# Patient Record
Sex: Female | Born: 1955
Health system: Southern US, Community
[De-identification: ages and names within clinical notes are randomized; demographics above are authoritative.]

## PROBLEM LIST (undated history)

## (undated) DIAGNOSIS — G510 Bell's palsy: Secondary | ICD-10-CM

## (undated) DIAGNOSIS — E785 Hyperlipidemia, unspecified: Secondary | ICD-10-CM

## (undated) DIAGNOSIS — I639 Cerebral infarction, unspecified: Secondary | ICD-10-CM

## (undated) DIAGNOSIS — K8020 Calculus of gallbladder without cholecystitis without obstruction: Secondary | ICD-10-CM

## (undated) DIAGNOSIS — F32A Depression, unspecified: Secondary | ICD-10-CM

## (undated) DIAGNOSIS — M1990 Unspecified osteoarthritis, unspecified site: Secondary | ICD-10-CM

## (undated) DIAGNOSIS — I519 Heart disease, unspecified: Secondary | ICD-10-CM

## (undated) DIAGNOSIS — I10 Essential (primary) hypertension: Secondary | ICD-10-CM

## (undated) DIAGNOSIS — I2510 Atherosclerotic heart disease of native coronary artery without angina pectoris: Secondary | ICD-10-CM

## (undated) DIAGNOSIS — E119 Type 2 diabetes mellitus without complications: Secondary | ICD-10-CM

## (undated) DIAGNOSIS — G459 Transient cerebral ischemic attack, unspecified: Secondary | ICD-10-CM

## (undated) DIAGNOSIS — T7840XA Allergy, unspecified, initial encounter: Secondary | ICD-10-CM

## (undated) HISTORY — PX: CORONARY ANGIOPLASTY WITH STENT PLACEMENT: SHX49

## (undated) HISTORY — DX: Depression, unspecified: F32.A

## (undated) HISTORY — DX: Atherosclerotic heart disease of native coronary artery without angina pectoris: I25.10

## (undated) HISTORY — DX: Hyperlipidemia, unspecified: E78.5

## (undated) HISTORY — DX: Transient cerebral ischemic attack, unspecified: G45.9

## (undated) HISTORY — PX: CHOLECYSTECTOMY: SHX55

## (undated) HISTORY — DX: Heart disease, unspecified: I51.9

## (undated) HISTORY — PX: TONSILLECTOMY: SUR1361

## (undated) HISTORY — DX: Allergy, unspecified, initial encounter: T78.40XA

## (undated) HISTORY — DX: Bell's palsy: G51.0

## (undated) HISTORY — DX: Unspecified osteoarthritis, unspecified site: M19.90

## (undated) HISTORY — DX: Cerebral infarction, unspecified: I63.9

## (undated) HISTORY — DX: Calculus of gallbladder without cholecystitis without obstruction: K80.20

---

## 2016-06-30 DIAGNOSIS — G459 Transient cerebral ischemic attack, unspecified: Secondary | ICD-10-CM

## 2016-06-30 HISTORY — DX: Transient cerebral ischemic attack, unspecified: G45.9

## 2016-10-05 ENCOUNTER — Emergency Department (HOSPITAL_COMMUNITY): Payer: Self-pay

## 2016-10-05 ENCOUNTER — Encounter (HOSPITAL_COMMUNITY): Payer: Self-pay

## 2016-10-05 ENCOUNTER — Inpatient Hospital Stay (HOSPITAL_COMMUNITY): Payer: Self-pay

## 2016-10-05 ENCOUNTER — Inpatient Hospital Stay (HOSPITAL_COMMUNITY)
Admission: EM | Admit: 2016-10-05 | Discharge: 2016-10-07 | DRG: 066 | Disposition: A | Discharge status: Home / Self Care | Payer: Self-pay | Attending: Internal Medicine | Admitting: Internal Medicine

## 2016-10-05 DIAGNOSIS — E118 Type 2 diabetes mellitus with unspecified complications: Secondary | ICD-10-CM | POA: Diagnosis present

## 2016-10-05 DIAGNOSIS — R29701 NIHSS score 1: Secondary | ICD-10-CM | POA: Diagnosis present

## 2016-10-05 DIAGNOSIS — R531 Weakness: Secondary | ICD-10-CM | POA: Diagnosis present

## 2016-10-05 DIAGNOSIS — E669 Obesity, unspecified: Secondary | ICD-10-CM | POA: Diagnosis present

## 2016-10-05 DIAGNOSIS — I10 Essential (primary) hypertension: Secondary | ICD-10-CM | POA: Diagnosis present

## 2016-10-05 DIAGNOSIS — I6523 Occlusion and stenosis of bilateral carotid arteries: Secondary | ICD-10-CM

## 2016-10-05 DIAGNOSIS — I639 Cerebral infarction, unspecified: Secondary | ICD-10-CM | POA: Diagnosis present

## 2016-10-05 DIAGNOSIS — E1165 Type 2 diabetes mellitus with hyperglycemia: Secondary | ICD-10-CM | POA: Diagnosis present

## 2016-10-05 DIAGNOSIS — Z7984 Long term (current) use of oral hypoglycemic drugs: Secondary | ICD-10-CM

## 2016-10-05 DIAGNOSIS — H02402 Unspecified ptosis of left eyelid: Secondary | ICD-10-CM | POA: Diagnosis present

## 2016-10-05 DIAGNOSIS — E1151 Type 2 diabetes mellitus with diabetic peripheral angiopathy without gangrene: Secondary | ICD-10-CM | POA: Diagnosis present

## 2016-10-05 DIAGNOSIS — R270 Ataxia, unspecified: Secondary | ICD-10-CM | POA: Diagnosis present

## 2016-10-05 DIAGNOSIS — E785 Hyperlipidemia, unspecified: Secondary | ICD-10-CM | POA: Diagnosis present

## 2016-10-05 DIAGNOSIS — Z6832 Body mass index (BMI) 32.0-32.9, adult: Secondary | ICD-10-CM

## 2016-10-05 DIAGNOSIS — Z79899 Other long term (current) drug therapy: Secondary | ICD-10-CM

## 2016-10-05 DIAGNOSIS — R200 Anesthesia of skin: Secondary | ICD-10-CM

## 2016-10-05 DIAGNOSIS — Z955 Presence of coronary angioplasty implant and graft: Secondary | ICD-10-CM

## 2016-10-05 DIAGNOSIS — I6329 Cerebral infarction due to unspecified occlusion or stenosis of other precerebral arteries: Principal | ICD-10-CM | POA: Diagnosis present

## 2016-10-05 HISTORY — DX: Essential (primary) hypertension: I10

## 2016-10-05 HISTORY — DX: Type 2 diabetes mellitus without complications: E11.9

## 2016-10-05 LAB — I-STAT CHEM 8, ED
BUN: 14 mg/dL (ref 6–20)
CREATININE: 1.1 mg/dL — AB (ref 0.44–1.00)
Calcium, Ion: 1.1 mmol/L — ABNORMAL LOW (ref 1.15–1.40)
Chloride: 97 mmol/L — ABNORMAL LOW (ref 101–111)
GLUCOSE: 367 mg/dL — AB (ref 65–99)
HCT: 41 % (ref 36.0–46.0)
HEMOGLOBIN: 13.9 g/dL (ref 12.0–15.0)
Potassium: 3.9 mmol/L (ref 3.5–5.1)
Sodium: 136 mmol/L (ref 135–145)
TCO2: 29 mmol/L (ref 0–100)

## 2016-10-05 LAB — COMPREHENSIVE METABOLIC PANEL
ALT: 69 U/L — ABNORMAL HIGH (ref 14–54)
AST: 50 U/L — AB (ref 15–41)
Albumin: 4.2 g/dL (ref 3.5–5.0)
Alkaline Phosphatase: 94 U/L (ref 38–126)
Anion gap: 13 (ref 5–15)
BILIRUBIN TOTAL: 0.5 mg/dL (ref 0.3–1.2)
BUN: 12 mg/dL (ref 6–20)
CO2: 26 mmol/L (ref 22–32)
CREATININE: 1.1 mg/dL — AB (ref 0.44–1.00)
Calcium: 9.7 mg/dL (ref 8.9–10.3)
Chloride: 96 mmol/L — ABNORMAL LOW (ref 101–111)
GFR, EST NON AFRICAN AMERICAN: 53 mL/min — AB (ref >60)
Glucose, Bld: 357 mg/dL — ABNORMAL HIGH (ref 65–99)
POTASSIUM: 3.8 mmol/L (ref 3.5–5.1)
Sodium: 135 mmol/L (ref 135–145)
TOTAL PROTEIN: 7.8 g/dL (ref 6.5–8.1)

## 2016-10-05 LAB — CBC
HEMATOCRIT: 41 % (ref 36.0–46.0)
Hemoglobin: 13.9 g/dL (ref 12.0–15.0)
MCH: 31.7 pg (ref 26.0–34.0)
MCHC: 33.9 g/dL (ref 30.0–36.0)
MCV: 93.4 fL (ref 78.0–100.0)
Platelets: 261 10*3/uL (ref 150–400)
RBC: 4.39 MIL/uL (ref 3.87–5.11)
RDW: 13 % (ref 11.5–15.5)
WBC: 5.8 10*3/uL (ref 4.0–10.5)

## 2016-10-05 LAB — APTT: aPTT: 36 seconds (ref 24–36)

## 2016-10-05 LAB — DIFFERENTIAL
BASOS ABS: 0 10*3/uL (ref 0.0–0.1)
BASOS PCT: 0 %
EOS ABS: 0.2 10*3/uL (ref 0.0–0.7)
Eosinophils Relative: 3 %
LYMPHS ABS: 1.2 10*3/uL (ref 0.7–4.0)
Lymphocytes Relative: 20 %
MONO ABS: 0.3 10*3/uL (ref 0.1–1.0)
MONOS PCT: 6 %
Neutro Abs: 4.1 10*3/uL (ref 1.7–7.7)
Neutrophils Relative %: 71 %

## 2016-10-05 LAB — I-STAT TROPONIN, ED: TROPONIN I, POC: 0 ng/mL (ref 0.00–0.08)

## 2016-10-05 LAB — PROTIME-INR
INR: 1.03
Prothrombin Time: 13.6 seconds (ref 11.4–15.2)

## 2016-10-05 MED ORDER — SENNOSIDES-DOCUSATE SODIUM 8.6-50 MG PO TABS: 1.0000 | ORAL_TABLET | Freq: Every evening | ORAL | Status: DC | PRN

## 2016-10-05 MED ORDER — ACETAMINOPHEN 325 MG PO TABS: 650.0000 mg | ORAL_TABLET | ORAL | Status: DC | PRN

## 2016-10-05 MED ORDER — AMLODIPINE BESYLATE 5 MG PO TABS: 10.0000 mg | ORAL_TABLET | Freq: Every day | ORAL | Status: DC

## 2016-10-05 MED ORDER — SODIUM CHLORIDE 0.9 % IV SOLN
INTRAVENOUS | Status: DC
  Administered 2016-10-06: 02:00:00 via INTRAVENOUS

## 2016-10-05 MED ORDER — IOPAMIDOL (ISOVUE-370) INJECTION 76%
INTRAVENOUS | Status: AC
  Administered 2016-10-05: 50 mL
  Filled 2016-10-05: qty 50

## 2016-10-05 MED ORDER — LABETALOL HCL 5 MG/ML IV SOLN: 20.0000 mg | Freq: Once | INTRAVENOUS | Status: DC

## 2016-10-05 MED ORDER — ENOXAPARIN SODIUM 40 MG/0.4ML ~~LOC~~ SOLN
40.0000 mg | SUBCUTANEOUS | Status: DC
  Administered 2016-10-06 – 2016-10-07 (×2): 40 mg via SUBCUTANEOUS
  Filled 2016-10-05 (×2): qty 0.4

## 2016-10-05 MED ORDER — ASPIRIN 325 MG PO TABS
325.0000 mg | ORAL_TABLET | Freq: Every day | ORAL | Status: DC
  Administered 2016-10-06 – 2016-10-07 (×3): 325 mg via ORAL
  Filled 2016-10-05 (×3): qty 1

## 2016-10-05 MED ORDER — ATENOLOL 50 MG PO TABS
100.0000 mg | ORAL_TABLET | Freq: Every evening | ORAL | Status: DC
  Administered 2016-10-06: 100 mg via ORAL
  Filled 2016-10-05: qty 2
  Filled 2016-10-05: qty 4
  Filled 2016-10-05: qty 2

## 2016-10-05 MED ORDER — VALSARTAN-HYDROCHLOROTHIAZIDE 320-25 MG PO TABS: 1.0000 | ORAL_TABLET | Freq: Every day | ORAL | Status: DC

## 2016-10-05 MED ORDER — INSULIN ASPART 100 UNIT/ML ~~LOC~~ SOLN
0.0000 [IU] | SUBCUTANEOUS | Status: DC
  Administered 2016-10-06 (×2): 5 [IU] via SUBCUTANEOUS

## 2016-10-05 MED ORDER — LORAZEPAM 2 MG/ML IJ SOLN
1.0000 mg | Freq: Once | INTRAMUSCULAR | Status: DC
  Filled 2016-10-05: qty 1

## 2016-10-05 MED ORDER — STROKE: EARLY STAGES OF RECOVERY BOOK
Freq: Once | Status: AC
  Administered 2016-10-06: 02:00:00
  Filled 2016-10-05: qty 1

## 2016-10-05 MED ORDER — ASPIRIN 300 MG RE SUPP: 300.0000 mg | Freq: Every day | RECTAL | Status: DC

## 2016-10-05 MED ORDER — SODIUM CHLORIDE 0.9 % IV BOLUS (SEPSIS)
500.0000 mL | Freq: Once | INTRAVENOUS | Status: AC
  Administered 2016-10-05: 500 mL via INTRAVENOUS

## 2016-10-05 MED ORDER — ACETAMINOPHEN 160 MG/5ML PO SOLN: 650.0000 mg | ORAL | Status: DC | PRN

## 2016-10-05 MED ORDER — ACETAMINOPHEN 650 MG RE SUPP: 650.0000 mg | RECTAL | Status: DC | PRN

## 2016-10-05 MED ORDER — ATORVASTATIN CALCIUM 20 MG PO TABS
20.0000 mg | ORAL_TABLET | Freq: Every day | ORAL | Status: DC
  Administered 2016-10-06: 20 mg via ORAL
  Filled 2016-10-05: qty 2
  Filled 2016-10-05: qty 1

## 2016-10-05 NOTE — ED Provider Notes (Signed)
[facility] DEPT Provider Note   CSN: [id number] Arrival date & time: 10/05/16  1247     History   Chief Complaint Chief Complaint  Patient presents with  . Numbness    HPI Breanna Obrien is a 61 y.o. female.  The history is provided by the patient.  Neurologic Problem   new sudden onset of right-sided numbness that started in the right foot pain gradually ascended to the rest of her right hemibody. This started at 9:00 this morning and has progressively worsened. She reported that it started after she bent over to pick up something from the floor and stood back up. No alleviating or aggravating factors. Denies any associated headache, dizziness, blurry vision, chest pain, shortness of breath, nausea, vomiting. She does endorse difficulty ambulating because of the numbness.  Patient with a history of hypertension and diabetes who's been off of her medicine for several weeks due to relocating from [place].  She reports a history of Bell's palsy resulting in persistent left eye ptosis. No prior strokes.  Past Medical History:  Diagnosis Date  . Diabetes mellitus without complication ([place])   . Hypertension     Patient Active Problem List   Diagnosis Date Noted  . CVA (cerebral vascular accident) ([place]) 10/05/2016    Past Surgical History:  Procedure Laterality Date  . CHOLECYSTECTOMY    . CORONARY ANGIOPLASTY WITH STENT PLACEMENT      OB History    No data available       Home Medications    Prior to Admission medications   Medication Sig Start Date End Date Taking? Authorizing Provider  amLODipine (NORVASC) 10 MG tablet Take 10 mg by mouth daily.   Yes [provider]  atenolol (TENORMIN) 100 MG tablet Take 100 mg by mouth every evening.   Yes [provider]  dapagliflozin propanediol (FARXIGA) 10 MG TABS tablet Take 10 mg by mouth daily.   Yes [provider]  glipiZIDE (GLUCOTROL) 10 MG tablet Take 10 mg by mouth 2 (two) times  daily before a meal.   Yes [provider]  metFORMIN (GLUCOPHAGE) 1000 MG tablet Take 1,000 mg by mouth 2 (two) times daily with a meal.   Yes [provider]  valsartan-hydrochlorothiazide (DIOVAN-HCT) 320-25 MG tablet Take 1 tablet by mouth daily.   Yes [provider]    Family History No family history on file.  Social History Social History  Substance Use Topics  . Smoking status: Not on file  . Smokeless tobacco: Never Used  . Alcohol use Not on file     Allergies   Patient has no known allergies.   Review of Systems Review of Systems   Physical Exam Updated Vital Signs BP (!) 203/89   Pulse 95   Temp 98.2 F (36.8 C) (Oral)   Resp (!) 24   Ht 5\' 3"  (1.6 m)   Wt 186 lb (84.4 kg)   SpO2 95%   BMI 32.95 kg/m   Physical Exam  Constitutional: She is oriented to person, place, and time. She appears well-developed and well-nourished. No distress.  HENT:  Head: Normocephalic and atraumatic.  Nose: Nose normal.  Eyes: Conjunctivae and EOM are normal. Pupils are equal, round, and reactive to light. Right eye exhibits no discharge. Left eye exhibits no discharge. No scleral icterus.  Ptosis of left eye (baseline, due to prior Bell's Palsy)  Neck: Normal range of motion. Neck supple.  Cardiovascular: Normal rate and regular rhythm.  Exam reveals no  gallop and no friction rub.   No murmur heard. Pulmonary/Chest: Effort normal and breath sounds normal. No stridor. No respiratory distress. She has no rales.  Abdominal: Soft. She exhibits no distension. There is no tenderness.  Musculoskeletal: She exhibits no edema or tenderness.  Neurological: She is alert and oriented to person, place, and time.  Mental Status: Alert and oriented to person, place, and time. Attention and concentration normal. Speech clear. Recent memory is intact  Cranial Nerves  II Visual Fields: Intact to confrontation. Visual fields intact. III, IV, VI: Pupils equal and  reactive to light and near. Full eye movement without nystagmus  V Facial Sensation: Normal. No weakness of masticatory muscles  VII: No facial weakness or asymmetry  VIII Auditory Acuity: Grossly normal  IX/X: The uvula is midline; the palate elevates symmetrically  XI: Normal sternocleidomastoid and trapezius strength  XII: The tongue is midline. No atrophy or fasciculations.   Motor System: Muscle Strength: 5/5 and symmetric in the upper and lower extremities. No pronation or drift.  Muscle Tone: Tone and muscle bulk are normal in the upper and lower extremities.   Reflexes: DTRs: 2+ and symmetrical in all four extremities. Plantar responses are flexor bilaterally.  Coordination: mild dysmetria with finger-to-nose. Intact heel-to-shin. No tremor.  Sensation:decreased sensation on right side of body to light touch, and pinprick  Positive Romberg test.  Gait: ataxic   Skin: Skin is warm and dry. No rash noted. She is not diaphoretic. No erythema.  Psychiatric: She has a normal mood and affect.  Vitals reviewed.    ED Treatments / Results  Labs (all labs ordered are listed, but only abnormal results are displayed) Labs Reviewed  COMPREHENSIVE METABOLIC PANEL - Abnormal; Notable for the following:       Result Value   Chloride 96 (*)    Glucose, Bld 357 (*)    Creatinine, Ser 1.10 (*)    AST 50 (*)    ALT 69 (*)    GFR calc non Af Amer 53 (*)    All other components within normal limits  I-STAT CHEM 8, ED - Abnormal; Notable for the following:    Chloride 97 (*)    Creatinine, Ser 1.10 (*)    Glucose, Bld 367 (*)    Calcium, Ion 1.10 (*)    All other components within normal limits  PROTIME-INR  APTT  CBC  DIFFERENTIAL  HEMOGLOBIN A1C  LIPID PANEL  HIV ANTIBODY (ROUTINE TESTING)  CBC  I-STAT TROPOININ, ED  CBG MONITORING, ED    EKG  EKG Interpretation  Date/Time:  Thursday Oct 05 2016 13:07:03 EDT Ventricular Rate:  79 PR Interval:  170 QRS Duration: 92 QT  Interval:  408 QTC Calculation: 467 R Axis:   9 Text Interpretation:  Normal sinus rhythm Abnormal ECG Nonspecific T wave abnormality no stemi    Confirmed by [facility] MD, PEDRO ([id number]) on 10/05/2016 4:29:27 PM       Radiology Ct Angio Head W Or Wo Contrast  Result Date: 10/05/2016 CLINICAL DATA:  Right-sided leg numbness EXAM: CT ANGIOGRAPHY HEAD AND NECK TECHNIQUE: Multidetector CT imaging of the head and neck was performed using the standard protocol during bolus administration of intravenous contrast. Multiplanar CT image reconstructions and MIPs were obtained to evaluate the vascular anatomy. Carotid stenosis measurements (when applicable) are obtained utilizing NASCET criteria, using the distal internal carotid diameter as the denominator. CONTRAST:  50 mL Isovue 370 COMPARISON:  Head CT 10/05/2016 FINDINGS: CTA NECK FINDINGS Aortic arch:  There is no aneurysm or dissection of the visualized ascending aorta or aortic arch. There is a normal 3 vessel branching pattern. The visualized proximal subclavian arteries are normal. There is aortic arch atherosclerosis. Right carotid system: The right common carotid origin is widely patent. There is no common carotid or internal carotid artery dissection or aneurysm. Atherosclerotic calcification of the right carotid bifurcation results in approximately 60% proximal right ICA stenosis. Left carotid system: The origin of the left common carotid artery in the remainder of the common carotid course are normal. At the carotid bifurcation, there is atherosclerotic calcification the results in less than 50% stenosis. No dissection or aneurysm. Vertebral arteries: The vertebral system is codominant. Both vertebral artery origins are normal. Both vertebral arteries are normal to their confluence with the basilar artery. Skeleton: There is no bony spinal canal stenosis. No lytic or blastic lesions. Other neck: The nasopharynx is clear. The oropharynx and hypopharynx are  normal. The epiglottis is normal. The supraglottic larynx, glottis and subglottic larynx are normal. No retropharyngeal collection. The parapharyngeal spaces are preserved. The parotid and submandibular glands are normal. No sialolithiasis or salivary ductal dilatation. The thyroid gland is normal. There is no cervical lymphadenopathy. Upper chest: No pneumothorax or pleural effusion. No nodules or masses. Review of the MIP images confirms the above findings CTA HEAD FINDINGS Anterior circulation: --Intracranial internal carotid arteries: There is atherosclerotic calcification of both lacerum and cavernous segments without advanced stenosis. 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 The distal aspect of the vessel is diminutive and not clearly visualized. --Anterior cerebral arteries: Normal. --Middle cerebral arteries: Normal. --Posterior communicating arteries: There is a small P-comm present on the left. Posterior circulation: --Posterior cerebral arteries: The left posterior cerebral artery is severely diminutive. The right is normal. --Superior cerebellar arteries: Normal on the right. Diminutive on the left. --Basilar artery: Normal. --Anterior inferior cerebellar arteries: Normal. --Posterior inferior cerebellar arteries: Normal. Venous sinuses: As permitted by contrast timing, patent. 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 Delayed phase: No parenchymal contrast enhancement. Review of the MIP images confirms the above findings IMPRESSION: 1. No emergent large vessel occlusion. 2. Approximately 60% stenosis of the proximal right internal carotid artery secondary to atherosclerotic calcification. 3. Very diminutive left posterior cerebral artery arising from a small posterior communicating artery. 4.  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 A diminutive vessel appears to arise from the apex, but connection to the basilar artery is not visualized. Electronically Signed   By: Ulyses Jarred M.D.   On: 10/05/2016 21:02   Ct Head Wo Contrast  Result Date: 10/05/2016 CLINICAL DATA:  Right-sided weakness and numbness. Tingling in the arm and hand. Symptoms began at 9 o'clock a.m. today. EXAM: CT HEAD WITHOUT CONTRAST TECHNIQUE: Contiguous axial images were obtained from the base of the skull through the vertex without intravenous contrast. COMPARISON:  None. FINDINGS: Brain: A remote white matter infarct is noted adjacent to the frontal horn of the right lateral ventricle. This involves the anterior aspect of the corpus callosum as well. No other acute or focal infarct is present. No discrete cortical lesions are noted. The basal ganglia are intact. The insular ribbon is normal. Ventricles are of normal size. No significant  extra-axial fluid collection is present. Vascular: Atherosclerotic calcifications are present within the cavernous internal carotid arteries bilaterally. There is no hyperdense vessel. Skull: The calvarium is intact. No focal lytic or blastic lesions are present. Sinuses/Orbits: The paranasal sinuses and mastoid air cells are clear. The globes and orbits are within normal limits. IMPRESSION: 1. 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 2. Otherwise negative CT of the head. Electronically Signed   By: San Morelle M.D.   On: 10/05/2016 14:42   Ct Angio Neck W Or Wo Contrast  Result Date: 10/05/2016 CLINICAL DATA:  Right-sided leg numbness EXAM: CT ANGIOGRAPHY HEAD AND NECK TECHNIQUE: Multidetector CT imaging of the head and neck was performed using the standard protocol during bolus administration of  intravenous contrast. Multiplanar CT image reconstructions and MIPs were obtained to evaluate the vascular anatomy. Carotid stenosis measurements (when applicable) are obtained utilizing NASCET criteria, using the distal internal carotid diameter as the denominator. CONTRAST:  50 mL Isovue 370 COMPARISON:  Head CT 10/05/2016 FINDINGS: CTA NECK FINDINGS Aortic arch: There is no aneurysm or dissection of the visualized ascending aorta or aortic arch. There is a normal 3 vessel branching pattern. The visualized proximal subclavian arteries are normal. There is aortic arch atherosclerosis. Right carotid system: The right common carotid origin is widely patent. There is no common carotid or internal carotid artery dissection or aneurysm. Atherosclerotic calcification of the right carotid bifurcation results in approximately 60% proximal right ICA stenosis. Left carotid system: The origin of the left common carotid artery in the remainder of the common carotid course are normal. At the carotid bifurcation, there is atherosclerotic calcification the results in less than 50% stenosis. No dissection or aneurysm. Vertebral arteries: The vertebral system is codominant. Both vertebral artery origins are normal. Both vertebral arteries are normal to their confluence with the basilar artery. Skeleton: There is no bony spinal canal stenosis. No lytic or blastic lesions. Other neck: The nasopharynx is clear. The oropharynx and hypopharynx are normal. The epiglottis is normal. The supraglottic larynx, glottis and subglottic larynx are normal. No retropharyngeal collection. The parapharyngeal spaces are preserved. The parotid and submandibular glands are normal. No sialolithiasis or salivary ductal dilatation. The thyroid gland is normal. There is no cervical lymphadenopathy. Upper chest: No pneumothorax or pleural effusion. No nodules or masses. Review of the MIP images confirms the above findings CTA HEAD FINDINGS Anterior  circulation: --Intracranial internal carotid arteries: There is atherosclerotic calcification of both lacerum and cavernous segments without advanced stenosis. 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 The distal aspect of the vessel is diminutive and not clearly visualized. --Anterior cerebral arteries: Normal. --Middle cerebral arteries: Normal. --Posterior communicating arteries: There is a small P-comm present on the left. Posterior circulation: --Posterior cerebral arteries: The left posterior cerebral artery is severely diminutive. The right is normal. --Superior cerebellar arteries: Normal on the right. Diminutive on the left. --Basilar artery: Normal. --Anterior inferior cerebellar arteries: Normal. --Posterior inferior cerebellar arteries: Normal. Venous sinuses: As permitted by contrast timing, patent. 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 Delayed phase: No parenchymal contrast enhancement. Review of the MIP images confirms the above findings IMPRESSION: 1. No emergent large vessel occlusion. 2. Approximately 60% stenosis of the proximal right internal carotid artery secondary  to atherosclerotic calcification. 3. Very diminutive left posterior cerebral artery arising from a small posterior communicating artery. 4. 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 A diminutive vessel appears to arise from the apex, but connection to the basilar artery is not visualized. Electronically Signed   By: Ulyses Jarred M.D.   On: 10/05/2016 21:02    Procedures Procedures (including critical care time)  Medications Ordered in ED Medications  valsartan-hydrochlorothiazide (DIOVAN-HCT) 320-25 MG per tablet 1  tablet (not administered)  amLODipine (NORVASC) tablet 10 mg (not administered)  atenolol (TENORMIN) tablet 100 mg (not administered)   stroke: mapping our early stages of recovery book (not administered)  0.9 %  sodium chloride infusion (not administered)  acetaminophen (TYLENOL) tablet 650 mg (not administered)    Or  acetaminophen (TYLENOL) solution 650 mg (not administered)    Or  acetaminophen (TYLENOL) suppository 650 mg (not administered)  senna-docusate (Senokot-S) tablet 1 tablet (not administered)  enoxaparin (LOVENOX) injection 40 mg (not administered)  aspirin suppository 300 mg (not administered)    Or  aspirin tablet 325 mg (not administered)  atorvastatin (LIPITOR) tablet 20 mg (not administered)  sodium chloride 0.9 % bolus 500 mL (500 mLs Intravenous New Bag/Given 10/05/16 1843)  iopamidol (ISOVUE-370) 76 % injection (50 mLs  Contrast Given 10/05/16 2006)     Initial Impression / Assessment and Plan / ED Course  I have reviewed the triage vital signs and the nursing notes.  Pertinent labs & imaging results that were available during my care of the patient were reviewed by me and considered in my medical decision making (see chart for details).     Concerning for Posterior circulation deficiency vs thalamic stroke. Discussed case with neurology who agreed that given her lack of deficits, a code stroke was not necessary at this time. CT of the patient's head did reveal remote infarct of the frontal horn of the right lateral ventricle. Screening labs with hyperglycemia but no evidence of DKA. Mild renal insufficiency. No electrolyte derangements. Other labs grossly reassuring.  Patient is noted to be hypertensive with systolics in the 062I, that are gradually improving without intervention.  Obtaining a CTA of the head and neck to assess posterior circulation. Neurology recommended MRI.   Patient does not have vertiginous symptoms that would be consistent with BPPV  causing her ataxia.  Admitted to medicine for further work up and management.  Final Clinical Impressions(s) / ED Diagnoses   Final diagnoses:  Ataxia  Numbness     Cardama, Grayce Sessions, MD 10/07/16 [phone]

## 2016-10-05 NOTE — ED Notes (Signed)
Report called to floor. Informed receiving RN that pt is currently in MRI and will be transported when pt returns from MRI.

## 2016-10-05 NOTE — H&P (Addendum)
History and Physical    Breanna Obrien XNA:[id number] DOB: 08-18-1955 DOA: 10/05/2016  Referring MD/NP/PA: Dr. Leonette Monarch PCP: No primary care provider on file.  Patient coming from: Home  Chief Complaint: Right-sided numbness  HPI: Breanna Obrien is a 61 y.o. female with medical history significant of HTN, DM type II, Lt sided bell's palsy; who presents with complaints of right-sided numbness since this morning at approximately 9 AM. Patient reports getting out of bed and after bending over to pick something off the floor feeling as though her right leg was sleep. She's tried stopping the affected leg on the floor without change in symptoms. As time progressed patient noted that her whole right side felt numb which was unusual for her. Associated symptoms included feeling off balance and feeling as though she is limping on her right side. Denies having any significant weakness, slurred speech, headache, change in vision, chest pain, palpitations, lightheadedness, fall, fever, shortness of breath chills, nausea, vomiting, or diarrhea. Patient notes that blood sugars have been intermittently elevated at home into the 190s. While checking her blood pressures at home she noted her systolic blood pressures were elevated into the 170s which is unusual for her.  ED Course: Upon admission to the emergency department patient was noted to have a blood pressures elevated to 203/89. Initial CT imaging did not show any acute signs of stroke. Patient was not a candidate for TPA.  Review of Systems: As per HPI otherwise 10 point review of systems negative.   Past Medical History:  Diagnosis Date  . Diabetes mellitus without complication ([place])   . Hypertension     Past Surgical History:  Procedure Laterality Date  . CHOLECYSTECTOMY    . CORONARY ANGIOPLASTY WITH STENT PLACEMENT       does not have a smoking history on file. She has never used smokeless tobacco. Her alcohol and drug histories are not on  file.  No Known Allergies  No family history on file.  Prior to Admission medications   Medication Sig Start Date End Date Taking? Authorizing Provider  amLODipine (NORVASC) 10 MG tablet Take 10 mg by mouth daily.   Yes [provider]  atenolol (TENORMIN) 100 MG tablet Take 100 mg by mouth every evening.   Yes [provider]  dapagliflozin propanediol (FARXIGA) 10 MG TABS tablet Take 10 mg by mouth daily.   Yes [provider]  glipiZIDE (GLUCOTROL) 10 MG tablet Take 10 mg by mouth 2 (two) times daily before a meal.   Yes [provider]  metFORMIN (GLUCOPHAGE) 1000 MG tablet Take 1,000 mg by mouth 2 (two) times daily with a meal.   Yes [provider]  valsartan-hydrochlorothiazide (DIOVAN-HCT) 320-25 MG tablet Take 1 tablet by mouth daily.   Yes [provider]    Physical Exam:   Constitutional: Overweight female in NAD, calm, comfortable Vitals:   10/05/16 2100 10/05/16 2115 10/05/16 2130 10/05/16 2145  BP: (!) 152/61   (!) 150/64  Pulse: 74 77 78 79  Resp: 16 15 14 17   Temp:      TempSrc:      SpO2: 96% 97% 98% 97%  Weight:      Height:       Eyes: Left eye ptosis. Right eye normal extraocular movements. Sclerae are anicteric. ENMT: Mucous membranes are moist. Posterior pharynx clear of any exudate or lesions.Normal dentition.  Neck: normal, supple, no masses, no thyromegaly Respiratory: clear to auscultation bilaterally, no wheezing, no crackles. Normal respiratory effort.  No accessory muscle use.  Cardiovascular: Regular rate and rhythm, no murmurs / rubs / gallops. No extremity edema. 2+ pedal pulses. No carotid bruits.  Abdomen: no tenderness, no masses palpated. No hepatosplenomegaly. Bowel sounds positive.  Musculoskeletal: no clubbing / cyanosis. No joint deformity upper and lower extremities. Good ROM, no contractures. Normal muscle tone.  Skin: no rashes, lesions, ulcers. No induration Neurologic: CN 2-12  grossly intact. Sensation Abnormal on the right side, DTR normal. Strength 5/5 in all 4. Facial drooping noted on the left with left eye ptosis. Psychiatric: Normal judgment and insight. Alert and oriented x 3. Normal mood.     Labs on Admission: I have personally reviewed following labs and imaging studies  CBC:  Recent Labs Lab 10/05/16 1203 10/05/16 1337  WBC 5.8  --   NEUTROABS 4.1  --   HGB 13.9 13.9  HCT 41.0 41.0  MCV 93.4  --   PLT 261  --    Basic Metabolic Panel:  Recent Labs Lab 10/05/16 1203 10/05/16 1337  NA 135 136  K 3.8 3.9  CL 96* 97*  CO2 26  --   GLUCOSE 357* 367*  BUN 12 14  CREATININE 1.10* 1.10*  CALCIUM 9.7  --    GFR: Estimated Creatinine Clearance: 56 mL/min (A) (by C-G formula based on SCr of 1.1 mg/dL (H)). Liver Function Tests:  Recent Labs Lab 10/05/16 1203  AST 50*  ALT 69*  ALKPHOS 94  BILITOT 0.5  PROT 7.8  ALBUMIN 4.2   No results for input(s): LIPASE, AMYLASE in the last 168 hours. No results for input(s): AMMONIA in the last 168 hours. Coagulation Profile:  Recent Labs Lab 10/05/16 1203  INR 1.03   Cardiac Enzymes: No results for input(s): CKTOTAL, CKMB, CKMBINDEX, TROPONINI in the last 168 hours. BNP (last 3 results) No results for input(s): PROBNP in the last 8760 hours. HbA1C: No results for input(s): HGBA1C in the last 72 hours. CBG: No results for input(s): GLUCAP in the last 168 hours. Lipid Profile: No results for input(s): CHOL, HDL, LDLCALC, TRIG, CHOLHDL, LDLDIRECT in the last 72 hours. Thyroid Function Tests: No results for input(s): TSH, T4TOTAL, FREET4, T3FREE, THYROIDAB in the last 72 hours. Anemia Panel: No results for input(s): VITAMINB12, FOLATE, FERRITIN, TIBC, IRON, RETICCTPCT in the last 72 hours. Urine analysis: No results found for: COLORURINE, APPEARANCEUR, LABSPEC, PHURINE, GLUCOSEU, HGBUR, BILIRUBINUR, KETONESUR, PROTEINUR, UROBILINOGEN, NITRITE, LEUKOCYTESUR Sepsis Labs: No results  found for this or any previous visit (from the past 240 hour(s)).   Radiological Exams on Admission: Ct Angio Head W Or Wo Contrast  Result Date: 10/05/2016 CLINICAL DATA:  Right-sided leg numbness EXAM: CT ANGIOGRAPHY HEAD AND NECK TECHNIQUE: Multidetector CT imaging of the head and neck was performed using the standard protocol during bolus administration of intravenous contrast. Multiplanar CT image reconstructions and MIPs were obtained to evaluate the vascular anatomy. Carotid stenosis measurements (when applicable) are obtained utilizing NASCET criteria, using the distal internal carotid diameter as the denominator. CONTRAST:  50 mL Isovue 370 COMPARISON:  Head CT 10/05/2016 FINDINGS: CTA NECK FINDINGS Aortic arch: There is no aneurysm or dissection of the visualized ascending aorta or aortic arch. There is a normal 3 vessel branching pattern. The visualized proximal subclavian arteries are normal. There is aortic arch atherosclerosis. Right carotid system: The right common carotid origin is widely patent. There is no common carotid or internal carotid artery dissection or aneurysm. Atherosclerotic calcification of the right carotid bifurcation results in approximately 60% proximal  right ICA stenosis. Left carotid system: The origin of the left common carotid artery in the remainder of the common carotid course are normal. At the carotid bifurcation, there is atherosclerotic calcification the results in less than 50% stenosis. No dissection or aneurysm. Vertebral arteries: The vertebral system is codominant. Both vertebral artery origins are normal. Both vertebral arteries are normal to their confluence with the basilar artery. Skeleton: There is no bony spinal canal stenosis. No lytic or blastic lesions. Other neck: The nasopharynx is clear. The oropharynx and hypopharynx are normal. The epiglottis is normal. The supraglottic larynx, glottis and subglottic larynx are normal. No retropharyngeal  collection. The parapharyngeal spaces are preserved. The parotid and submandibular glands are normal. No sialolithiasis or salivary ductal dilatation. The thyroid gland is normal. There is no cervical lymphadenopathy. Upper chest: No pneumothorax or pleural effusion. No nodules or masses. Review of the MIP images confirms the above findings CTA HEAD FINDINGS Anterior circulation: --Intracranial internal carotid arteries: There is atherosclerotic calcification of both lacerum and cavernous segments without advanced stenosis. 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 The distal aspect of the vessel is diminutive and not clearly visualized. --Anterior cerebral arteries: Normal. --Middle cerebral arteries: Normal. --Posterior communicating arteries: There is a small P-comm present on the left. Posterior circulation: --Posterior cerebral arteries: The left posterior cerebral artery is severely diminutive. The right is normal. --Superior cerebellar arteries: Normal on the right. Diminutive on the left. --Basilar artery: Normal. --Anterior inferior cerebellar arteries: Normal. --Posterior inferior cerebellar arteries: Normal. Venous sinuses: As permitted by contrast timing, patent. 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 Delayed phase: No parenchymal contrast enhancement. Review of the MIP images confirms the above findings IMPRESSION: 1. No emergent large vessel occlusion. 2. Approximately 60% stenosis of the proximal right internal carotid artery secondary to atherosclerotic calcification. 3. Very diminutive left posterior cerebral artery arising from a small posterior communicating artery. 4. 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 A diminutive vessel appears to arise from the apex, but connection to the basilar artery is not visualized. Electronically Signed   By: Ulyses Jarred M.D.   On: 10/05/2016 21:02   Ct Head Wo Contrast  Result Date: 10/05/2016 CLINICAL DATA:  Right-sided weakness and numbness. Tingling in the arm and hand. Symptoms began at 9 o'clock a.m. today. EXAM: CT HEAD WITHOUT CONTRAST TECHNIQUE: Contiguous axial images were obtained from the base of the skull through the vertex without intravenous contrast. COMPARISON:  None. FINDINGS: Brain: A remote white matter infarct is noted adjacent to the frontal horn of the right lateral ventricle. This involves the anterior aspect of the corpus callosum as well. No other acute or focal infarct is present. No discrete cortical lesions are noted. The basal ganglia are intact. The insular ribbon is normal. Ventricles are of normal size. No significant extra-axial fluid collection is present. Vascular: Atherosclerotic calcifications are present within the cavernous internal carotid arteries bilaterally. There is no hyperdense vessel. Skull: The calvarium is intact. No focal lytic or blastic lesions are present. Sinuses/Orbits: The paranasal sinuses and mastoid air cells are clear. The globes and orbits are within normal limits. IMPRESSION: 1. 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 2. Otherwise negative CT of the head. Electronically Signed   By: San Morelle M.D.   On: 10/05/2016 14:42   Ct Angio Neck W Or Wo Contrast  Result Date: 10/05/2016 CLINICAL DATA:  Right-sided leg numbness EXAM: CT ANGIOGRAPHY HEAD AND NECK TECHNIQUE: Multidetector CT imaging of the head and neck was performed using the standard protocol during bolus administration of intravenous contrast. Multiplanar CT image reconstructions and MIPs were obtained to evaluate the vascular anatomy. Carotid  stenosis measurements (when applicable) are obtained utilizing NASCET criteria, using the distal internal carotid diameter as the denominator. CONTRAST:  50 mL Isovue 370 COMPARISON:  Head CT 10/05/2016 FINDINGS: CTA NECK FINDINGS Aortic arch: There is no aneurysm or dissection of the visualized ascending aorta or aortic arch. There is a normal 3 vessel branching pattern. The visualized proximal subclavian arteries are normal. There is aortic arch atherosclerosis. Right carotid system: The right common carotid origin is widely patent. There is no common carotid or internal carotid artery dissection or aneurysm. Atherosclerotic calcification of the right carotid bifurcation results in approximately 60% proximal right ICA stenosis. Left carotid system: The origin of the left common carotid artery in the remainder of the common carotid course are normal. At the carotid bifurcation, there is atherosclerotic calcification the results in less than 50% stenosis. No dissection or aneurysm. Vertebral arteries: The vertebral system is codominant. Both vertebral artery origins are normal. Both vertebral arteries are normal to their confluence with the basilar artery. Skeleton: There is no bony spinal canal stenosis. No lytic or blastic lesions. Other neck: The nasopharynx is clear. The oropharynx and hypopharynx are normal. The epiglottis is normal. The supraglottic larynx, glottis and subglottic larynx are normal. No retropharyngeal collection. The parapharyngeal spaces are preserved. The parotid and submandibular glands are normal. No sialolithiasis or salivary ductal dilatation. The thyroid gland is normal. There is no cervical lymphadenopathy. Upper chest: No pneumothorax or pleural effusion. No nodules or masses. Review of the MIP images confirms the above findings CTA HEAD FINDINGS Anterior circulation: --Intracranial internal carotid arteries: There is atherosclerotic calcification of both lacerum and cavernous segments  without advanced stenosis. 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 The distal aspect of the vessel is diminutive and not clearly visualized. --Anterior cerebral arteries: Normal. --Middle cerebral arteries: Normal. --Posterior communicating arteries: There is a small P-comm present on the left. Posterior circulation: --Posterior cerebral arteries: The left posterior cerebral artery is severely diminutive. The right is normal. --Superior cerebellar arteries: Normal on the right. Diminutive on the left. --Basilar artery: Normal. --Anterior inferior cerebellar arteries: Normal. --Posterior inferior cerebellar arteries: Normal. Venous sinuses: As permitted by contrast timing, patent. 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 Delayed phase: No parenchymal contrast enhancement. Review of the MIP images confirms the above findings IMPRESSION: 1. No emergent large vessel occlusion. 2. Approximately 60% stenosis of the proximal right internal carotid artery secondary to atherosclerotic calcification. 3. Very diminutive left posterior cerebral artery arising from a small posterior communicating artery. 4. 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 A diminutive vessel appears to arise from the apex, but connection to the basilar artery is not visualized. Electronically Signed   By: Cletus Gash.D.  On: 10/05/2016 21:02    EKG: Independently reviewed. Normal sinus rhythm  Assessment/Plan Suspected CVA with right-sided numbness: Acute. Patient reports waking up at 9 AM with right-sided numbness denies any significant weakness. - Admit to telemetry bed - Stroke order set initiated - Neuro checks - Check  MRI/MRA Head and neck w/ w/o contrast - PT/OT/  eval and treat - Check echocardiogram - Check Hemoglobin A1c and lipid panel in a.m. - ASA and atorvastatin - Appreciate neurology consultative services, will follow-up for further recommendations - Social work consult   Diabetes mellitus type 2: Poorly controlled initial blood glucose 357 on admission. No hemoglobin A1c on file. - Hypoglycemic protocol - Hold glipizide, Farxigaand metformin - CBGs every 4 hours with moderate sliding scale insulin.   Essential hypertension - Continue amlodipine, atenolol, and Diovan in a.m.    DVT prophylaxis: lovenox Code Status: Full  Family Communication: No family present at bedside Disposition Plan: TBD  Consults called: Neurology  Admission status: Inpatient  Norval Morton MD [facility] Pager [phone]  If 7PM-7AM, please contact night-coverage [email or web address] Password [facility]  10/05/2016, 10:33 PM

## 2016-10-05 NOTE — Consult Note (Signed)
Requesting Physician: Dr. Leonette Monarch Time consult called: 4:30 PM    Chief Complaint: R numbness ( face, arm, and leg)  History obtained from:  Patient     HPI:                                                                                                                                         Breanna Obrien is an 61 y.o. female with PMH significant for HTN, DM prior L Bell's palsy with residual L ptosis and facial drrp who presnted to the hospital complaning of R sided numbness more in the leg but including the etire R side, symptoms started this m,orning around 9AM, pt deni3ed any weakness, no slurred speech, no visual changes. Neurology consult was called to evaluate the pt for possible stroke. In ED [facility] was normal Pt denied headaches, N/V or any pain.  Date last known well: 10/05/2016 Time last known well: 9 AM tPA Given: NO, outside the window, NIHss 1   Modified Rankin: 0    Past Medical History:  Diagnosis Date  . Diabetes mellitus without complication ([place])   . Hypertension     Past Surgical History:  Procedure Laterality Date  . CHOLECYSTECTOMY    . CORONARY ANGIOPLASTY WITH STENT PLACEMENT      No family history on file. Social History:  does not have a smoking history on file. She has never used smokeless tobacco. Her alcohol and drug histories are not on file.  Allergies: No Known Allergies  Medications:                                                                                                                           {medication reviewed by me   ROS:  ROS: as noted in HPI  Neurologic Examination:                                                                                                      Blood pressure (!) 203/89, pulse 95, temperature 98.4 F (36.9 C), resp. rate (!) 24, height 5\' 3"  (1.6 m), weight 84.4 kg  (186 lb), SpO2 95 %.  Physical Exam:  Heart: Normal rate and regular rhythm.  Pulmonary/Chest: CTA Abdominal: Soft. There is no tenderness. There is no rebound and no guarding.   Neuro: NIHss 1 Awake alert oriented X3, follows commands, normal visual fields,L ptosis "old", L facial droop "old", PERRLA, normal speech and language, motor 5/5  Sensory: decreased to all modalities on R no ataxia      Lab Results: Basic Metabolic Panel:  Recent Labs Lab 10/05/16 1203 10/05/16 1337  NA 135 136  K 3.8 3.9  CL 96* 97*  CO2 26  --   GLUCOSE 357* 367*  BUN 12 14  CREATININE 1.10* 1.10*  CALCIUM 9.7  --     Liver Function Tests:  Recent Labs Lab 10/05/16 1203  AST 50*  ALT 69*  ALKPHOS 94  BILITOT 0.5  PROT 7.8  ALBUMIN 4.2   No results for input(s): LIPASE, AMYLASE in the last 168 hours. No results for input(s): AMMONIA in the last 168 hours.  CBC:  Recent Labs Lab 10/05/16 1203 10/05/16 1337  WBC 5.8  --   NEUTROABS 4.1  --   HGB 13.9 13.9  HCT 41.0 41.0  MCV 93.4  --   PLT 261  --     Cardiac Enzymes: No results for input(s): CKTOTAL, CKMB, CKMBINDEX, TROPONINI in the last 168 hours.  Lipid Panel: No results for input(s): CHOL, TRIG, HDL, CHOLHDL, VLDL, LDLCALC in the last 168 hours.  CBG: No results for input(s): GLUCAP in the last 168 hours.  Microbiology: No results found for this or any previous visit.  Coagulation Studies:  Recent Labs  10/05/16 1203  LABPROT 13.6  INR 1.03    Imaging: Ct Head Wo Contrast  Result Date: 10/05/2016 CLINICAL DATA:  Right-sided weakness and numbness. Tingling in the arm and hand. Symptoms began at 9 o'clock a.m. today. EXAM: CT HEAD WITHOUT CONTRAST TECHNIQUE: Contiguous axial images were obtained from the base of the skull through the vertex without intravenous contrast. COMPARISON:  None. FINDINGS: Brain: A remote white matter infarct is noted adjacent to the frontal horn of the right lateral  ventricle. This involves the anterior aspect of the corpus callosum as well. No other acute or focal infarct is present. No discrete cortical lesions are noted. The basal ganglia are intact. The insular ribbon is normal. Ventricles are of normal size. No significant extra-axial fluid collection is present. Vascular: Atherosclerotic calcifications are present within the cavernous internal carotid arteries bilaterally. There is no hyperdense vessel. Skull: The calvarium is intact. No focal lytic or blastic lesions are present. Sinuses/Orbits: The paranasal sinuses and mastoid air cells are clear. The globes and orbits are within normal limits. IMPRESSION: 1. Remote  white matter infarct adjacent to the frontal horn of the right lateral ventricle extends to the corpus callosum. 2. Otherwise negative CT of the head. Electronically Signed   By: San Morelle M.D.   On: 10/05/2016 14:42         Assessment: 61 y.o. 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  -MRI brain, MRA carotid and circle of willis  -ASA now then daily -Lipitor  -stroke work up including TTE, A1C, Lipid profile. -PT/O -DM management  -Will follow    Arnaldo Natal, MD

## 2016-10-05 NOTE — ED Triage Notes (Signed)
Pt presents to the ed with off and on numbness of her entire right side since 0900, no weakness present, denies dizziness, or difficulty speaking.  Currently has numbness only in her right leg. Denies pain, alert and oriented, ambulatory. Consulted with dr Lita Mains, no code stroke called per him.

## 2016-10-05 NOTE — ED Notes (Signed)
Patient transported to MRI 

## 2016-10-06 ENCOUNTER — Inpatient Hospital Stay: Payer: Self-pay

## 2016-10-06 ENCOUNTER — Encounter (HOSPITAL_COMMUNITY): Payer: Self-pay | Admitting: *Deleted

## 2016-10-06 DIAGNOSIS — E118 Type 2 diabetes mellitus with unspecified complications: Secondary | ICD-10-CM | POA: Diagnosis present

## 2016-10-06 DIAGNOSIS — E785 Hyperlipidemia, unspecified: Secondary | ICD-10-CM

## 2016-10-06 DIAGNOSIS — I10 Essential (primary) hypertension: Secondary | ICD-10-CM | POA: Diagnosis present

## 2016-10-06 DIAGNOSIS — I6302 Cerebral infarction due to thrombosis of basilar artery: Secondary | ICD-10-CM

## 2016-10-06 DIAGNOSIS — I6523 Occlusion and stenosis of bilateral carotid arteries: Secondary | ICD-10-CM

## 2016-10-06 DIAGNOSIS — R270 Ataxia, unspecified: Secondary | ICD-10-CM

## 2016-10-06 LAB — GLUCOSE, CAPILLARY
GLUCOSE-CAPILLARY: 142 mg/dL — AB (ref 65–99)
Glucose-Capillary: 238 mg/dL — ABNORMAL HIGH (ref 65–99)
Glucose-Capillary: 265 mg/dL — ABNORMAL HIGH (ref 65–99)
Glucose-Capillary: 294 mg/dL — ABNORMAL HIGH (ref 65–99)
Glucose-Capillary: 144 mg/dL — ABNORMAL HIGH (ref 65–99)

## 2016-10-06 LAB — LIPID PANEL
CHOLESTEROL: 187 mg/dL (ref 0–200)
HDL: 37 mg/dL — ABNORMAL LOW (ref >40)
LDL CALC: 119 mg/dL — AB (ref 0–99)
TRIGLYCERIDES: 155 mg/dL — AB (ref <150)
Total CHOL/HDL Ratio: 5.1 RATIO
VLDL: 31 mg/dL (ref 0–40)

## 2016-10-06 LAB — BASIC METABOLIC PANEL
Anion gap: 10 (ref 5–15)
BUN: 10 mg/dL (ref 6–20)
CHLORIDE: 100 mmol/L — AB (ref 101–111)
CO2: 26 mmol/L (ref 22–32)
Calcium: 9.3 mg/dL (ref 8.9–10.3)
Creatinine, Ser: 0.93 mg/dL (ref 0.44–1.00)
GFR calc Af Amer: >60 mL/min (ref >60)
GFR calc non Af Amer: >60 mL/min (ref >60)
Glucose, Bld: 239 mg/dL — ABNORMAL HIGH (ref 65–99)
POTASSIUM: 3.3 mmol/L — AB (ref 3.5–5.1)
SODIUM: 136 mmol/L (ref 135–145)

## 2016-10-06 LAB — CBC
HEMATOCRIT: 37.2 % (ref 36.0–46.0)
HEMOGLOBIN: 12.1 g/dL (ref 12.0–15.0)
MCH: 30.4 pg (ref 26.0–34.0)
MCHC: 32.5 g/dL (ref 30.0–36.0)
MCV: 93.5 fL (ref 78.0–100.0)
Platelets: 255 10*3/uL (ref 150–400)
RBC: 3.98 MIL/uL (ref 3.87–5.11)
RDW: 13 % (ref 11.5–15.5)
WBC: 7.1 10*3/uL (ref 4.0–10.5)

## 2016-10-06 LAB — HIV ANTIBODY (ROUTINE TESTING W REFLEX): HIV Screen 4th Generation wRfx: NONREACTIVE

## 2016-10-06 MED ORDER — IRBESARTAN 300 MG PO TABS: 300.0000 mg | ORAL_TABLET | Freq: Every day | ORAL | Status: DC

## 2016-10-06 MED ORDER — LIVING WELL WITH DIABETES BOOK
Freq: Once | Status: AC
  Administered 2016-10-06: 10:00:00
  Filled 2016-10-06: qty 1

## 2016-10-06 MED ORDER — INSULIN ASPART 100 UNIT/ML ~~LOC~~ SOLN
0.0000 [IU] | Freq: Three times a day (TID) | SUBCUTANEOUS | Status: DC
  Administered 2016-10-06: 8 [IU] via SUBCUTANEOUS
  Administered 2016-10-06: 2 [IU] via SUBCUTANEOUS
  Administered 2016-10-07: 8 [IU] via SUBCUTANEOUS
  Administered 2016-10-07: 2 [IU] via SUBCUTANEOUS

## 2016-10-06 MED ORDER — POTASSIUM CHLORIDE CRYS ER 20 MEQ PO TBCR
40.0000 meq | EXTENDED_RELEASE_TABLET | ORAL | Status: AC
  Administered 2016-10-06: 40 meq via ORAL
  Filled 2016-10-06: qty 2

## 2016-10-06 MED ORDER — ATORVASTATIN CALCIUM 40 MG PO TABS
40.0000 mg | ORAL_TABLET | Freq: Every day | ORAL | Status: DC
  Administered 2016-10-06: 40 mg via ORAL
  Filled 2016-10-06: qty 1

## 2016-10-06 MED ORDER — GADOBENATE DIMEGLUMINE 529 MG/ML IV SOLN
15.0000 mL | Freq: Once | INTRAVENOUS | Status: AC | PRN
  Administered 2016-10-06: 15 mL via INTRAVENOUS

## 2016-10-06 MED ORDER — CLOPIDOGREL BISULFATE 75 MG PO TABS
75.0000 mg | ORAL_TABLET | Freq: Every day | ORAL | Status: DC
  Administered 2016-10-06 – 2016-10-07 (×2): 75 mg via ORAL
  Filled 2016-10-06 (×2): qty 1

## 2016-10-06 MED ORDER — HYDROCHLOROTHIAZIDE 25 MG PO TABS
25.0000 mg | ORAL_TABLET | Freq: Every day | ORAL | Status: DC
  Administered 2016-10-06: 25 mg via ORAL
  Filled 2016-10-06: qty 1

## 2016-10-06 MED ORDER — INSULIN DETEMIR 100 UNIT/ML ~~LOC~~ SOLN
20.0000 [IU] | Freq: Every day | SUBCUTANEOUS | Status: DC
  Administered 2016-10-06 – 2016-10-07 (×2): 20 [IU] via SUBCUTANEOUS
  Filled 2016-10-06 (×2): qty 0.2

## 2016-10-06 NOTE — Progress Notes (Signed)
[facility] PROGRESS NOTE  Breanna Obrien VOZ:[id number] DOB: 17-May-1956 DOA: 10/05/2016   PCP: Patient, No Pcp Per     Assessment/Plan: Principal Problem:   CVA (cerebral vascular accident) ([place]) Active Problems:   Type 2 diabetes mellitus with complication, without long-term current use of insulin ([place])   Essential hypertension   Ataxia   61 y.o. female with medical history significant of HTN, DM type II, Lt sided bell's palsy; who presents with complaints of right-sided numbness since this morning at approximately 9 AM.   ED Course: Upon admission to the emergency department patient was noted to have a blood pressures elevated to 203/89. Initial CT imaging did not show any acute signs of stroke. Patient was not a candidate for TPA.  Assessment and plan Suspected CVA with right-sided numbness: Acute. Patient reports waking up at 9 AM with right-sided numbness denies any significant weakness. Telemetry  shows normal sinus rhythm - Stroke order set initiated - Neuro checks MRI brain acute/early subacute infarction within the left posterior pons.  CTA -no emergent large vessel occlusion, 60% stenosis of the proximal right ICA - PT/OT/  eval and treat - Check echocardiogram Hemoglobin A1c pending and lipid panel in a.m.-triglycerides 155, LDL 119 Currently on aspirin and Plavix - Appreciate neurology consultative services, will follow-up for further recommendations - Social work consult    Dyslipidemia Increased Lipitor from 20-40 mg a day  Diabetes mellitus type 2: Poorly controlled initial blood glucose 357 on admission. No hemoglobin A1c on file. - Hypoglycemic protocol - Hold glipizide, Farxigaand metformin Continue sliding scale insulin Will start patient on Levemir   Essential hypertension Continue amlodipine, atenolol, and Diovan in a.m Discontinue HCTZ due to electrolyte abnormalities   DVT prophylaxsis Lovenox  Code Status:  Full code    Family Communication: Discussed in detail with the patient, all imaging results, lab results explained to the patient   Disposition Plan:  Complete stroke workup     Consultants:  Neurology  Procedures:  None  Antibiotics: Anti-infectives    None         HPI/Subjective: R sided numbness more in the leg but including the etire R side improving  Objective: Vitals:   10/06/16 0130 10/06/16 0330 10/06/16 0530 10/06/16 0745  BP: (!) 154/74 (!) 111/43 (!) 122/48 (!) 134/58  Pulse: 80 76 70 82  Resp: 18 16 18    Temp: 98.2 F (36.8 C) 97.7 F (36.5 C) 98.2 F (36.8 C)   TempSrc: Oral Oral Oral   SpO2: 99% 97% 96% 99%  Weight: 84 kg (185 lb 1.6 oz)     Height: 5\' 3"  (1.6 m)       Intake/Output Summary (Last 24 hours) at 10/06/16 4259 Last data filed at 10/05/16 2030  Gross per 24 hour  Intake                0 ml  Output             1150 ml  Net            -1150 ml    Exam:  Examination:  General exam: Appears calm and comfortable  Respiratory system: Clear to auscultation. Respiratory effort normal. Cardiovascular system: S1 & S2 heard, RRR. No JVD, murmurs, rubs, gallops or clicks. No pedal edema. Gastrointestinal system: Abdomen is nondistended, soft and nontender. No organomegaly or masses felt. Normal bowel sounds heard. Central nervous system: Alert and oriented. No focal neurological deficits. Extremities: Symmetric 5  x 5 power. Skin: No rashes, lesions or ulcers Psychiatry: Judgement and insight appear normal. Mood & affect appropriate.     Data Reviewed: I have personally reviewed following labs and imaging studies  Micro Results No results found for this or any previous visit (from the past 240 hour(s)).  Radiology Reports Ct Angio Head W Or Wo Contrast  Result Date: 10/05/2016 CLINICAL DATA:  Right-sided leg numbness EXAM: CT ANGIOGRAPHY HEAD AND NECK TECHNIQUE: Multidetector CT imaging of the head and neck was performed using the standard  protocol during bolus administration of intravenous contrast. Multiplanar CT image reconstructions and MIPs were obtained to evaluate the vascular anatomy. Carotid stenosis measurements (when applicable) are obtained utilizing NASCET criteria, using the distal internal carotid diameter as the denominator. CONTRAST:  50 mL Isovue 370 COMPARISON:  Head CT 10/05/2016 FINDINGS: CTA NECK FINDINGS Aortic arch: There is no aneurysm or dissection of the visualized ascending aorta or aortic arch. There is a normal 3 vessel branching pattern. The visualized proximal subclavian arteries are normal. There is aortic arch atherosclerosis. Right carotid system: The right common carotid origin is widely patent. There is no common carotid or internal carotid artery dissection or aneurysm. Atherosclerotic calcification of the right carotid bifurcation results in approximately 60% proximal right ICA stenosis. Left carotid system: The origin of the left common carotid artery in the remainder of the common carotid course are normal. At the carotid bifurcation, there is atherosclerotic calcification the results in less than 50% stenosis. No dissection or aneurysm. Vertebral arteries: The vertebral system is codominant. Both vertebral artery origins are normal. Both vertebral arteries are normal to their confluence with the basilar artery. Skeleton: There is no bony spinal canal stenosis. No lytic or blastic lesions. Other neck: The nasopharynx is clear. The oropharynx and hypopharynx are normal. The epiglottis is normal. The supraglottic larynx, glottis and subglottic larynx are normal. No retropharyngeal collection. The parapharyngeal spaces are preserved. The parotid and submandibular glands are normal. No sialolithiasis or salivary ductal dilatation. The thyroid gland is normal. There is no cervical lymphadenopathy. Upper chest: No pneumothorax or pleural effusion. No nodules or masses. Review of the MIP images confirms the above  findings CTA HEAD FINDINGS Anterior circulation: --Intracranial internal carotid arteries: There is atherosclerotic calcification of both lacerum and cavernous segments without advanced stenosis. 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 The distal aspect of the vessel is diminutive and not clearly visualized. --Anterior cerebral arteries: Normal. --Middle cerebral arteries: Normal. --Posterior communicating arteries: There is a small P-comm present on the left. Posterior circulation: --Posterior cerebral arteries: The left posterior cerebral artery is severely diminutive. The right is normal. --Superior cerebellar arteries: Normal on the right. Diminutive on the left. --Basilar artery: Normal. --Anterior inferior cerebellar arteries: Normal. --Posterior inferior cerebellar arteries: Normal. Venous sinuses: As permitted by contrast timing, patent. 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 Delayed phase: No parenchymal contrast enhancement. Review of the MIP images confirms the above findings IMPRESSION: 1. No emergent large vessel occlusion. 2. Approximately 60% stenosis of the proximal right internal carotid artery secondary to atherosclerotic calcification. 3. Very diminutive left posterior cerebral artery arising from a small posterior communicating artery. 4. 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 A diminutive  vessel appears to arise from the apex, but connection to the basilar artery is not visualized. Electronically Signed   By: Ulyses Jarred M.D.   On: 10/05/2016 21:02   Ct Head Wo Contrast  Result Date: 10/05/2016 CLINICAL DATA:  Right-sided weakness and numbness. Tingling in the arm and hand.  Symptoms began at 9 o'clock a.m. today. EXAM: CT HEAD WITHOUT CONTRAST TECHNIQUE: Contiguous axial images were obtained from the base of the skull through the vertex without intravenous contrast. COMPARISON:  None. FINDINGS: Brain: A remote white matter infarct is noted adjacent to the frontal horn of the right lateral ventricle. This involves the anterior aspect of the corpus callosum as well. No other acute or focal infarct is present. No discrete cortical lesions are noted. The basal ganglia are intact. The insular ribbon is normal. Ventricles are of normal size. No significant extra-axial fluid collection is present. Vascular: Atherosclerotic calcifications are present within the cavernous internal carotid arteries bilaterally. There is no hyperdense vessel. Skull: The calvarium is intact. No focal lytic or blastic lesions are present. Sinuses/Orbits: The paranasal sinuses and mastoid air cells are clear. The globes and orbits are within normal limits. IMPRESSION: 1. 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 2. Otherwise negative CT of the head. Electronically Signed   By: San Morelle M.D.   On: 10/05/2016 14:42   Ct Angio Neck W Or Wo Contrast  Result Date: 10/05/2016 CLINICAL DATA:  Right-sided leg numbness EXAM: CT ANGIOGRAPHY HEAD AND NECK TECHNIQUE: Multidetector CT imaging of the head and neck was performed using the standard protocol during bolus administration of intravenous contrast. Multiplanar CT image reconstructions and MIPs were obtained to evaluate the vascular anatomy. Carotid stenosis measurements (when applicable) are obtained utilizing NASCET criteria, using the distal internal carotid diameter as the denominator. CONTRAST:  50 mL Isovue 370 COMPARISON:  Head CT 10/05/2016 FINDINGS: CTA NECK FINDINGS Aortic arch: There is no aneurysm or dissection of the visualized ascending aorta or aortic arch. There  is a normal 3 vessel branching pattern. The visualized proximal subclavian arteries are normal. There is aortic arch atherosclerosis. Right carotid system: The right common carotid origin is widely patent. There is no common carotid or internal carotid artery dissection or aneurysm. Atherosclerotic calcification of the right carotid bifurcation results in approximately 60% proximal right ICA stenosis. Left carotid system: The origin of the left common carotid artery in the remainder of the common carotid course are normal. At the carotid bifurcation, there is atherosclerotic calcification the results in less than 50% stenosis. No dissection or aneurysm. Vertebral arteries: The vertebral system is codominant. Both vertebral artery origins are normal. Both vertebral arteries are normal to their confluence with the basilar artery. Skeleton: There is no bony spinal canal stenosis. No lytic or blastic lesions. Other neck: The nasopharynx is clear. The oropharynx and hypopharynx are normal. The epiglottis is normal. The supraglottic larynx, glottis and subglottic larynx are normal. No retropharyngeal collection. The parapharyngeal spaces are preserved. The parotid and submandibular glands are normal. No sialolithiasis or salivary ductal dilatation. The thyroid gland is normal. There is no cervical lymphadenopathy. Upper chest: No pneumothorax or pleural effusion. No nodules or masses. Review of the MIP images confirms the above findings CTA HEAD FINDINGS Anterior circulation: --Intracranial internal carotid arteries: There is atherosclerotic calcification of both lacerum and cavernous segments without advanced stenosis. 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 The distal aspect of the vessel is diminutive and not clearly visualized. --Anterior cerebral arteries: Normal. --Middle cerebral  arteries: Normal. --Posterior communicating arteries: There is a small P-comm present on the left. Posterior circulation: --Posterior cerebral arteries: The left posterior cerebral artery is severely diminutive. The right is normal. --Superior cerebellar arteries: Normal on the right. Diminutive on the left. --Basilar artery: Normal. --Anterior inferior cerebellar arteries: Normal. --Posterior inferior cerebellar arteries: Normal. Venous sinuses: As permitted by contrast timing, patent. 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 Delayed phase: No parenchymal contrast enhancement. Review of the MIP images confirms the above findings IMPRESSION: 1. No emergent large vessel occlusion. 2. Approximately 60% stenosis of the proximal right internal carotid artery secondary to atherosclerotic calcification. 3. Very diminutive left posterior cerebral artery arising from a small posterior communicating artery. 4. 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 A diminutive vessel appears to arise from the apex, but connection to the basilar artery is not visualized. Electronically Signed   By: Ulyses Jarred M.D.   On: 10/05/2016 21:02   Mr Jodene Nam Neck W Wo Contrast  Result Date: 10/06/2016 CLINICAL DATA:  61 y/o  F; right-sided numbness. EXAM: MR HEAD WITHOUT CONTRAST MRA HEAD WITHOUT CONTRAST MRA OF THE NECK WITHOUT AND WITH CONTRAST TECHNIQUE: Multiplanar, multiecho pulse sequences of the brain and surrounding structures were obtained without intravenous contrast. Angiographic images of the neck were obtained using MRA technique without and with intravenous contrast. Angiographic images of the head were obtained using MRA technique without intravenous contrast. CONTRAST:  43mL MULTIHANCE GADOBENATE DIMEGLUMINE 529 MG/ML IV SOLN COMPARISON:  CT of the  head and CT angiogram of head and neck dated 10/05/2016. FINDINGS: MR HEAD FINDINGS Brain: 7 mm focus of reduced diffusion within the left posterolateral pons (series 3, image 16) compatible with acute/ early subacute infarction. Few nonspecific foci of T2 FLAIR hyperintense signal abnormality in subcortical and periventricular white matter compatible with mild chronic microvascular ischemic changes. Small chronic infarction in right frontal periventricular white matter. No abnormal susceptibility hypointensity to indicate intracranial hemorrhage. No hydrocephalus or extra-axial collection. Vascular: As below. Skull and upper cervical spine: Normal marrow signal. Sinuses/Orbits: Left posterior ethmoid air cell mucosal thickening. Right maxillary sinus mucous retention cyst. Otherwise no significant abnormal signal of paranasal sinuses or mastoid air cells. Other: None. MRA HEAD FINDINGS Internal carotid arteries: Patent. Left cavernous ICA posterior outpouching seen on CT is not appreciable on MRI. Anterior cerebral arteries:  Patent. Middle cerebral arteries: Patent. Anterior communicating artery: Patent. Posterior communicating arteries:  Patent. Posterior cerebral arteries: Normal right PCA flow signal. Poor flow signal within the left posterior cerebral artery with severe proximal stenosis on CT angiogram of head. Diminutive left P1 segment and prominent left posterior communicating artery. Basilar artery:  Patent. Vertebral arteries:  Patent. No evidence of high-grade stenosis, large vessel occlusion, or aneurysm unless noted above. MRA NECK FINDINGS Aortic arch: Patent. Right common carotid artery: Patent. Right internal carotid artery: Proximal ICA moderate 50-60% stenosis. Right vertebral artery: Patent. Left common carotid artery: Patent. Left Internal carotid artery: Proximal ICA mild 30-40% stenosis. Left Vertebral artery: Patent. There is no evidence of high-grade stenosis, dissection, or aneurysm unless  noted above. IMPRESSION: 1. Subcentimeter focus of acute/early subacute infarction within the left  posterior pons. No acute intracranial hemorrhage. 2. Mild chronic microvascular ischemic changes and mild parenchymal volume loss of the brain. 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 3. Poor flow related signal within the left posterior cerebral artery with diminutive vessel/severe stenosis on same-day CT angiogram of head. 4. Left cavernous ICA outpouching on CT angiogram is not appreciable on MR angiogram. 5. Otherwise patent circle of Willis without evidence for high-grade stenosis, large vessel occlusion, or aneurysm. 6. Right proximal internal carotid artery moderate 50-60% stenosis. Left proximal internal carotid artery mild 30-40% stenosis. Patent codominant vertebral arteries. These results will be called to the ordering clinician or representative by the Radiologist Assistant, and communication documented in the PACS or zVision Dashboard. Electronically Signed   By: Kristine Garbe M.D.   On: 10/06/2016 01:24   Mr Brain Wo Contrast  Result Date: 10/06/2016 CLINICAL DATA:  61 y/o  F; right-sided numbness. EXAM: MR HEAD WITHOUT CONTRAST MRA HEAD WITHOUT CONTRAST MRA OF THE NECK WITHOUT AND WITH CONTRAST TECHNIQUE: Multiplanar, multiecho pulse sequences of the brain and surrounding structures were obtained without intravenous contrast. Angiographic images of the neck were obtained using MRA technique without and with intravenous contrast. Angiographic images of the head were obtained using MRA technique without intravenous contrast. CONTRAST:  17mL MULTIHANCE GADOBENATE DIMEGLUMINE 529 MG/ML IV SOLN COMPARISON:  CT of the head and CT angiogram of head and neck dated 10/05/2016. FINDINGS: MR HEAD FINDINGS Brain: 7 mm focus of reduced diffusion within the left posterolateral pons (series 3, image 16) compatible with acute/ early subacute infarction. Few nonspecific foci of  T2 FLAIR hyperintense signal abnormality in subcortical and periventricular white matter compatible with mild chronic microvascular ischemic changes. Small chronic infarction in right frontal periventricular white matter. No abnormal susceptibility hypointensity to indicate intracranial hemorrhage. No hydrocephalus or extra-axial collection. Vascular: As below. Skull and upper cervical spine: Normal marrow signal. Sinuses/Orbits: Left posterior ethmoid air cell mucosal thickening. Right maxillary sinus mucous retention cyst. Otherwise no significant abnormal signal of paranasal sinuses or mastoid air cells. Other: None. MRA HEAD FINDINGS Internal carotid arteries: Patent. Left cavernous ICA posterior outpouching seen on CT is not appreciable on MRI. Anterior cerebral arteries:  Patent. Middle cerebral arteries: Patent. Anterior communicating artery: Patent. Posterior communicating arteries:  Patent. Posterior cerebral arteries: Normal right PCA flow signal. Poor flow signal within the left posterior cerebral artery with severe proximal stenosis on CT angiogram of head. Diminutive left P1 segment and prominent left posterior communicating artery. Basilar artery:  Patent. Vertebral arteries:  Patent. No evidence of high-grade stenosis, large vessel occlusion, or aneurysm unless noted above. MRA NECK FINDINGS Aortic arch: Patent. Right common carotid artery: Patent. Right internal carotid artery: Proximal ICA moderate 50-60% stenosis. Right vertebral artery: Patent. Left common carotid artery: Patent. Left Internal carotid artery: Proximal ICA mild 30-40% stenosis. Left Vertebral artery: Patent. There is no evidence of high-grade stenosis, dissection, or aneurysm unless noted above. IMPRESSION: 1. Subcentimeter focus of acute/early subacute infarction within the left posterior pons. No acute intracranial hemorrhage. 2. Mild chronic microvascular ischemic changes and mild parenchymal volume loss of the brain. Small  chronic infarct in right frontal periventricular white matter. 3. Poor flow related signal within the left posterior cerebral artery with diminutive vessel/severe stenosis on same-day CT angiogram of head. 4. Left cavernous ICA outpouching on CT angiogram is not appreciable on MR angiogram. 5. Otherwise patent circle of Willis without evidence for high-grade stenosis, large vessel occlusion, or aneurysm. 6. Right proximal internal  carotid artery moderate 50-60% stenosis. Left proximal internal carotid artery mild 30-40% stenosis. Patent codominant vertebral arteries. These results will be called to the ordering clinician or representative by the Radiologist Assistant, and communication documented in the PACS or zVision Dashboard. Electronically Signed   By: Kristine Garbe M.D.   On: 10/06/2016 01:24   Mr Jodene Nam Head/brain IW Cm  Result Date: 10/06/2016 CLINICAL DATA:  61 y/o  F; right-sided numbness. EXAM: MR HEAD WITHOUT CONTRAST MRA HEAD WITHOUT CONTRAST MRA OF THE NECK WITHOUT AND WITH CONTRAST TECHNIQUE: Multiplanar, multiecho pulse sequences of the brain and surrounding structures were obtained without intravenous contrast. Angiographic images of the neck were obtained using MRA technique without and with intravenous contrast. Angiographic images of the head were obtained using MRA technique without intravenous contrast. CONTRAST:  41mL MULTIHANCE GADOBENATE DIMEGLUMINE 529 MG/ML IV SOLN COMPARISON:  CT of the head and CT angiogram of head and neck dated 10/05/2016. FINDINGS: MR HEAD FINDINGS Brain: 7 mm focus of reduced diffusion within the left posterolateral pons (series 3, image 16) compatible with acute/ early subacute infarction. Few nonspecific foci of T2 FLAIR hyperintense signal abnormality in subcortical and periventricular white matter compatible with mild chronic microvascular ischemic changes. Small chronic infarction in right frontal periventricular white matter. No abnormal  susceptibility hypointensity to indicate intracranial hemorrhage. No hydrocephalus or extra-axial collection. Vascular: As below. Skull and upper cervical spine: Normal marrow signal. Sinuses/Orbits: Left posterior ethmoid air cell mucosal thickening. Right maxillary sinus mucous retention cyst. Otherwise no significant abnormal signal of paranasal sinuses or mastoid air cells. Other: None. MRA HEAD FINDINGS Internal carotid arteries: Patent. Left cavernous ICA posterior outpouching seen on CT is not appreciable on MRI. Anterior cerebral arteries:  Patent. Middle cerebral arteries: Patent. Anterior communicating artery: Patent. Posterior communicating arteries:  Patent. Posterior cerebral arteries: Normal right PCA flow signal. Poor flow signal within the left posterior cerebral artery with severe proximal stenosis on CT angiogram of head. Diminutive left P1 segment and prominent left posterior communicating artery. Basilar artery:  Patent. Vertebral arteries:  Patent. No evidence of high-grade stenosis, large vessel occlusion, or aneurysm unless noted above. MRA NECK FINDINGS Aortic arch: Patent. Right common carotid artery: Patent. Right internal carotid artery: Proximal ICA moderate 50-60% stenosis. Right vertebral artery: Patent. Left common carotid artery: Patent. Left Internal carotid artery: Proximal ICA mild 30-40% stenosis. Left Vertebral artery: Patent. There is no evidence of high-grade stenosis, dissection, or aneurysm unless noted above. IMPRESSION: 1. Subcentimeter focus of acute/early subacute infarction within the left posterior pons. No acute intracranial hemorrhage. 2. Mild chronic microvascular ischemic changes and mild parenchymal volume loss of the brain. 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 3. Poor flow related signal within the left posterior cerebral artery with diminutive vessel/severe stenosis on same-day CT angiogram of head. 4. Left cavernous ICA  outpouching on CT angiogram is not appreciable on MR angiogram. 5. Otherwise patent circle of Willis without evidence for high-grade stenosis, large vessel occlusion, or aneurysm. 6. Right proximal internal carotid artery moderate 50-60% stenosis. Left proximal internal carotid artery mild 30-40% stenosis. Patent codominant vertebral arteries. These results will be called to the ordering clinician or representative by the Radiologist Assistant, and communication documented in the PACS or zVision Dashboard. Electronically Signed   By: Kristine Garbe M.D.   On: 10/06/2016 01:24     CBC  Recent Labs Lab 10/05/16 1203 10/05/16 1337 10/06/16 0333  WBC 5.8  --  7.1  HGB 13.9 13.9 12.1  HCT 41.0 41.0 37.2  PLT 261  --  255  MCV 93.4  --  93.5  MCH 31.7  --  30.4  MCHC 33.9  --  32.5  RDW 13.0  --  13.0  LYMPHSABS 1.2  --   --   MONOABS 0.3  --   --   EOSABS 0.2  --   --   BASOSABS 0.0  --   --     Chemistries   Recent Labs Lab 10/05/16 1203 10/05/16 1337 10/06/16 0333  NA 135 136 136  K 3.8 3.9 3.3*  CL 96* 97* 100*  CO2 26  --  26  GLUCOSE 357* 367* 239*  BUN 12 14 10   CREATININE 1.10* 1.10* 0.93  CALCIUM 9.7  --  9.3  AST 50*  --   --   ALT 69*  --   --   ALKPHOS 94  --   --   BILITOT 0.5  --   --    ------------------------------------------------------------------------------------------------------------------ estimated creatinine clearance is 66 mL/min (by C-G formula based on SCr of 0.93 mg/dL). ------------------------------------------------------------------------------------------------------------------ No results for input(s): HGBA1C in the last 72 hours. ------------------------------------------------------------------------------------------------------------------  Recent Labs  10/06/16 0333  CHOL 187  HDL 37*  LDLCALC 119*  TRIG 155*  CHOLHDL 5.1    ------------------------------------------------------------------------------------------------------------------ No results for input(s): TSH, T4TOTAL, T3FREE, THYROIDAB in the last 72 hours.  Invalid input(s): FREET3 ------------------------------------------------------------------------------------------------------------------ No results for input(s): VITAMINB12, FOLATE, FERRITIN, TIBC, IRON, RETICCTPCT in the last 72 hours.  Coagulation profile  Recent Labs Lab 10/05/16 1203  INR 1.03    No results for input(s): DDIMER in the last 72 hours.  Cardiac Enzymes No results for input(s): CKMB, TROPONINI, MYOGLOBIN in the last 168 hours.  Invalid input(s): CK ------------------------------------------------------------------------------------------------------------------ Invalid input(s): POCBNP   CBG:  Recent Labs Lab 10/06/16 0401 10/06/16 0914  GLUCAP 238* 265*       Studies: Ct Angio Head W Or Wo Contrast  Result Date: 10/05/2016 CLINICAL DATA:  Right-sided leg numbness EXAM: CT ANGIOGRAPHY HEAD AND NECK TECHNIQUE: Multidetector CT imaging of the head and neck was performed using the standard protocol during bolus administration of intravenous contrast. Multiplanar CT image reconstructions and MIPs were obtained to evaluate the vascular anatomy. Carotid stenosis measurements (when applicable) are obtained utilizing NASCET criteria, using the distal internal carotid diameter as the denominator. CONTRAST:  50 mL Isovue 370 COMPARISON:  Head CT 10/05/2016 FINDINGS: CTA NECK FINDINGS Aortic arch: There is no aneurysm or dissection of the visualized ascending aorta or aortic arch. There is a normal 3 vessel branching pattern. The visualized proximal subclavian arteries are normal. There is aortic arch atherosclerosis. Right carotid system: The right common carotid origin is widely patent. There is no common carotid or internal carotid artery dissection or aneurysm.  Atherosclerotic calcification of the right carotid bifurcation results in approximately 60% proximal right ICA stenosis. Left carotid system: The origin of the left common carotid artery in the remainder of the common carotid course are normal. At the carotid bifurcation, there is atherosclerotic calcification the results in less than 50% stenosis. No dissection or aneurysm. Vertebral arteries: The vertebral system is codominant. Both vertebral artery origins are normal. Both vertebral arteries are normal to their confluence with the basilar artery. Skeleton: There is no bony spinal canal stenosis. No lytic or blastic lesions. Other neck: The nasopharynx is clear. The oropharynx and hypopharynx are normal. The epiglottis is normal. The supraglottic larynx, glottis and subglottic larynx are normal. No retropharyngeal collection.  The parapharyngeal spaces are preserved. The parotid and submandibular glands are normal. No sialolithiasis or salivary ductal dilatation. The thyroid gland is normal. There is no cervical lymphadenopathy. Upper chest: No pneumothorax or pleural effusion. No nodules or masses. Review of the MIP images confirms the above findings CTA HEAD FINDINGS Anterior circulation: --Intracranial internal carotid arteries: There is atherosclerotic calcification of both lacerum and cavernous segments without advanced stenosis. 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 The distal aspect of the vessel is diminutive and not clearly visualized. --Anterior cerebral arteries: Normal. --Middle cerebral arteries: Normal. --Posterior communicating arteries: There is a small P-comm present on the left. Posterior circulation: --Posterior cerebral arteries: The left posterior cerebral artery is severely diminutive. The right is normal. --Superior cerebellar arteries: Normal on the right.  Diminutive on the left. --Basilar artery: Normal. --Anterior inferior cerebellar arteries: Normal. --Posterior inferior cerebellar arteries: Normal. Venous sinuses: As permitted by contrast timing, patent. 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 Delayed phase: No parenchymal contrast enhancement. Review of the MIP images confirms the above findings IMPRESSION: 1. No emergent large vessel occlusion. 2. Approximately 60% stenosis of the proximal right internal carotid artery secondary to atherosclerotic calcification. 3. Very diminutive left posterior cerebral artery arising from a small posterior communicating artery. 4. 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 A diminutive vessel appears to arise from the apex, but connection to the basilar artery is not visualized. Electronically Signed   By: Ulyses Jarred M.D.   On: 10/05/2016 21:02   Ct Head Wo Contrast  Result Date: 10/05/2016 CLINICAL DATA:  Right-sided weakness and numbness. Tingling in the arm and hand. Symptoms began at 9 o'clock a.m. today. EXAM: CT HEAD WITHOUT CONTRAST TECHNIQUE: Contiguous axial images were obtained from the base of the skull through the vertex without intravenous contrast. COMPARISON:  None. FINDINGS: Brain: A remote white matter infarct is noted adjacent to the frontal horn of the right lateral ventricle. This involves the anterior aspect of the corpus callosum as well. No other acute or focal infarct is present. No discrete cortical lesions are noted. The basal ganglia are intact. The insular ribbon is normal. Ventricles are of normal size. No significant extra-axial fluid collection is present. Vascular: Atherosclerotic calcifications are present within the cavernous internal carotid arteries bilaterally. There is no hyperdense vessel. Skull: The  calvarium is intact. No focal lytic or blastic lesions are present. Sinuses/Orbits: The paranasal sinuses and mastoid air cells are clear. The globes and orbits are within normal limits. IMPRESSION: 1. 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 2. Otherwise negative CT of the head. Electronically Signed   By: San Morelle M.D.   On: 10/05/2016 14:42   Ct Angio Neck W Or Wo Contrast  Result Date: 10/05/2016 CLINICAL DATA:  Right-sided leg numbness EXAM: CT ANGIOGRAPHY HEAD AND NECK TECHNIQUE: Multidetector CT imaging of the head and neck was performed using the standard protocol during bolus administration of intravenous contrast. Multiplanar CT image reconstructions and MIPs were obtained to evaluate the vascular anatomy. Carotid stenosis measurements (when applicable) are obtained utilizing NASCET criteria, using the distal internal carotid diameter as the denominator. CONTRAST:  50 mL Isovue  370 COMPARISON:  Head CT 10/05/2016 FINDINGS: CTA NECK FINDINGS Aortic arch: There is no aneurysm or dissection of the visualized ascending aorta or aortic arch. There is a normal 3 vessel branching pattern. The visualized proximal subclavian arteries are normal. There is aortic arch atherosclerosis. Right carotid system: The right common carotid origin is widely patent. There is no common carotid or internal carotid artery dissection or aneurysm. Atherosclerotic calcification of the right carotid bifurcation results in approximately 60% proximal right ICA stenosis. Left carotid system: The origin of the left common carotid artery in the remainder of the common carotid course are normal. At the carotid bifurcation, there is atherosclerotic calcification the results in less than 50% stenosis. No dissection or aneurysm. Vertebral arteries: The vertebral system is codominant. Both vertebral artery origins are normal. Both vertebral arteries  are normal to their confluence with the basilar artery. Skeleton: There is no bony spinal canal stenosis. No lytic or blastic lesions. Other neck: The nasopharynx is clear. The oropharynx and hypopharynx are normal. The epiglottis is normal. The supraglottic larynx, glottis and subglottic larynx are normal. No retropharyngeal collection. The parapharyngeal spaces are preserved. The parotid and submandibular glands are normal. No sialolithiasis or salivary ductal dilatation. The thyroid gland is normal. There is no cervical lymphadenopathy. Upper chest: No pneumothorax or pleural effusion. No nodules or masses. Review of the MIP images confirms the above findings CTA HEAD FINDINGS Anterior circulation: --Intracranial internal carotid arteries: There is atherosclerotic calcification of both lacerum and cavernous segments without advanced stenosis. 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 The distal aspect of the vessel is diminutive and not clearly visualized. --Anterior cerebral arteries: Normal. --Middle cerebral arteries: Normal. --Posterior communicating arteries: There is a small P-comm present on the left. Posterior circulation: --Posterior cerebral arteries: The left posterior cerebral artery is severely diminutive. The right is normal. --Superior cerebellar arteries: Normal on the right. Diminutive on the left. --Basilar artery: Normal. --Anterior inferior cerebellar arteries: Normal. --Posterior inferior cerebellar arteries: Normal. Venous sinuses: As permitted by contrast timing, patent. 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 Delayed phase: No parenchymal contrast enhancement. Review of the MIP images confirms the above findings IMPRESSION: 1. No emergent large vessel occlusion. 2. Approximately  60% stenosis of the proximal right internal carotid artery secondary to atherosclerotic calcification. 3. Very diminutive left posterior cerebral artery arising from a small posterior communicating artery. 4. 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 A diminutive vessel appears to arise from the apex, but connection to the basilar artery is not visualized. Electronically Signed   By: Ulyses Jarred M.D.   On: 10/05/2016 21:02   Mr Jodene Nam Neck W Wo Contrast  Result Date: 10/06/2016 CLINICAL DATA:  61 y/o  F; right-sided numbness. EXAM: MR HEAD WITHOUT CONTRAST MRA HEAD WITHOUT CONTRAST MRA OF THE NECK WITHOUT AND WITH CONTRAST TECHNIQUE: Multiplanar, multiecho pulse sequences of the brain and surrounding structures were obtained without intravenous contrast. Angiographic images of the neck were obtained using MRA technique without and with intravenous contrast. Angiographic images of the head were obtained using MRA technique without intravenous contrast. CONTRAST:  56mL MULTIHANCE GADOBENATE DIMEGLUMINE 529 MG/ML IV SOLN COMPARISON:  CT of the head and CT angiogram of head and neck  dated 10/05/2016. FINDINGS: MR HEAD FINDINGS Brain: 7 mm focus of reduced diffusion within the left posterolateral pons (series 3, image 16) compatible with acute/ early subacute infarction. Few nonspecific foci of T2 FLAIR hyperintense signal abnormality in subcortical and periventricular white matter compatible with mild chronic microvascular ischemic changes. Small chronic infarction in right frontal periventricular white matter. No abnormal susceptibility hypointensity to indicate intracranial hemorrhage. No hydrocephalus or extra-axial collection. Vascular: As below. Skull and upper cervical spine: Normal marrow signal. Sinuses/Orbits: Left posterior ethmoid air cell mucosal thickening. Right maxillary sinus mucous retention cyst.  Otherwise no significant abnormal signal of paranasal sinuses or mastoid air cells. Other: None. MRA HEAD FINDINGS Internal carotid arteries: Patent. Left cavernous ICA posterior outpouching seen on CT is not appreciable on MRI. Anterior cerebral arteries:  Patent. Middle cerebral arteries: Patent. Anterior communicating artery: Patent. Posterior communicating arteries:  Patent. Posterior cerebral arteries: Normal right PCA flow signal. Poor flow signal within the left posterior cerebral artery with severe proximal stenosis on CT angiogram of head. Diminutive left P1 segment and prominent left posterior communicating artery. Basilar artery:  Patent. Vertebral arteries:  Patent. No evidence of high-grade stenosis, large vessel occlusion, or aneurysm unless noted above. MRA NECK FINDINGS Aortic arch: Patent. Right common carotid artery: Patent. Right internal carotid artery: Proximal ICA moderate 50-60% stenosis. Right vertebral artery: Patent. Left common carotid artery: Patent. Left Internal carotid artery: Proximal ICA mild 30-40% stenosis. Left Vertebral artery: Patent. There is no evidence of high-grade stenosis, dissection, or aneurysm unless noted above. IMPRESSION: 1. Subcentimeter focus of acute/early subacute infarction within the left posterior pons. No acute intracranial hemorrhage. 2. Mild chronic microvascular ischemic changes and mild parenchymal volume loss of the brain. 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 3. Poor flow related signal within the left posterior cerebral artery with diminutive vessel/severe stenosis on same-day CT angiogram of head. 4. Left cavernous ICA outpouching on CT angiogram is not appreciable on MR angiogram. 5. Otherwise patent circle of Willis without evidence for high-grade stenosis, large vessel occlusion, or aneurysm. 6. Right proximal internal carotid artery moderate 50-60% stenosis. Left proximal internal carotid artery mild 30-40% stenosis.  Patent codominant vertebral arteries. These results will be called to the ordering clinician or representative by the Radiologist Assistant, and communication documented in the PACS or zVision Dashboard. Electronically Signed   By: Kristine Garbe M.D.   On: 10/06/2016 01:24   Mr Brain Wo Contrast  Result Date: 10/06/2016 CLINICAL DATA:  61 y/o  F; right-sided numbness. EXAM: MR HEAD WITHOUT CONTRAST MRA HEAD WITHOUT CONTRAST MRA OF THE NECK WITHOUT AND WITH CONTRAST TECHNIQUE: Multiplanar, multiecho pulse sequences of the brain and surrounding structures were obtained without intravenous contrast. Angiographic images of the neck were obtained using MRA technique without and with intravenous contrast. Angiographic images of the head were obtained using MRA technique without intravenous contrast. CONTRAST:  29mL MULTIHANCE GADOBENATE DIMEGLUMINE 529 MG/ML IV SOLN COMPARISON:  CT of the head and CT angiogram of head and neck dated 10/05/2016. FINDINGS: MR HEAD FINDINGS Brain: 7 mm focus of reduced diffusion within the left posterolateral pons (series 3, image 16) compatible with acute/ early subacute infarction. Few nonspecific foci of T2 FLAIR hyperintense signal abnormality in subcortical and periventricular white matter compatible with mild chronic microvascular ischemic changes. Small chronic infarction in right frontal periventricular white matter. No abnormal susceptibility hypointensity to indicate intracranial hemorrhage. No hydrocephalus or extra-axial collection. Vascular: As below. Skull and upper cervical spine: Normal marrow signal. Sinuses/Orbits:  Left posterior ethmoid air cell mucosal thickening. Right maxillary sinus mucous retention cyst. Otherwise no significant abnormal signal of paranasal sinuses or mastoid air cells. Other: None. MRA HEAD FINDINGS Internal carotid arteries: Patent. Left cavernous ICA posterior outpouching seen on CT is not appreciable on MRI. Anterior cerebral  arteries:  Patent. Middle cerebral arteries: Patent. Anterior communicating artery: Patent. Posterior communicating arteries:  Patent. Posterior cerebral arteries: Normal right PCA flow signal. Poor flow signal within the left posterior cerebral artery with severe proximal stenosis on CT angiogram of head. Diminutive left P1 segment and prominent left posterior communicating artery. Basilar artery:  Patent. Vertebral arteries:  Patent. No evidence of high-grade stenosis, large vessel occlusion, or aneurysm unless noted above. MRA NECK FINDINGS Aortic arch: Patent. Right common carotid artery: Patent. Right internal carotid artery: Proximal ICA moderate 50-60% stenosis. Right vertebral artery: Patent. Left common carotid artery: Patent. Left Internal carotid artery: Proximal ICA mild 30-40% stenosis. Left Vertebral artery: Patent. There is no evidence of high-grade stenosis, dissection, or aneurysm unless noted above. IMPRESSION: 1. Subcentimeter focus of acute/early subacute infarction within the left posterior pons. No acute intracranial hemorrhage. 2. Mild chronic microvascular ischemic changes and mild parenchymal volume loss of the brain. 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 3. Poor flow related signal within the left posterior cerebral artery with diminutive vessel/severe stenosis on same-day CT angiogram of head. 4. Left cavernous ICA outpouching on CT angiogram is not appreciable on MR angiogram. 5. Otherwise patent circle of Willis without evidence for high-grade stenosis, large vessel occlusion, or aneurysm. 6. Right proximal internal carotid artery moderate 50-60% stenosis. Left proximal internal carotid artery mild 30-40% stenosis. Patent codominant vertebral arteries. These results will be called to the ordering clinician or representative by the Radiologist Assistant, and communication documented in the PACS or zVision Dashboard. Electronically Signed   By: Kristine Garbe M.D.   On: 10/06/2016 01:24   Mr Jodene Nam Head/brain ON Cm  Result Date: 10/06/2016 CLINICAL DATA:  61 y/o  F; right-sided numbness. EXAM: MR HEAD WITHOUT CONTRAST MRA HEAD WITHOUT CONTRAST MRA OF THE NECK WITHOUT AND WITH CONTRAST TECHNIQUE: Multiplanar, multiecho pulse sequences of the brain and surrounding structures were obtained without intravenous contrast. Angiographic images of the neck were obtained using MRA technique without and with intravenous contrast. Angiographic images of the head were obtained using MRA technique without intravenous contrast. CONTRAST:  81mL MULTIHANCE GADOBENATE DIMEGLUMINE 529 MG/ML IV SOLN COMPARISON:  CT of the head and CT angiogram of head and neck dated 10/05/2016. FINDINGS: MR HEAD FINDINGS Brain: 7 mm focus of reduced diffusion within the left posterolateral pons (series 3, image 16) compatible with acute/ early subacute infarction. Few nonspecific foci of T2 FLAIR hyperintense signal abnormality in subcortical and periventricular white matter compatible with mild chronic microvascular ischemic changes. Small chronic infarction in right frontal periventricular white matter. No abnormal susceptibility hypointensity to indicate intracranial hemorrhage. No hydrocephalus or extra-axial collection. Vascular: As below. Skull and upper cervical spine: Normal marrow signal. Sinuses/Orbits: Left posterior ethmoid air cell mucosal thickening. Right maxillary sinus mucous retention cyst. Otherwise no significant abnormal signal of paranasal sinuses or mastoid air cells. Other: None. MRA HEAD FINDINGS Internal carotid arteries: Patent. Left cavernous ICA posterior outpouching seen on CT is not appreciable on MRI. Anterior cerebral arteries:  Patent. Middle cerebral arteries: Patent. Anterior communicating artery: Patent. Posterior communicating arteries:  Patent. Posterior cerebral arteries: Normal right PCA flow signal. Poor flow signal within the left posterior  cerebral artery  with severe proximal stenosis on CT angiogram of head. Diminutive left P1 segment and prominent left posterior communicating artery. Basilar artery:  Patent. Vertebral arteries:  Patent. No evidence of high-grade stenosis, large vessel occlusion, or aneurysm unless noted above. MRA NECK FINDINGS Aortic arch: Patent. Right common carotid artery: Patent. Right internal carotid artery: Proximal ICA moderate 50-60% stenosis. Right vertebral artery: Patent. Left common carotid artery: Patent. Left Internal carotid artery: Proximal ICA mild 30-40% stenosis. Left Vertebral artery: Patent. There is no evidence of high-grade stenosis, dissection, or aneurysm unless noted above. IMPRESSION: 1. Subcentimeter focus of acute/early subacute infarction within the left posterior pons. No acute intracranial hemorrhage. 2. Mild chronic microvascular ischemic changes and mild parenchymal volume loss of the brain. 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 3. Poor flow related signal within the left posterior cerebral artery with diminutive vessel/severe stenosis on same-day CT angiogram of head. 4. Left cavernous ICA outpouching on CT angiogram is not appreciable on MR angiogram. 5. Otherwise patent circle of Willis without evidence for high-grade stenosis, large vessel occlusion, or aneurysm. 6. Right proximal internal carotid artery moderate 50-60% stenosis. Left proximal internal carotid artery mild 30-40% stenosis. Patent codominant vertebral arteries. These results will be called to the ordering clinician or representative by the Radiologist Assistant, and communication documented in the PACS or zVision Dashboard. Electronically Signed   By: Kristine Garbe M.D.   On: 10/06/2016 01:24      No results found for: HGBA1C Lab Results  Component Value Date   LDLCALC 119 (H) 10/06/2016   CREATININE 0.93 10/06/2016       Scheduled Meds: . aspirin  300 mg Rectal Daily    Or  . aspirin  325 mg Oral Daily  . atenolol  100 mg Oral QPM  . atorvastatin  20 mg Oral q1800  . clopidogrel  75 mg Oral Daily  . enoxaparin (LOVENOX) injection  40 mg Subcutaneous Q24H  . hydrochlorothiazide  25 mg Oral Daily  . insulin aspart  0-15 Units Subcutaneous Q4H  . living well with diabetes book   Does not apply Once  . LORazepam  1 mg Intravenous Once   Continuous Infusions: . sodium chloride 75 mL/hr at 10/06/16 0228     LOS: 1 day    Time spent: >30 MINS    Reyne Dumas  [facility] Pager [phone]. If 7PM-7AM, please contact night-coverage at [email or web address], password [facility] 10/06/2016, 9:38 AM  LOS: 1 day

## 2016-10-06 NOTE — Evaluation (Signed)
Occupational Therapy Evaluation and Discharge Patient Details Name: Breanna Obrien MRN: [id number] DOB: 12/17/1955 Today's Date: 10/06/2016    History of Present Illness Breanna Obrien is an 61 y.o. female with PMH significant for HTN, DM prior L Bell's palsy with residual L ptosis and facial droop who presnted to the hospital complaning of R sided numbness more in the leg but including the etire R side. MRI revealed acute/early subacute infarction within the left posterior pons. 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   Clinical Impression   PTA Pt independent in ADL/IADL and mobility. Pt currently at supervision level for ADL and mobility within room. Right hand is able to perform all motor tasks requested (gross motor, grasp, finger dexterity, writing, operating phone) and abel to detect light touch - but Pt still reporting pins and needles. No questions or concerns for OT at the end of session, and OT education complete. OT to sign off at this time. Should her situation change or deficits become more pronounced feel free to re-order. Thank you for this referral.     Follow Up Recommendations  No OT follow up    Equipment Recommendations  None recommended by OT    Recommendations for Other Services       Precautions / Restrictions Precautions Precautions: Fall Restrictions Weight Bearing Restrictions: No      Mobility Bed Mobility Overal bed mobility: Modified Independent                Transfers Overall transfer level: Needs assistance Equipment used: None Transfers: Sit to/from Stand Sit to Stand: Supervision         General transfer comment: increased time, supervision for safety    Balance Overall balance assessment: Needs assistance Sitting-balance support: Feet supported Sitting balance-Leahy Scale: Good     Standing balance support: During functional activity;No upper extremity supported Standing balance-Leahy Scale:  Fair                             ADL either performed or assessed with clinical judgement   ADL Overall ADL's : Needs assistance/impaired Eating/Feeding: Modified independent;Sitting   Grooming: Wash/dry hands;Wash/dry face;Oral care;Supervision/safety;Standing Grooming Details (indicate cue type and reason): sink level grooming Upper Body Bathing: Supervision/ safety;Standing   Lower Body Bathing: Supervison/ safety   Upper Body Dressing : Modified independent;Sitting   Lower Body Dressing: Supervision/safety   Toilet Transfer: Supervision/safety;Ambulation;Regular Toilet   Toileting- Water quality scientist and Hygiene: Supervision/safety   Tub/ Shower Transfer: Tub transfer;Supervision/safety;Ambulation Tub/Shower Transfer Details (indicate cue type and reason): used wall for support getting over tub edge Functional mobility during ADLs: Supervision/safety       Vision Baseline Vision/History: Wears glasses Wears Glasses: At all times Patient Visual Report: No change from baseline Additional Comments: baseline Ptosis on L eye     Perception     Praxis      Pertinent Vitals/Pain Pain Assessment: No/denies pain     Hand Dominance Right   Extremity/Trunk Assessment Upper Extremity Assessment Upper Extremity Assessment: RUE deficits/detail;Generalized weakness RUE Deficits / Details: Pt reports tingling in R hand, but completely functional with full grasp and extension RUE Sensation: decreased light touch (Able to close eyes and tell which finger is being touched)   Lower Extremity Assessment Lower Extremity Assessment: Defer to PT evaluation   Cervical / Trunk Assessment Cervical / Trunk Assessment: Normal   Communication Communication Communication: No difficulties   Cognition Arousal/Alertness: Awake/alert Behavior During Therapy:  WFL for tasks assessed/performed Overall Cognitive Status: Within Functional Limits for tasks assessed                                      General Comments  Pt recently moved here from [place] expects to be discharged to:: Private residence Living Arrangements: Spouse/significant other Available Help at Discharge: Family;Available PRN/intermittently Type of Home: House Home Access: Stairs to enter [employer] of Steps: 2 Entrance Stairs-Rails: None Home Layout: Two level;Able to live on main level with bedroom/bathroom Alternate Level Stairs-Number of Steps: flight Alternate Level Stairs-Rails: Right Bathroom Shower/Tub: Teacher, early years/pre: Standard Bathroom Accessibility: Yes How Accessible: Accessible via walker Home Equipment: None   Additional Comments: 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 with extensive special needs      Prior Functioning/Environment Level of Independence: Independent        Comments: pt still working full-time in childcare        OT Problem List: Impaired balance (sitting and/or standing);Impaired sensation      OT Treatment/Interventions:      OT Goals(Current goals can be found in the care plan section) Acute Rehab OT Goals Patient Stated Goal: return home and back to independence OT Goal Formulation: With patient Time For Goal Achievement: 10/20/16 Potential to Achieve Goals: Good  OT Frequency:     Barriers to D/C:            Co-evaluation              AM-PAC PT "6 Clicks" Daily Activity     Outcome Measure Help from another person eating meals?: None Help from another person taking care of personal grooming?: None Help from another person toileting, which includes using toliet, bedpan, or urinal?: None Help from another person bathing (including washing, rinsing, drying)?: None Help from another person to put on and taking off regular upper body clothing?: None Help from  another person to put on and taking off regular lower body clothing?: None 6 Click Score: 24   End of Session Equipment Utilized During Treatment: Gait belt Nurse Communication: Mobility status  Activity Tolerance: Patient tolerated treatment well Patient left: in chair;with call bell/phone within reach;with chair alarm set  OT Visit Diagnosis: Unsteadiness on feet (R26.81);Other symptoms and signs involving the nervous system (R29.898)                Time: 7893-8101 OT Time Calculation (min): 23 min Charges:  OT General Charges $OT Visit: 1 Procedure OT Evaluation $OT Eval Moderate Complexity: 1 Procedure OT Treatments $Self Care/Home Management : 8-22 mins G-Codes:     Hulda Humphrey OTR/L [place] 10/06/2016, 10:47 AM

## 2016-10-06 NOTE — Progress Notes (Signed)
STROKE TEAM PROGRESS NOTE   HISTORY OF PRESENT ILLNESS (per record) Breanna Obrien is an 61 y.o. female with PMH significant for HTN, DM prior L Bell's palsy with residual L ptosis and facial drrp who presnted to the hospital complaning of R sided numbness more in the leg but including the etire R side, symptoms started this morning 10/05/2016 around 9AM. Pt denied any weakness, no slurred speech, no visual changes. Neurology consult was called to evaluate the pt for possible stroke. In ED Breanna Obrien was normal. Pt denied headaches, N/V or any pain. Modified Rankin: 0. Patient was not administered IV t-PA secondary to being outside the window, NIHSS 1. She was admitted for further evaluation and treatment.   SUBJECTIVE (INTERVAL HISTORY) No family at bedside. She just moved to [place] from [place] 2 months ago, her insurance not working here and she was out of medication. Her sugar not in good control at home and she was very stressed for the last 2 months.    OBJECTIVE Temp:  [97.5 F (36.4 C)-98.4 F (36.9 C)] 98.2 F (36.8 C) (05/11 0530) Pulse Rate:  [70-95] 82 (05/11 0745) Cardiac Rhythm: Normal sinus rhythm (05/11 0700) Resp:  [14-24] 18 (05/11 0530) BP: (111-203)/(43-89) 134/58 (05/11 0745) SpO2:  [94 %-99 %] 99 % (05/11 0745) Weight:  [84 kg (185 lb 1.6 oz)-84.4 kg (186 lb)] 84 kg (185 lb 1.6 oz) (05/11 0130)  CBC:  Recent Labs Lab 10/05/16 1203 10/05/16 1337 10/06/16 0333  WBC 5.8  --  7.1  NEUTROABS 4.1  --   --   HGB 13.9 13.9 12.1  HCT 41.0 41.0 37.2  MCV 93.4  --  93.5  PLT 261  --  545    Basic Metabolic Panel:  Recent Labs Lab 10/05/16 1203 10/05/16 1337 10/06/16 0333  NA 135 136 136  K 3.8 3.9 3.3*  CL 96* 97* 100*  CO2 26  --  26  GLUCOSE 357* 367* 239*  BUN 12 14 10   CREATININE 1.10* 1.10* 0.93  CALCIUM 9.7  --  9.3    Lipid Panel:    Component Value Date/Time   CHOL 187 10/06/2016 0333   TRIG 155 (H) 10/06/2016 0333   HDL 37 (L) 10/06/2016 0333   CHOLHDL 5.1 10/06/2016 0333   VLDL 31 10/06/2016 0333   LDLCALC 119 (H) 10/06/2016 0333   HgbA1c: No results found for: HGBA1C Urine Drug Screen: No results found for: LABOPIA, COCAINSCRNUR, LABBENZ, AMPHETMU, THCU, LABBARB  Alcohol Level No results found for: [place] I have personally reviewed the radiological images below and agree with the radiology interpretations.  Ct Head Wo Contrast 10/05/2016 1. 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 2. Otherwise negative CT of the head.   Ct Angio Head W Or Wo Contrast Ct Angio Neck W Or Wo Contrast 10/05/2016 1. No emergent large vessel occlusion. 2. Approximately 60% stenosis of the proximal right internal carotid artery secondary to atherosclerotic calcification. 3. Very diminutive left posterior cerebral artery arising from a small posterior communicating artery. 4. 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 A diminutive vessel appears to arise from the apex, but connection to the basilar artery is not visualized.   Mr Brain 20 Contrast Mr Breanna Obrien Head/brain GY Cm Mr Breanna Obrien Neck W Wo Contrast 10/06/2016 1. Subcentimeter focus of acute/early subacute infarction  within the left posterior pons. No acute intracranial hemorrhage. 2. Mild chronic microvascular ischemic changes and mild parenchymal volume loss of the brain. 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 3. Poor flow related signal within the left posterior cerebral artery with diminutive vessel/severe stenosis on same-day CT angiogram of head. 4. Left cavernous ICA outpouching on CT angiogram is not appreciable on MR angiogram. 5. Otherwise patent circle of Willis without evidence for high-grade stenosis, large vessel occlusion, or aneurysm. 6. Right proximal internal carotid artery moderate  50-60% stenosis. Left proximal internal carotid artery mild 30-40% stenosis. Patent codominant vertebral arteries.   TTE pending   PHYSICAL EXAM  Temp:  [97.5 F (36.4 C)-98.4 F (36.9 C)] 98.2 F (36.8 C) (05/11 0530) Pulse Rate:  [70-95] 82 (05/11 0745) Resp:  [14-24] 18 (05/11 0530) BP: (111-203)/(43-89) 134/58 (05/11 0745) SpO2:  [94 %-99 %] 99 % (05/11 0745) Weight:  [185 lb 1.6 oz (84 kg)] 185 lb 1.6 oz (84 kg) (05/11 0130)  General - Well nourished, well developed, in no apparent distress.  Ophthalmologic - Sharp disc margins OU.   Cardiovascular - Regular rate and rhythm.  Mental Status -  Level of arousal and orientation to time, place, and person were intact. Language including expression, naming, repetition, comprehension was assessed and found intact. Attention span and concentration were normal. Fund of Knowledge was assessed and was intact.  Cranial Nerves II - XII - II - Visual field intact OU. III, IV, VI - Extraocular movements intact. V - Facial sensation intact bilaterally. VII - Facial movement intact bilaterally. VIII - Hearing & vestibular intact bilaterally. X - Palate elevates symmetrically. XI - Chin turning & shoulder shrug intact bilaterally. XII - Tongue protrusion intact.  Motor Strength - The patient's strength was normal in all extremities and pronator drift was absent.  Bulk was normal and fasciculations were absent.   Motor Tone - Muscle tone was assessed at the neck and appendages and was normal.  Reflexes - The patient's reflexes were 1+ in all extremities and she had no pathological reflexes.  Sensory - Light touch, temperature/pinprick were assessed and were decreased on the RLE, about 75% of LLE, also decreased on the RUE, about 90% of the LUE.    Coordination - The patient had normal movements in the hands with no ataxia or dysmetria.  Tremor was absent.  Gait and Station - deferred.   ASSESSMENT/PLAN Breanna Obrien is a  61 y.o. female with history of HTN, DM prior L Bell's palsy with residual L ptosis and facial droop presenting with R sided numbness. She did not receive IV t-PA due to being outside the window, NIHSS .   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  Resultant  R hemiparesthesia   CT head. Old right frontal white matter infarct.   CT angiogram of head and neck no LVO. Right ICA 60% and left ICA 50% stenosis. b/l ICA siphon atherosclerosis. Left PCA stenosis  MRI brain, MRA head, MRA neck left pontine infarct. Small vessel disease. Atrophy. Old right frontal periventricular white matter infarct. Right ICA 50-60% stenosis. Left ICA 30-40% stenosis. Left PCA high grade stenosis. no LVO.   2D Echo  pending  LDL 119  HgbA1c pending  Lovenox 40 mg sq daily for VTE prophylaxis  Diet heart healthy/carb modified Room service appropriate? Yes; Fluid consistency: Thin  No antithrombotic prior to admission, now on aspirin 325 mg daily and plavix 75mg  daily. Continue DAPT  for 3 months and then plavix alone.  Patient counseled to be compliant with her antithrombotic medications  Ongoing aggressive stroke risk factor management  Therapy recommendations:  Outpatient PT, no OT  Disposition:  Return home  Carotid stenosis  b/l carotid stenosis  CTA and MRA suggest 50-60% stenosis bilaterally  Asymptomatic this time  Recommend outpt vascular surgery referral.   Hypertension  elevated on arrival, improving  Permissive hypertension (OK if < 220/120) but gradually normalize in 5-7 days  Long-term BP goal normotensive  Hyperlipidemia  Home meds:  No statin  LDL 119, goal < 70  Now on Lipitor 40 mg daily  Continue statin at discharge  Diabetes type II  HgbA1c pending, goal < 7.0  Uncontrolled  Hyperglycemia  SSI  On levemir  CBG monitoring  PCP follow up next Monday.   Other Stroke Risk Factors  Advanced age  Obesity, Body mass index is  32.79 kg/m., recommend weight loss, diet and exercise as appropriate   Hospital day # 1  Neurology will sign off. Please call with questions. Pt will follow up with carolyn Hassell Done NP at [facility] in about 6 weeks. Thanks for the consult.  Rosalin Hawking, MD PhD Stroke Neurology 10/06/2016 1:23 PM  To contact Stroke Continuity provider, please refer to [email or web address] After hours, contact General Neurology

## 2016-10-06 NOTE — Evaluation (Signed)
Physical Therapy Evaluation Patient Details Name: Breanna Obrien MRN: [id number] DOB: 06/30/1955 Today's Date: 10/06/2016   History of Present Illness  Breanna Obrien is an 61 y.o. female with PMH significant for HTN, DM prior L Bell's palsy with residual L ptosis and facial droop who presnted to the hospital complaning of R sided numbness more in the leg but including the etire R side. MRI revealed acute/early subacute infarction within the left posterior pons. 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  Clinical Impression  Pt presented supine in bed with HOB elevated, awake and willing to participate in therapy session. Prior to admission, pt reported that she was independent with all functional mobility and ADLs. 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 Pt ambulated in hallway with min guard and no AD. Pt also participated in stair training this session. Pt would continue to benefit from skilled physical therapy services at this time while admitted and after d/c to address the below listed limitations in order to improve overall safety and independence with functional mobility.     Follow Up Recommendations Outpatient PT (likely to refuse due to out of state insurance)    Engineer, technical sales;Other (comment) (pt reported she may get one on her own later)    Recommendations for Other Services       Precautions / Restrictions Precautions Precautions: Fall Restrictions Weight Bearing Restrictions: No      Mobility  Bed Mobility Overal bed mobility: Modified Independent                Transfers Overall transfer level: Needs assistance Equipment used: None Transfers: Sit to/from Stand Sit to Stand: Supervision         General transfer comment: increased time, supervision for safety  Ambulation/Gait Ambulation/Gait assistance: Min guard Ambulation Distance (Feet): 300 Feet Assistive device: None Gait  Pattern/deviations: Step-through pattern;Decreased step length - right;Decreased step length - left;Decreased stride length;Wide base of support Gait velocity: decreased Gait velocity interpretation: Below normal speed for age/gender General Gait Details: pt with modest instability but no LOB or need for physical assistance, close min guard for safety  Stairs Stairs: Yes Stairs assistance: Min guard Stair Management: Two rails;Alternating pattern;Forwards Number of Stairs: 2    Wheelchair Mobility    Modified Rankin (Stroke Patients Only) Modified Rankin (Stroke Patients Only) Pre-Morbid Rankin Score: No symptoms Modified Rankin: Slight disability     Balance Overall balance assessment: Needs assistance Sitting-balance support: Feet supported Sitting balance-Leahy Scale: Good     Standing balance support: During functional activity;No upper extremity supported Standing balance-Leahy Scale: Fair                               Pertinent Vitals/Pain Pain Assessment: No/denies pain    Home Living Family/patient expects to be discharged to:: Private residence Living Arrangements: Spouse/significant other Available Help at Discharge: Family;Available PRN/intermittently Type of Home: House Home Access: Stairs to enter Entrance Stairs-Rails: None Entrance Stairs-Number of Steps: 2 Home Layout: Two level;Able to live on main level with bedroom/bathroom Home Equipment: None      Prior Function Level of Independence: Independent         Comments: pt still working full-time in Estate manager/land agent [employer]   Dominant Hand: Right    Extremity/Trunk Assessment   Upper Extremity Assessment Upper Extremity Assessment: Defer to OT evaluation    Lower Extremity Assessment Lower Extremity Assessment:  Overall [facility] for tasks assessed    Cervical / Trunk Assessment Cervical / Trunk Assessment: Normal  Communication   Communication: No difficulties   Cognition Arousal/Alertness: Awake/alert Behavior During Therapy: WFL for tasks assessed/performed Overall Cognitive Status: Within Functional Limits for tasks assessed                                        General Comments      Exercises     Assessment/Plan    PT Assessment Patient needs continued PT services  PT Problem List Decreased balance;Decreased mobility;Decreased coordination;Decreased safety awareness;Decreased knowledge of use of DME;Impaired sensation       PT Treatment Interventions DME instruction;Gait training;Stair training;Functional mobility training;Therapeutic activities;Therapeutic exercise;Balance training;Neuromuscular re-education;Patient/family education    PT Goals (Current goals can be found in the Care Plan section)  Acute Rehab PT Goals Patient Stated Goal: return home and back to independence PT Goal Formulation: With patient Time For Goal Achievement: 10/20/16 Potential to Achieve Goals: Good    Frequency Min 4X/week   Barriers to discharge        Co-evaluation               AM-PAC PT "6 Clicks" Daily Activity  Outcome Measure Difficulty turning over in bed (including adjusting bedclothes, sheets and blankets)?: None Difficulty moving from lying on back to sitting on the side of the bed? : None Difficulty sitting down on and standing up from a chair with arms (e.g., wheelchair, bedside commode, etc,.)?: A Little Help needed moving to and from a bed to chair (including a wheelchair)?: A Little Help needed walking in hospital room?: A Little Help needed climbing 3-5 steps with a railing? : A Little 6 Click Score: 20    End of Session Equipment Utilized During Treatment: Gait belt Activity Tolerance: Patient tolerated treatment well Patient left: in chair;with call bell/phone within reach;with chair alarm set Nurse Communication: Mobility status PT Visit Diagnosis: Other abnormalities of gait and mobility  (R26.89);Other symptoms and signs involving the nervous system (R29.898)    Time: 3612-2449 PT Time Calculation (min) (ACUTE ONLY): 24 min   Charges:   PT Evaluation $PT Eval Moderate Complexity: 1 Procedure PT Treatments $Gait Training: 8-22 mins   PT G Codes:        Kramer, PT, DPT [place] 10/06/2016, 9:53 AM

## 2016-10-06 NOTE — Progress Notes (Signed)
Inpatient Diabetes Program Recommendations  AACE/ADA: New Consensus Statement on Inpatient Glycemic Control (2015)  Target Ranges:  Prepandial:   less than 140 mg/dL      Peak postprandial:   less than 180 mg/dL (1-2 hours)      Critically ill patients:  140 - 180 mg/dL   Lab Results  Component Value Date   GLUCAP 265 (H) 10/06/2016    Review of Glycemic Control:  Results for NELY, DEDMON (MRN [id number]) as of 10/06/2016 09:21  Ref. Range 10/06/2016 04:01 10/06/2016 09:14  Glucose-Capillary Latest Ref Range: 65 - 99 mg/dL 238 (H) 265 (H)   Diabetes history: Type 2 diabetes Outpatient Diabetes medications: Farxiga 10 mg daily (out of med per med. rec), Glipizide 10 mg bid, Metformin 1000 mg bid Current orders for Inpatient glycemic control:  Novolog moderate q 4 hours  Inpatient Diabetes Program Recommendations:   Please consider adding Lantus 16 units daily while patient is in the hospital.  A1C pending.  Once diet started, she likely will need meal coverage as well.  Will order Living well with diabetes booklet for patient as well.   Thanks, Adah Perl, RN, BC-ADM Inpatient Diabetes Coordinator Pager [phone] (8a-5p)

## 2016-10-06 NOTE — Care Management Note (Signed)
Case Management Note  Patient Details  Name: Breanna Obrien MRN: [id number] Date of Birth: Jan 07, 1956  Subjective/Objective:   Pt admitted with CVA. She is from home with her spouse. Pt is new to [place] and does not have insurance that is recognized by [facility] per patient. Pt has also not established with a PCP in the area.                  Action/Plan: CM met with the patient and inquired about [facility] until she is able to establish insurance in [place]. She was very interested. CM was able to obtain her an appointment for Monday. Information on the AVS and given to the patient.   PT recommending outpatient therapy. Pt states she can not afford this at this time. CM will follow for further d/c needs.   Expected Discharge Date:  10/09/16               Expected Discharge Plan:     In-House Referral:     Discharge planning Services     Post Acute Care Choice:    Choice offered to:     DME Arranged:    DME Agency:     HH Arranged:    HH Agency:     Status of Service:  In process, will continue to follow  If discussed at Long Length of Stay Meetings, dates discussed:    Additional Comments:  Pollie Friar, RN 10/06/2016, 10:45 AM

## 2016-10-06 NOTE — Progress Notes (Signed)
Inpatient Diabetes Program Recommendations  AACE/ADA: New Consensus Statement on Inpatient Glycemic Control (2015)  Target Ranges:  Prepandial:   less than 140 mg/dL      Peak postprandial:   less than 180 mg/dL (1-2 hours)      Critically ill patients:  140 - 180 mg/dL   Lab Results  Component Value Date   GLUCAP 265 (H) 10/06/2016    Review of Glycemic Control  Inpatient Diabetes Program Recommendations:  Noted A1c remains pending. Spoke with patient and husband @ bedside. Patient reports her A1c in [place] was 7.7 prior to D/C of current DM medications. Patient shared plans to definitely keep appt. @ [facility] on Monday and states understanding of importance of medication compliance. Has meter and strips @ home to check CBGs. Spoke with pt about basic diabetes diet nutrition principles, importance of checking CBGs and maintaining good CBG control to prevent long-term and short-term complications. Reviewed signs and symptoms of hyperglycemia and hypoglycemia and how to treat hypoglycemia at home. Also reviewed blood sugar goals at home.  Will follow.  Thank you, Nani Gasser. Nonna Renninger, RN, MSN, CDE  Diabetes Coordinator Inpatient Glycemic Control Team Team Pager [phone] (8am-5pm) 10/06/2016 12:36 PM

## 2016-10-07 ENCOUNTER — Inpatient Hospital Stay (HOSPITAL_COMMUNITY): Payer: Self-pay

## 2016-10-07 DIAGNOSIS — I6350 Cerebral infarction due to unspecified occlusion or stenosis of unspecified cerebral artery: Secondary | ICD-10-CM

## 2016-10-07 DIAGNOSIS — I6523 Occlusion and stenosis of bilateral carotid arteries: Secondary | ICD-10-CM

## 2016-10-07 LAB — CBC
HEMATOCRIT: 38.2 % (ref 36.0–46.0)
HEMOGLOBIN: 12.9 g/dL (ref 12.0–15.0)
MCH: 31.3 pg (ref 26.0–34.0)
MCHC: 33.8 g/dL (ref 30.0–36.0)
MCV: 92.7 fL (ref 78.0–100.0)
Platelets: 276 10*3/uL (ref 150–400)
RBC: 4.12 MIL/uL (ref 3.87–5.11)
RDW: 12.7 % (ref 11.5–15.5)
WBC: 6.4 10*3/uL (ref 4.0–10.5)

## 2016-10-07 LAB — ECHOCARDIOGRAM COMPLETE
AVLVOTPG: 3 mmHg
CHL CUP DOP CALC LVOT VTI: 24.8 cm
CHL CUP MV DEC (S): 194
CHL CUP STROKE VOLUME: 42 mL
CHL CUP TV REG PEAK VELOCITY: 216 cm/s
E decel time: 194 msec
EERAT: 7.35
FS: 39 % (ref 28–44)
Height: 63 in
IV/PV OW: 0.9
LA diam index: 1.78 cm/m2
LASIZE: 35 mm
LAVOL: 51.5 mL
LAVOLA4C: 56.2 mL
LAVOLIN: 26.2 mL/m2
LEFT ATRIUM END SYS DIAM: 35 mm
LV E/e' medial: 7.35
LV E/e'average: 7.35
LV PW d: 10 mm — AB (ref 0.6–1.1)
LV TDI E'MEDIAL: 6.96
LV dias vol: 70 mL (ref 46–106)
LV e' LATERAL: 11 cm/s
LV sys vol index: 14 mL/m2
LVDIAVOLIN: 35 mL/m2
LVOT area: 2.54 cm2
LVOT peak vel: 92.4 cm/s
LVOTD: 18 mm
LVOTSV: 63 mL
LVSYSVOL: 27 mL (ref 14–42)
MV pk A vel: 74.4 m/s
MVAP: 3.93 cm2
MVPG: 3 mmHg
MVPKEVEL: 80.9 m/s
P 1/2 time: 56 ms
RV LATERAL S' VELOCITY: 10.4 cm/s
RV sys press: 22 mmHg
Simpson's disk: 61
TAPSE: 23.2 mm
TDI e' lateral: 11
TRMAXVEL: 216 cm/s
Weight: 2961.6 oz

## 2016-10-07 LAB — COMPREHENSIVE METABOLIC PANEL
ALBUMIN: 3.8 g/dL (ref 3.5–5.0)
ALK PHOS: 71 U/L (ref 38–126)
ALT: 67 U/L — ABNORMAL HIGH (ref 14–54)
ANION GAP: 10 (ref 5–15)
AST: 42 U/L — ABNORMAL HIGH (ref 15–41)
BUN: 8 mg/dL (ref 6–20)
CHLORIDE: 101 mmol/L (ref 101–111)
CO2: 26 mmol/L (ref 22–32)
Calcium: 9.5 mg/dL (ref 8.9–10.3)
Creatinine, Ser: 0.8 mg/dL (ref 0.44–1.00)
GFR calc Af Amer: >60 mL/min (ref >60)
GFR calc non Af Amer: >60 mL/min (ref >60)
Glucose, Bld: 119 mg/dL — ABNORMAL HIGH (ref 65–99)
Potassium: 3.7 mmol/L (ref 3.5–5.1)
SODIUM: 137 mmol/L (ref 135–145)
TOTAL PROTEIN: 7.4 g/dL (ref 6.5–8.1)
Total Bilirubin: 0.7 mg/dL (ref 0.3–1.2)

## 2016-10-07 LAB — GLUCOSE, CAPILLARY
GLUCOSE-CAPILLARY: 143 mg/dL — AB (ref 65–99)
GLUCOSE-CAPILLARY: 138 mg/dL — AB (ref 65–99)
GLUCOSE-CAPILLARY: 287 mg/dL — AB (ref 65–99)
GLUCOSE-CAPILLARY: 187 mg/dL — AB (ref 65–99)
Glucose-Capillary: 152 mg/dL — ABNORMAL HIGH (ref 65–99)

## 2016-10-07 LAB — HEMOGLOBIN A1C
Hgb A1c MFr Bld: 7.3 % — ABNORMAL HIGH (ref 4.8–5.6)
MEAN PLASMA GLUCOSE: 163 mg/dL

## 2016-10-07 MED ORDER — GABAPENTIN 600 MG PO TABS
300.0000 mg | ORAL_TABLET | Freq: Every day | ORAL | Status: DC
  Administered 2016-10-07: 300 mg via ORAL
  Filled 2016-10-07: qty 1

## 2016-10-07 MED ORDER — CLOPIDOGREL BISULFATE 75 MG PO TABS: 75.0000 mg | ORAL_TABLET | Freq: Every day | ORAL | 3 refills | Status: DC

## 2016-10-07 MED ORDER — ATORVASTATIN CALCIUM 40 MG PO TABS: 40.0000 mg | ORAL_TABLET | Freq: Every day | ORAL | 2 refills | Status: DC

## 2016-10-07 MED ORDER — LORATADINE 10 MG PO TABS
10.0000 mg | ORAL_TABLET | Freq: Every day | ORAL | Status: DC
  Administered 2016-10-07 (×2): 10 mg via ORAL
  Filled 2016-10-07 (×2): qty 1

## 2016-10-07 MED ORDER — VALSARTAN-HYDROCHLOROTHIAZIDE 320-25 MG PO TABS: 1.0000 | ORAL_TABLET | Freq: Every day | ORAL | 0 refills | Status: DC

## 2016-10-07 MED ORDER — ASPIRIN 325 MG PO TABS: 325.0000 mg | ORAL_TABLET | Freq: Every day | ORAL | 2 refills | Status: DC

## 2016-10-07 NOTE — Progress Notes (Signed)
Patient left unit by wheelchair accompanied by staff.

## 2016-10-07 NOTE — Discharge Instructions (Signed)
Blood Glucose Monitoring, Adult Monitoring your blood sugar (glucose) helps you manage your diabetes. It also helps you and your health care provider determine how well your diabetes management plan is working. Blood glucose monitoring involves checking your blood glucose as often as directed, and keeping a record (log) of your results over time. Why should I monitor my blood glucose? Checking your blood glucose regularly can:  Help you understand how food, exercise, illnesses, and medicines affect your blood glucose.  Let you know what your blood glucose is at any time. You can quickly tell if you are having low blood glucose (hypoglycemia) or high blood glucose (hyperglycemia).  Help you and your health care provider adjust your medicines as needed. When should I check my blood glucose? Follow instructions from your health care provider about how often to check your blood glucose. This may depend on:  The type of diabetes you have.  How well-controlled your diabetes is.  Medicines you are taking. If you have type 1 diabetes:   Check your blood glucose at least 2 times a day.  Also check your blood glucose:  Before every insulin injection.  Before and after exercise.  Between meals.  2 hours after a meal.  Occasionally between 2:00 a.m. and 3:00 a.m., as directed.  Before potentially dangerous tasks, like driving or using heavy machinery.  At bedtime.  You may need to check your blood glucose more often, up to 6-10 times a day:  If you use an insulin pump.  If you need multiple daily injections (MDI).  If your diabetes is not well-controlled.  If you are ill.  If you have a history of severe hypoglycemia.  If you have a history of not knowing when your blood glucose is getting low (hypoglycemia unawareness). If you have type 2 diabetes:   If you take insulin or other diabetes medicines, check your blood glucose at least 2 times a day.  If you are on intensive  insulin therapy, check your blood glucose at least 4 times a day. Occasionally, you may also need to check between 2:00 a.m. and 3:00 a.m., as directed.  Also check your blood glucose:  Before and after exercise.  Before potentially dangerous tasks, like driving or using heavy machinery.  You may need to check your blood glucose more often if:  Your medicine is being adjusted.  Your diabetes is not well-controlled.  You are ill. What is a blood glucose log?  A blood glucose log is a record of your blood glucose readings. It helps you and your health care provider:  Look for patterns in your blood glucose over time.  Adjust your diabetes management plan as needed.  Every time you check your blood glucose, write down your result and notes about things that may be affecting your blood glucose, such as your diet and exercise for the day.  Most glucose meters store a record of glucose readings in the meter. Some meters allow you to download your records to a computer. How do I check my blood glucose? Follow these steps to get accurate readings of your blood glucose: Supplies needed    Blood glucose meter.  Test strips for your meter. Each meter has its own strips. You must use the strips that come with your meter.  A needle to prick your finger (lancet). Do not use lancets more than once.  A device that holds the lancet (lancing device).  A journal or log book to write down your results. Procedure  Wash your hands with soap and water.  Prick the side of your finger (not the tip) with the lancet. Use a different finger each time.  Gently rub the finger until a small drop of blood appears.  Follow instructions that come with your meter for inserting the test strip, applying blood to the strip, and using your blood glucose meter.  Write down your result and any notes. Alternative testing sites   Some meters allow you to use areas of your body other than your finger  (alternative sites) to test your blood.  If you think you may have hypoglycemia, or if you have hypoglycemia unawareness, do not use alternative sites. Use your finger instead.  Alternative sites may not be as accurate as the fingers, because blood flow is slower in these areas. This means that the result you get may be delayed, and it may be different from the result that you would get from your finger.  The most common alternative sites are:  Forearm.  Thigh.  Palm of the hand. Additional tips   Always keep your supplies with you.  If you have questions or need help, all blood glucose meters have a 24-hour hotline number that you can call. You may also contact your health care provider.  After you use a few boxes of test strips, adjust (calibrate) your blood glucose meter by following instructions that came with your meter. This information is not intended to replace advice given to you by your health care provider. Make sure you discuss any questions you have with your health care provider. Document Released: 05/18/2003 Document Revised: 12/03/2015 Document Reviewed: 10/25/2015 Elsevier Interactive Patient Education  2017 [employer] Cerebral Arteriosclerosis Cerebral arteriosclerosis is a condition that affects the arteries in your brain. When you have cerebral arteriosclerosis, these arteries become thicker, harder, and narrower than normal. Once this damage starts, a sticky substance (plaque) can build up inside your arteries and block blood flow. This reduces the normal blood flow to your brain. Blood carries oxygen to your brain. Since blood flow to your brain is reduced, your brain may not get the oxygen it needs. This process may start early in life and build up over time. Blood clots or plaques that rupture and break off may completely block blood flow and cause sudden and serious damage. Cerebral arteriosclerosis can cause serious problems such as stroke or dementia. What  increases the risk? Risk factors for developing cerebral arteriosclerosis include:  Being female.  Older age. The likelihood of this disorder increases with age.  Being Asian, African American, or Hispanic.  Having a family history of certain conditions, such as heart disease or stroke.  Having heart disease or a history of stroke. Lifestyle factors that can increase your risk of developing cerebral arteriosclerosis include:  Smoking or being exposed to secondhand smoke.  Having diabetes.  Being overweight.  Not getting enough exercise.  Having high blood pressure (hypertension).  Having high cholesterol.  Having a diet high in fat. What are the signs or symptoms? The most common symptoms of cerebral arteriosclerosis include:  Numbness or weakness in your arms or legs.  Speech problems.  Problems swallowing.  Vision problems.  Confusion.  Irritability.  Personality changes, such as loss of interest, irritability, or confusion (vascular dementia).  Headache or facial pain. How is this diagnosed? Your health care provider may diagnose cerebral arteriosclerosis based on your symptoms and a physical exam. You may also have imaging studiesof your brain to confirm the diagnosis. These  may include:  An imaging test using sound waves (ultrasound).  An imaging test after getting an injection of dye (angiogram). How is this treated? Treatment may include:  Medicine to control conditions that put you at risk of developing cerebral arteriosclerosis. These conditions may include:  High blood pressure.  High cholesterol.  Diabetes.  Taking medicine to thin your blood and prevent stroke.  Surgery. This may be:  Intracranial stent placement. This is a procedure to locate a blocked artery in the brain, widen the artery, and keep the artery open.  Surgery to bypass a blocked artery with a healthy artery taken from another part of the body. Follow these instructions  at home:  Take medicine only as directed by your health care provider.  Do not use any tobacco products, including cigarettes, chewing tobacco, or electronic cigarettes. If you need help quitting, ask your health care provider.  Eat a low-fat, low-cholesterol, and low-sodium diet as directed by your health care provider.  Follow an exercise program approved by your health care provider.  Maintain a healthy weight. Lose weight if necessary.  Work with your health care provider to manage other health conditions, such as hypertension or diabetes.  Keep all follow-up visits as directed by your health care provider. This is important. Contact a health care provider if:  You have frequent headaches.  You have dizziness.  You or someone else notices changes in your personality.  You have facial pain.  You have periods of confusion. Get help right away if:  You have a very bad headache.  You have weakness or numbness in your arms or legs.  You have droopiness of your face.  You have difficulty speaking.  You have difficulty swallowing.  You have trouble understanding what other people are saying.  You have sudden confusion.  You have sudden vision problems.  You have a seizure or loss of consciousness. Any of these symptoms may represent a serious problem that is an emergency. Do not wait to see if the symptoms will go away. Get medical help right away. Call your local emergency services (911 in [place] Do not drive yourself to the hospital. This information is not intended to replace advice given to you by your health care provider. Make sure you discuss any questions you have with your health care provider. Document Released: 05/05/2002 Document Revised: 10/21/2015 Document Reviewed: 07/03/2013 Elsevier Interactive Patient Education  2017 [employer].

## 2016-10-07 NOTE — Progress Notes (Signed)
300 mg PO gabapentin Q hs and Claritin PO for allergy relief ordered per Dr Olevia Bowens. RN will provide medication education and administration to patient and continue to monitor.

## 2016-10-07 NOTE — Progress Notes (Signed)
Patient c/o constant nagging pins/needles pain 8/10 in [place] worse in left foot. Patient states it has improved since admission but would like something to ease the sensation. Patient also requesting something for allergy relief. Patient states she takes Claritin at home for itchy, running nose and eyes. [place] oncall paged. RN will continue to monitor patient.

## 2016-10-07 NOTE — Discharge Summary (Addendum)
Physician Discharge Summary  Zakaria Fromer MRN: [id number] DOB/AGE: 1955/10/22 61 y.o.  PCP: Patient, No Pcp Per   Admit date: 10/05/2016 Discharge date: 10/07/2016  Discharge Diagnoses:    Principal Problem:   CVA (cerebral vascular accident) [facility]) Active Problems:   Type 2 diabetes mellitus with complication, without long-term current use of insulin (HCC)   Essential hypertension   Ataxia   Hyperlipidemia   Bilateral carotid artery stenosis    Follow-up recommendations Follow-up with PCP in 3-5 days , including all  additional recommended appointments as below Follow-up CBC, CMP in 3-5 days Ambulatory vascular surgery evaluation recommended Follow-up with neurology as scheduled      Current Discharge Medication List    START taking these medications   Details  aspirin 325 MG tablet Take 1 tablet (325 mg total) by mouth daily. Qty: 30 tablet, Refills: 2    atorvastatin (LIPITOR) 40 MG tablet Take 1 tablet (40 mg total) by mouth daily at 6 PM. Qty: 30 tablet, Refills: 2    clopidogrel (PLAVIX) 75 MG tablet Take 1 tablet (75 mg total) by mouth daily. Qty: 30 tablet, Refills: 3      CONTINUE these medications which have CHANGED   Details  valsartan-hydrochlorothiazide (DIOVAN-HCT) 320-25 MG tablet Take 1 tablet by mouth daily. Qty: 30 tablet, Refills: 0      CONTINUE these medications which have NOT CHANGED   Details  amLODipine (NORVASC) 10 MG tablet Take 10 mg by mouth daily.    atenolol (TENORMIN) 100 MG tablet Take 100 mg by mouth every evening.    dapagliflozin propanediol (FARXIGA) 10 MG TABS tablet Take 10 mg by mouth daily.    glipiZIDE (GLUCOTROL) 10 MG tablet Take 10 mg by mouth 2 (two) times daily before a meal.    metFORMIN (GLUCOPHAGE) 1000 MG tablet Take 1,000 mg by mouth 2 (two) times daily with a meal.         Discharge Condition: Stable   Discharge Instructions Get Medicines reviewed and adjusted: Please take all your medications  with you for your next visit with your Primary MD  Please request your Primary MD to go over all hospital tests and procedure/radiological results at the follow up, please ask your Primary MD to get all Hospital records sent to his/her office.  If you experience worsening of your admission symptoms, develop shortness of breath, life threatening emergency, suicidal or homicidal thoughts you must seek medical attention immediately by calling 911 or calling your MD immediately if symptoms less severe.  You must read complete instructions/literature along with all the possible adverse reactions/side effects for all the Medicines you take and that have been prescribed to you. Take any new Medicines after you have completely understood and accpet all the possible adverse reactions/side effects.   Do not drive when taking Pain medications.   Do not take more than prescribed Pain, Sleep and Anxiety Medications  Special Instructions: If you have smoked or chewed Tobacco in the last 2 yrs please stop smoking, stop any regular Alcohol and or any Recreational drug use.  Wear Seat belts while driving.  Please note  You were cared for by a hospitalist during your hospital stay. Once you are discharged, your primary care physician will handle any further medical issues. Please note that NO REFILLS for any discharge medications will be authorized once you are discharged, as it is imperative that you return to your primary care physician (or establish a relationship with a primary care physician if you do  not have one) for your aftercare needs so that they can reassess your need for medications and monitor your lab values.  Discharge Instructions    Ambulatory referral to Neurology    Complete by:  As directed    Follow up with stroke clinic Cecille Rubin preferred, if not available, then consider [facility]nut, [facility] or Jaynee Eagles whoever is available) at [facility] in about 6-8 weeks. Thanks.   Ambulatory referral  to Vascular Surgery    Complete by:  As directed    b/l carotid stenosis       No Known Allergies    Disposition: Home with outpatient OT   Consults:  Neurology     Significant Diagnostic Studies:  Ct Angio Head W Or Wo Contrast  Result Date: 10/05/2016 CLINICAL DATA:  Right-sided leg numbness EXAM: CT ANGIOGRAPHY HEAD AND NECK TECHNIQUE: Multidetector CT imaging of the head and neck was performed using the standard protocol during bolus administration of intravenous contrast. Multiplanar CT image reconstructions and MIPs were obtained to evaluate the vascular anatomy. Carotid stenosis measurements (when applicable) are obtained utilizing NASCET criteria, using the distal internal carotid diameter as the denominator. CONTRAST:  50 mL Isovue 370 COMPARISON:  Head CT 10/05/2016 FINDINGS: CTA NECK FINDINGS Aortic arch: There is no aneurysm or dissection of the visualized ascending aorta or aortic arch. There is a normal 3 vessel branching pattern. The visualized proximal subclavian arteries are normal. There is aortic arch atherosclerosis. Right carotid system: The right common carotid origin is widely patent. There is no common carotid or internal carotid artery dissection or aneurysm. Atherosclerotic calcification of the right carotid bifurcation results in approximately 60% proximal right ICA stenosis. Left carotid system: The origin of the left common carotid artery in the remainder of the common carotid course are normal. At the carotid bifurcation, there is atherosclerotic calcification the results in less than 50% stenosis. No dissection or aneurysm. Vertebral arteries: The vertebral system is codominant. Both vertebral artery origins are normal. Both vertebral arteries are normal to their confluence with the basilar artery. Skeleton: There is no bony spinal canal stenosis. No lytic or blastic lesions. Other neck: The nasopharynx is clear. The oropharynx and hypopharynx are normal. The  epiglottis is normal. The supraglottic larynx, glottis and subglottic larynx are normal. No retropharyngeal collection. The parapharyngeal spaces are preserved. The parotid and submandibular glands are normal. No sialolithiasis or salivary ductal dilatation. The thyroid gland is normal. There is no cervical lymphadenopathy. Upper chest: No pneumothorax or pleural effusion. No nodules or masses. Review of the MIP images confirms the above findings CTA HEAD FINDINGS Anterior circulation: --Intracranial internal carotid arteries: There is atherosclerotic calcification of both lacerum and cavernous segments without advanced stenosis. 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 The distal aspect of the vessel is diminutive and not clearly visualized. --Anterior cerebral arteries: Normal. --Middle cerebral arteries: Normal. --Posterior communicating arteries: There is a small P-comm present on the left. Posterior circulation: --Posterior cerebral arteries: The left posterior cerebral artery is severely diminutive. The right is normal. --Superior cerebellar arteries: Normal on the right. Diminutive on the left. --Basilar artery: Normal. --Anterior inferior cerebellar arteries: Normal. --Posterior inferior cerebellar arteries: Normal. Venous sinuses: As permitted by contrast timing, patent. 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 Delayed phase: No parenchymal contrast enhancement. Review of the MIP  images confirms the above findings IMPRESSION: 1. No emergent large vessel occlusion. 2. Approximately 60% stenosis of the proximal right internal carotid artery secondary to atherosclerotic calcification. 3. Very diminutive left posterior cerebral artery arising from a small posterior communicating artery. 4. 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 A diminutive vessel appears to arise from the apex, but connection to the basilar artery is not visualized. Electronically Signed   By: Ulyses Jarred M.D.   On: 10/05/2016 21:02   Ct Head Wo Contrast  Result Date: 10/05/2016 CLINICAL DATA:  Right-sided weakness and numbness. Tingling in the arm and hand. Symptoms began at 9 o'clock a.m. today. EXAM: CT HEAD WITHOUT CONTRAST TECHNIQUE: Contiguous axial images were obtained from the base of the skull through the vertex without intravenous contrast. COMPARISON:  None. FINDINGS: Brain: A remote white matter infarct is noted adjacent to the frontal horn of the right lateral ventricle. This involves the anterior aspect of the corpus callosum as well. No other acute or focal infarct is present. No discrete cortical lesions are noted. The basal ganglia are intact. The insular ribbon is normal. Ventricles are of normal size. No significant extra-axial fluid collection is present. Vascular: Atherosclerotic calcifications are present within the cavernous internal carotid arteries bilaterally. There is no hyperdense vessel. Skull: The calvarium is intact. No focal lytic or blastic lesions are present. Sinuses/Orbits: The paranasal sinuses and mastoid air cells are clear. The globes and orbits are within normal limits. IMPRESSION: 1. 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 2. Otherwise negative CT of the head. Electronically Signed   By: San Morelle M.D.   On: 10/05/2016 14:42   Ct Angio Neck W Or Wo Contrast  Result Date: 10/05/2016 CLINICAL DATA:  Right-sided leg numbness EXAM: CT ANGIOGRAPHY HEAD AND NECK TECHNIQUE: Multidetector CT imaging of the head and neck was performed using the standard protocol during bolus administration of intravenous  contrast. Multiplanar CT image reconstructions and MIPs were obtained to evaluate the vascular anatomy. Carotid stenosis measurements (when applicable) are obtained utilizing NASCET criteria, using the distal internal carotid diameter as the denominator. CONTRAST:  50 mL Isovue 370 COMPARISON:  Head CT 10/05/2016 FINDINGS: CTA NECK FINDINGS Aortic arch: There is no aneurysm or dissection of the visualized ascending aorta or aortic arch. There is a normal 3 vessel branching pattern. The visualized proximal subclavian arteries are normal. There is aortic arch atherosclerosis. Right carotid system: The right common carotid origin is widely patent. There is no common carotid or internal carotid artery dissection or aneurysm. Atherosclerotic calcification of the right carotid bifurcation results in approximately 60% proximal right ICA stenosis. Left carotid system: The origin of the left common carotid artery in the remainder of the common carotid course are normal. At the carotid bifurcation, there is atherosclerotic calcification the results in less than 50% stenosis. No dissection or aneurysm. Vertebral arteries: The vertebral system is codominant. Both vertebral artery origins are normal. Both vertebral arteries are normal to their confluence with the basilar artery. Skeleton: There is no bony spinal canal stenosis. No lytic or blastic lesions. Other neck: The nasopharynx is clear. The oropharynx and hypopharynx are normal. The epiglottis is normal. The supraglottic larynx, glottis and subglottic larynx are normal. No retropharyngeal collection. The parapharyngeal spaces are preserved. The parotid and submandibular glands are normal. No sialolithiasis or salivary  ductal dilatation. The thyroid gland is normal. There is no cervical lymphadenopathy. Upper chest: No pneumothorax or pleural effusion. No nodules or masses. Review of the MIP images confirms the above findings CTA HEAD FINDINGS Anterior circulation:  --Intracranial internal carotid arteries: There is atherosclerotic calcification of both lacerum and cavernous segments without advanced stenosis. 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 The distal aspect of the vessel is diminutive and not clearly visualized. --Anterior cerebral arteries: Normal. --Middle cerebral arteries: Normal. --Posterior communicating arteries: There is a small P-comm present on the left. Posterior circulation: --Posterior cerebral arteries: The left posterior cerebral artery is severely diminutive. The right is normal. --Superior cerebellar arteries: Normal on the right. Diminutive on the left. --Basilar artery: Normal. --Anterior inferior cerebellar arteries: Normal. --Posterior inferior cerebellar arteries: Normal. Venous sinuses: As permitted by contrast timing, patent. 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 Delayed phase: No parenchymal contrast enhancement. Review of the MIP images confirms the above findings IMPRESSION: 1. No emergent large vessel occlusion. 2. Approximately 60% stenosis of the proximal right internal carotid artery secondary to atherosclerotic calcification. 3. Very diminutive left posterior cerebral artery arising from a small posterior communicating artery. 4. 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 A diminutive vessel appears to arise from the apex, but connection to the basilar artery is not visualized. Electronically Signed   By: Ulyses Jarred M.D.   On: 10/05/2016 21:02   Mr Jodene Nam Neck W Wo Contrast  Result Date: 10/06/2016 CLINICAL DATA:  61 y/o  F; right-sided numbness. EXAM: MR HEAD WITHOUT CONTRAST MRA HEAD WITHOUT CONTRAST MRA OF THE NECK  WITHOUT AND WITH CONTRAST TECHNIQUE: Multiplanar, multiecho pulse sequences of the brain and surrounding structures were obtained without intravenous contrast. Angiographic images of the neck were obtained using MRA technique without and with intravenous contrast. Angiographic images of the head were obtained using MRA technique without intravenous contrast. CONTRAST:  6m MULTIHANCE GADOBENATE DIMEGLUMINE 529 MG/ML IV SOLN COMPARISON:  CT of the head and CT angiogram of head and neck dated 10/05/2016. FINDINGS: MR HEAD FINDINGS Brain: 7 mm focus of reduced diffusion within the left posterolateral pons (series 3, image 16) compatible with acute/ early subacute infarction. Few nonspecific foci of T2 FLAIR hyperintense signal abnormality in subcortical and periventricular white matter compatible with mild chronic microvascular ischemic changes. Small chronic infarction in right frontal periventricular white matter. No abnormal susceptibility hypointensity to indicate intracranial hemorrhage. No hydrocephalus or extra-axial collection. Vascular: As below. Skull and upper cervical spine: Normal marrow signal. Sinuses/Orbits: Left posterior ethmoid air cell mucosal thickening. Right maxillary sinus mucous retention cyst. Otherwise no significant abnormal signal of paranasal sinuses or mastoid air cells. Other: None. MRA HEAD FINDINGS Internal carotid arteries: Patent. Left cavernous ICA posterior outpouching seen on CT is not appreciable on MRI. Anterior cerebral arteries:  Patent. Middle cerebral arteries: Patent. Anterior communicating artery: Patent. Posterior communicating arteries:  Patent. Posterior cerebral arteries: Normal right PCA flow signal. Poor flow signal within the left posterior cerebral artery with severe proximal stenosis on CT angiogram of head. Diminutive left P1 segment and prominent left posterior communicating artery. Basilar artery:  Patent. Vertebral arteries:  Patent. No evidence of high-grade  stenosis, large vessel occlusion, or aneurysm unless noted  above. MRA NECK FINDINGS Aortic arch: Patent. Right common carotid artery: Patent. Right internal carotid artery: Proximal ICA moderate 50-60% stenosis. Right vertebral artery: Patent. Left common carotid artery: Patent. Left Internal carotid artery: Proximal ICA mild 30-40% stenosis. Left Vertebral artery: Patent. There is no evidence of high-grade stenosis, dissection, or aneurysm unless noted above. IMPRESSION: 1. Subcentimeter focus of acute/early subacute infarction within the left posterior pons. No acute intracranial hemorrhage. 2. Mild chronic microvascular ischemic changes and mild parenchymal volume loss of the brain. 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 3. Poor flow related signal within the left posterior cerebral artery with diminutive vessel/severe stenosis on same-day CT angiogram of head. 4. Left cavernous ICA outpouching on CT angiogram is not appreciable on MR angiogram. 5. Otherwise patent circle of Willis without evidence for high-grade stenosis, large vessel occlusion, or aneurysm. 6. Right proximal internal carotid artery moderate 50-60% stenosis. Left proximal internal carotid artery mild 30-40% stenosis. Patent codominant vertebral arteries. These results will be called to the ordering clinician or representative by the Radiologist Assistant, and communication documented in the PACS or zVision Dashboard. Electronically Signed   By: Mitzi Hansen M.D.   On: 10/06/2016 01:24   Mr Brain Wo Contrast  Result Date: 10/06/2016 CLINICAL DATA:  61 y/o  F; right-sided numbness. EXAM: MR HEAD WITHOUT CONTRAST MRA HEAD WITHOUT CONTRAST MRA OF THE NECK WITHOUT AND WITH CONTRAST TECHNIQUE: Multiplanar, multiecho pulse sequences of the brain and surrounding structures were obtained without intravenous contrast. Angiographic images of the neck were obtained using MRA technique without and with  intravenous contrast. Angiographic images of the head were obtained using MRA technique without intravenous contrast. CONTRAST:  31mL MULTIHANCE GADOBENATE DIMEGLUMINE 529 MG/ML IV SOLN COMPARISON:  CT of the head and CT angiogram of head and neck dated 10/05/2016. FINDINGS: MR HEAD FINDINGS Brain: 7 mm focus of reduced diffusion within the left posterolateral pons (series 3, image 16) compatible with acute/ early subacute infarction. Few nonspecific foci of T2 FLAIR hyperintense signal abnormality in subcortical and periventricular white matter compatible with mild chronic microvascular ischemic changes. Small chronic infarction in right frontal periventricular white matter. No abnormal susceptibility hypointensity to indicate intracranial hemorrhage. No hydrocephalus or extra-axial collection. Vascular: As below. Skull and upper cervical spine: Normal marrow signal. Sinuses/Orbits: Left posterior ethmoid air cell mucosal thickening. Right maxillary sinus mucous retention cyst. Otherwise no significant abnormal signal of paranasal sinuses or mastoid air cells. Other: None. MRA HEAD FINDINGS Internal carotid arteries: Patent. Left cavernous ICA posterior outpouching seen on CT is not appreciable on MRI. Anterior cerebral arteries:  Patent. Middle cerebral arteries: Patent. Anterior communicating artery: Patent. Posterior communicating arteries:  Patent. Posterior cerebral arteries: Normal right PCA flow signal. Poor flow signal within the left posterior cerebral artery with severe proximal stenosis on CT angiogram of head. Diminutive left P1 segment and prominent left posterior communicating artery. Basilar artery:  Patent. Vertebral arteries:  Patent. No evidence of high-grade stenosis, large vessel occlusion, or aneurysm unless noted above. MRA NECK FINDINGS Aortic arch: Patent. Right common carotid artery: Patent. Right internal carotid artery: Proximal ICA moderate 50-60% stenosis. Right vertebral artery:  Patent. Left common carotid artery: Patent. Left Internal carotid artery: Proximal ICA mild 30-40% stenosis. Left Vertebral artery: Patent. There is no evidence of high-grade stenosis, dissection, or aneurysm unless noted above. IMPRESSION: 1. Subcentimeter focus of acute/early subacute infarction within the left posterior pons. No acute intracranial hemorrhage. 2. Mild chronic microvascular ischemic changes and mild parenchymal volume loss  of the brain. 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 3. Poor flow related signal within the left posterior cerebral artery with diminutive vessel/severe stenosis on same-day CT angiogram of head. 4. Left cavernous ICA outpouching on CT angiogram is not appreciable on MR angiogram. 5. Otherwise patent circle of Willis without evidence for high-grade stenosis, large vessel occlusion, or aneurysm. 6. Right proximal internal carotid artery moderate 50-60% stenosis. Left proximal internal carotid artery mild 30-40% stenosis. Patent codominant vertebral arteries. These results will be called to the ordering clinician or representative by the Radiologist Assistant, and communication documented in the PACS or zVision Dashboard. Electronically Signed   By: Kristine Garbe M.D.   On: 10/06/2016 01:24   Mr Jodene Nam Head/brain TL Cm  Result Date: 10/06/2016 CLINICAL DATA:  61 y/o  F; right-sided numbness. EXAM: MR HEAD WITHOUT CONTRAST MRA HEAD WITHOUT CONTRAST MRA OF THE NECK WITHOUT AND WITH CONTRAST TECHNIQUE: Multiplanar, multiecho pulse sequences of the brain and surrounding structures were obtained without intravenous contrast. Angiographic images of the neck were obtained using MRA technique without and with intravenous contrast. Angiographic images of the head were obtained using MRA technique without intravenous contrast. CONTRAST:  83m MULTIHANCE GADOBENATE DIMEGLUMINE 529 MG/ML IV SOLN COMPARISON:  CT of the head and CT angiogram of head and  neck dated 10/05/2016. FINDINGS: MR HEAD FINDINGS Brain: 7 mm focus of reduced diffusion within the left posterolateral pons (series 3, image 16) compatible with acute/ early subacute infarction. Few nonspecific foci of T2 FLAIR hyperintense signal abnormality in subcortical and periventricular white matter compatible with mild chronic microvascular ischemic changes. Small chronic infarction in right frontal periventricular white matter. No abnormal susceptibility hypointensity to indicate intracranial hemorrhage. No hydrocephalus or extra-axial collection. Vascular: As below. Skull and upper cervical spine: Normal marrow signal. Sinuses/Orbits: Left posterior ethmoid air cell mucosal thickening. Right maxillary sinus mucous retention cyst. Otherwise no significant abnormal signal of paranasal sinuses or mastoid air cells. Other: None. MRA HEAD FINDINGS Internal carotid arteries: Patent. Left cavernous ICA posterior outpouching seen on CT is not appreciable on MRI. Anterior cerebral arteries:  Patent. Middle cerebral arteries: Patent. Anterior communicating artery: Patent. Posterior communicating arteries:  Patent. Posterior cerebral arteries: Normal right PCA flow signal. Poor flow signal within the left posterior cerebral artery with severe proximal stenosis on CT angiogram of head. Diminutive left P1 segment and prominent left posterior communicating artery. Basilar artery:  Patent. Vertebral arteries:  Patent. No evidence of high-grade stenosis, large vessel occlusion, or aneurysm unless noted above. MRA NECK FINDINGS Aortic arch: Patent. Right common carotid artery: Patent. Right internal carotid artery: Proximal ICA moderate 50-60% stenosis. Right vertebral artery: Patent. Left common carotid artery: Patent. Left Internal carotid artery: Proximal ICA mild 30-40% stenosis. Left Vertebral artery: Patent. There is no evidence of high-grade stenosis, dissection, or aneurysm unless noted above. IMPRESSION: 1.  Subcentimeter focus of acute/early subacute infarction within the left posterior pons. No acute intracranial hemorrhage. 2. Mild chronic microvascular ischemic changes and mild parenchymal volume loss of the brain. 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 3. Poor flow related signal within the left posterior cerebral artery with diminutive vessel/severe stenosis on same-day CT angiogram of head. 4. Left cavernous ICA outpouching on CT angiogram is not appreciable on MR angiogram. 5. Otherwise patent circle of Willis without evidence for high-grade stenosis, large vessel occlusion, or aneurysm. 6. Right proximal internal carotid artery moderate 50-60% stenosis. Left proximal internal carotid artery mild 30-40% stenosis. Patent codominant vertebral  arteries. These results will be called to the ordering clinician or representative by the Radiologist Assistant, and communication documented in the PACS or zVision Dashboard. Electronically Signed   By: Kristine Garbe M.D.   On: 10/06/2016 01:24    echocardiogram    LV EF: 60% -   65%  ------------------------------------------------------------------- Indications:      Stroke 434.91.  ------------------------------------------------------------------- History:   PMH:  Bilateral carotid artery stenosis  Ataxia.  Risk factors:  Hypertension. Diabetes mellitus. Dyslipidemia.  ------------------------------------------------------------------- Study Conclusions  - Left ventricle: The cavity size was normal. Wall thickness was   normal. Systolic function was normal. The estimated ejection   fraction was in the range of 60% to 65%. Wall motion was normal;   there were no regional wall motion abnormalities. There was a   borderline abnormality in the ratio of early to atrial left   ventricular filling. - Atrial septum: No defect or patent foramen ovale was identified.  Filed Weights   10/05/16 1253 10/06/16  0130  Weight: 84.4 kg (186 lb) 84 kg (185 lb 1.6 oz)     Microbiology: No results found for this or any previous visit (from the past 240 hour(s)).     Blood Culture No results found for: SDES, SPECREQUEST, CULT, REPTSTATUS    Labs: Results for orders placed or performed during the hospital encounter of 10/05/16 (from the past 48 hour(s))  Protime-INR     Status: None   Collection Time: 10/05/16 12:03 PM  Result Value Ref Range   Prothrombin Time 13.6 11.4 - 15.2 seconds   INR 1.03   APTT     Status: None   Collection Time: 10/05/16 12:03 PM  Result Value Ref Range   aPTT 36 24 - 36 seconds  CBC     Status: None   Collection Time: 10/05/16 12:03 PM  Result Value Ref Range   WBC 5.8 4.0 - 10.5 K/uL   RBC 4.39 3.87 - 5.11 MIL/uL   Hemoglobin 13.9 12.0 - 15.0 g/dL   HCT 41.0 36.0 - 46.0 %   MCV 93.4 78.0 - 100.0 fL   MCH 31.7 26.0 - 34.0 pg   MCHC 33.9 30.0 - 36.0 g/dL   RDW 13.0 11.5 - 15.5 %   Platelets 261 150 - 400 K/uL  Differential     Status: None   Collection Time: 10/05/16 12:03 PM  Result Value Ref Range   Neutrophils Relative % 71 %   Neutro Abs 4.1 1.7 - 7.7 K/uL   Lymphocytes Relative 20 %   Lymphs Abs 1.2 0.7 - 4.0 K/uL   Monocytes Relative 6 %   Monocytes Absolute 0.3 0.1 - 1.0 K/uL   Eosinophils Relative 3 %   Eosinophils Absolute 0.2 0.0 - 0.7 K/uL   Basophils Relative 0 %   Basophils Absolute 0.0 0.0 - 0.1 K/uL  Comprehensive metabolic panel     Status: Abnormal   Collection Time: 10/05/16 12:03 PM  Result Value Ref Range   Sodium 135 135 - 145 mmol/L   Potassium 3.8 3.5 - 5.1 mmol/L   Chloride 96 (L) 101 - 111 mmol/L   CO2 26 22 - 32 mmol/L   Glucose, Bld 357 (H) 65 - 99 mg/dL   BUN 12 6 - 20 mg/dL   Creatinine, Ser 1.10 (H) 0.44 - 1.00 mg/dL   Calcium 9.7 8.9 - 10.3 mg/dL   Total Protein 7.8 6.5 - 8.1 g/dL   Albumin 4.2 3.5 - 5.0 g/dL  AST 50 (H) 15 - 41 U/L   ALT 69 (H) 14 - 54 U/L   Alkaline Phosphatase 94 38 - 126 U/L   Total  Bilirubin 0.5 0.3 - 1.2 mg/dL   GFR calc non Af Amer 53 (L) >60 mL/min   GFR calc Af Amer >60 >60 mL/min    Comment: (NOTE) The eGFR has been calculated using the CKD EPI equation. This calculation has not been validated in all clinical situations. eGFR's persistently <60 mL/min signify possible Chronic Kidney Disease.    Anion gap 13 5 - 15  I-stat troponin, ED     Status: None   Collection Time: 10/05/16  1:34 PM  Result Value Ref Range   Troponin i, poc 0.00 0.00 - 0.08 ng/mL   Comment 3            Comment: Due to the release kinetics of cTnI, a negative result within the first hours of the onset of symptoms does not rule out myocardial infarction with certainty. If myocardial infarction is still suspected, repeat the test at appropriate intervals.   I-Stat Chem 8, ED     Status: Abnormal   Collection Time: 10/05/16  1:37 PM  Result Value Ref Range   Sodium 136 135 - 145 mmol/L   Potassium 3.9 3.5 - 5.1 mmol/L   Chloride 97 (L) 101 - 111 mmol/L   BUN 14 6 - 20 mg/dL   Creatinine, Ser 1.10 (H) 0.44 - 1.00 mg/dL   Glucose, Bld 367 (H) 65 - 99 mg/dL   Calcium, Ion 1.10 (L) 1.15 - 1.40 mmol/L   TCO2 29 0 - 100 mmol/L   Hemoglobin 13.9 12.0 - 15.0 g/dL   HCT 41.0 36.0 - 46.0 %  Hemoglobin A1c     Status: Abnormal   Collection Time: 10/06/16  3:33 AM  Result Value Ref Range   Hgb A1c MFr Bld 7.3 (H) 4.8 - 5.6 %    Comment: (NOTE)         Pre-diabetes: 5.7 - 6.4         Diabetes: >6.4         Glycemic control for adults with diabetes: <7.0    Mean Plasma Glucose 163 mg/dL    Comment: (NOTE) Performed At: [facility] [place], [place] [id number] Lindon Romp MD WU:[id number]   Lipid panel     Status: Abnormal   Collection Time: 10/06/16  3:33 AM  Result Value Ref Range   Cholesterol 187 0 - 200 mg/dL   Triglycerides 155 (H) <150 mg/dL   HDL 37 (L) >40 mg/dL   Total CHOL/HDL Ratio 5.1 RATIO   VLDL 31 0 - 40 mg/dL   LDL Cholesterol 119 (H)  0 - 99 mg/dL    Comment:        Total Cholesterol/HDL:CHD Risk Coronary Heart Disease Risk Table                     Men   Women  1/2 Average Risk   3.4   3.3  Average Risk       5.0   4.4  2 X Average Risk   9.6   7.1  3 X Average Risk  23.4   11.0        Use the calculated Patient Ratio above and the CHD Risk Table to determine the patient's CHD Risk.        ATP III CLASSIFICATION (LDL):  <100  mg/dL   Optimal  100-129  mg/dL   Near or Above                    Optimal  130-159  mg/dL   Borderline  160-189  mg/dL   High  >190     mg/dL   Very High   HIV antibody (Routine Testing)     Status: None   Collection Time: 10/06/16  3:33 AM  Result Value Ref Range   HIV Screen 4th Generation wRfx Non Reactive Non Reactive    Comment: (NOTE) Performed At: [facility] [place], [place] [id number] Lindon Romp MD KN:[id number]   CBC     Status: None   Collection Time: 10/06/16  3:33 AM  Result Value Ref Range   WBC 7.1 4.0 - 10.5 K/uL   RBC 3.98 3.87 - 5.11 MIL/uL   Hemoglobin 12.1 12.0 - 15.0 g/dL   HCT 37.2 36.0 - 46.0 %   MCV 93.5 78.0 - 100.0 fL   MCH 30.4 26.0 - 34.0 pg   MCHC 32.5 30.0 - 36.0 g/dL   RDW 13.0 11.5 - 15.5 %   Platelets 255 150 - 400 K/uL  Basic metabolic panel     Status: Abnormal   Collection Time: 10/06/16  3:33 AM  Result Value Ref Range   Sodium 136 135 - 145 mmol/L   Potassium 3.3 (L) 3.5 - 5.1 mmol/L   Chloride 100 (L) 101 - 111 mmol/L   CO2 26 22 - 32 mmol/L   Glucose, Bld 239 (H) 65 - 99 mg/dL   BUN 10 6 - 20 mg/dL   Creatinine, Ser 0.93 0.44 - 1.00 mg/dL   Calcium 9.3 8.9 - 10.3 mg/dL   GFR calc non Af Amer >60 >60 mL/min   GFR calc Af Amer >60 >60 mL/min    Comment: (NOTE) The eGFR has been calculated using the CKD EPI equation. This calculation has not been validated in all clinical situations. eGFR's persistently <60 mL/min signify possible Chronic Kidney Disease.    Anion gap 10 5 - 15  Glucose,  capillary     Status: Abnormal   Collection Time: 10/06/16  4:01 AM  Result Value Ref Range   Glucose-Capillary 238 (H) 65 - 99 mg/dL   Comment 1 Notify RN    Comment 2 Document in Chart   Glucose, capillary     Status: Abnormal   Collection Time: 10/06/16  9:14 AM  Result Value Ref Range   Glucose-Capillary 265 (H) 65 - 99 mg/dL  Glucose, capillary     Status: Abnormal   Collection Time: 10/06/16  1:24 PM  Result Value Ref Range   Glucose-Capillary 294 (H) 65 - 99 mg/dL  Glucose, capillary     Status: Abnormal   Collection Time: 10/06/16  4:13 PM  Result Value Ref Range   Glucose-Capillary 144 (H) 65 - 99 mg/dL  Glucose, capillary     Status: Abnormal   Collection Time: 10/06/16  8:08 PM  Result Value Ref Range   Glucose-Capillary 142 (H) 65 - 99 mg/dL   Comment 1 Notify RN    Comment 2 Document in Chart   Glucose, capillary     Status: Abnormal   Collection Time: 10/07/16 12:25 AM  Result Value Ref Range   Glucose-Capillary 152 (H) 65 - 99 mg/dL   Comment 1 Notify RN    Comment 2 Document in Chart   Comprehensive metabolic panel  Status: Abnormal   Collection Time: 10/07/16  3:17 AM  Result Value Ref Range   Sodium 137 135 - 145 mmol/L   Potassium 3.7 3.5 - 5.1 mmol/L   Chloride 101 101 - 111 mmol/L   CO2 26 22 - 32 mmol/L   Glucose, Bld 119 (H) 65 - 99 mg/dL   BUN 8 6 - 20 mg/dL   Creatinine, Ser 0.80 0.44 - 1.00 mg/dL   Calcium 9.5 8.9 - 10.3 mg/dL   Total Protein 7.4 6.5 - 8.1 g/dL   Albumin 3.8 3.5 - 5.0 g/dL   AST 42 (H) 15 - 41 U/L   ALT 67 (H) 14 - 54 U/L   Alkaline Phosphatase 71 38 - 126 U/L   Total Bilirubin 0.7 0.3 - 1.2 mg/dL   GFR calc non Af Amer >60 >60 mL/min   GFR calc Af Amer >60 >60 mL/min    Comment: (NOTE) The eGFR has been calculated using the CKD EPI equation. This calculation has not been validated in all clinical situations. eGFR's persistently <60 mL/min signify possible Chronic Kidney Disease.    Anion gap 10 5 - 15  CBC      Status: None   Collection Time: 10/07/16  3:17 AM  Result Value Ref Range   WBC 6.4 4.0 - 10.5 K/uL   RBC 4.12 3.87 - 5.11 MIL/uL   Hemoglobin 12.9 12.0 - 15.0 g/dL   HCT 38.2 36.0 - 46.0 %   MCV 92.7 78.0 - 100.0 fL   MCH 31.3 26.0 - 34.0 pg   MCHC 33.8 30.0 - 36.0 g/dL   RDW 12.7 11.5 - 15.5 %   Platelets 276 150 - 400 K/uL  Glucose, capillary     Status: Abnormal   Collection Time: 10/07/16  4:30 AM  Result Value Ref Range   Glucose-Capillary 143 (H) 65 - 99 mg/dL   Comment 1 Notify RN    Comment 2 Document in Chart   Glucose, capillary     Status: Abnormal   Collection Time: 10/07/16  7:23 AM  Result Value Ref Range   Glucose-Capillary 138 (H) 65 - 99 mg/dL     Lipid Panel     Component Value Date/Time   CHOL 187 10/06/2016 0333   TRIG 155 (H) 10/06/2016 0333   HDL 37 (L) 10/06/2016 0333   CHOLHDL 5.1 10/06/2016 0333   VLDL 31 10/06/2016 0333   LDLCALC 119 (H) 10/06/2016 0333     Lab Results  Component Value Date   HGBA1C 7.3 (H) 10/06/2016       HPI :  61 y.o. female with medical history significant of HTN, DM type II, Lt sided bell's palsy; who presents with complaints of right-sided numbness since this morning at approximately 9 AM. Patient reports getting out of bed and after bending over to pick something off the floor feeling as though her right leg was sleep. She's tried stopping the affected leg on the floor without change in symptoms. As time progressed patient noted that her whole right side felt numb which was unusual for her. Associated symptoms included feeling off balance and feeling as though she is limping on her right side. Denies having any significant weakness, slurred speech, headache, change in vision, chest pain, palpitations, lightheadedness, fall, fever, shortness of breath chills, nausea, vomiting, or diarrhea. Patient notes that blood sugars have been intermittently elevated at home into the 190s. While checking her blood pressures at home  she noted her systolic blood pressures were  elevated into the 170s which is unusual for her.  ED Course: Upon admission to the emergency department patient was noted to have a blood pressures elevated to 203/89. Initial CT imaging did not show any acute signs of stroke. Patient was not a candidate for TPA.  HOSPITAL COURSE:   Stroke:  left pontine infarct secondary to small vessel disease source.  Resultant  R hemiparesthesia   CT head. Old right frontal white matter infarct.   CT angiogram of head and neck no LVO. Right ICA 60% and left ICA 50% stenosis. b/l ICA siphon atherosclerosis. Left PCA stenosis  MRI brain, MRA head, MRA neck left pontine infarct. Small vessel disease. Atrophy. Old right frontal periventricular white matter infarct. Right ICA 50-60% stenosis. Left ICA 30-40% stenosis. Left PCA high grade stenosis. no LVO.   2D Echo  results as above  LDL 119  HgbA1c  7.3  Lovenox 40 mg sq daily for VTE prophylaxis  Diet heart healthy/carb modified Room service appropriate? Yes; Fluid consistency: Thin  No antithrombotic prior to admission, now on aspirin 325 mg daily and plavix '75mg'$  daily. Continue DAPT for 3 months and then plavix alone.   Outpatient PT, no OT  Disposition:  Return home  Carotid stenosis  b/l carotid stenosis  CTA and MRA suggest 50-60% stenosis bilaterally  Asymptomatic this time  Recommend outpt vascular surgery referral.   Dyslipidemia Increased Lipitor from 20->40 mg a day  Diabetes mellitus type 2: Poorly controlled initial blood glucose 357 on admission. No hemoglobin A1c on file. - Hypoglycemic protocol Resume glipizide, Farxigaand metformin A1c 7.3  Essential hypertension Continue antihypertensive regimen at home Resume Diovan-HCTZ once electrolytes are checked by PCP   Obesity, Body mass index is 32.79 kg/m., recommend weight loss, diet and exercise as appropriate         Discharge Exam:  Blood pressure (!)  136/47, pulse 66, temperature 97.7 F (36.5 C), temperature source Oral, resp. rate 18, height '5\' 3"'$  (1.6 m), weight 84 kg (185 lb 1.6 oz), SpO2 96 %.      Follow-up [place] Follow up on 10/09/2016.   Why:  Your appointment time is 8:30 am. Please arrive 15 min early. Please bring your picture ID and your current medications.  Contact information: [place] [phone]       Dennie Bible, NP. Schedule an appointment as soon as possible for a visit in 6 week(s).   Specialty:  Family Medicine Contact information: [address] [place] [address] [phone]           Signed: Reyne Dumas 10/07/2016, 8:46 AM        Time spent >45 mins

## 2016-10-07 NOTE — Progress Notes (Signed)
*  PRELIMINARY RESULTS* Echocardiogram 2D Echocardiogram has been performed.  Breanna Obrien 10/07/2016, 3:27 PM

## 2016-10-07 NOTE — Progress Notes (Signed)
Discharge instructions given to patient.  Family at bedside.  All questions and concerns addressed.

## 2016-10-07 NOTE — Plan of Care (Signed)
Problem: Coping: Goal: Ability to identify strategies to decrease anxiety will improve Outcome: Not Applicable Date Met: 74/82/70 Patient is in no distress and shows no signs of anxiety. RN will continue to monitor.  Problem: Tissue Perfusion: Goal: Cerebral tissue perfusion will improve (applicable to all stroke diagnoses) Outcome: Progressing Patient LLE sensation improving. RN will continue to monitor.

## 2016-10-09 ENCOUNTER — Ambulatory Visit: Payer: Self-pay | Attending: Internal Medicine | Admitting: Physician Assistant

## 2016-10-09 ENCOUNTER — Encounter: Payer: Self-pay | Admitting: Physician Assistant

## 2016-10-09 VITALS — BP 150/81 | HR 71 | Temp 98.2°F | Resp 16 | Ht 63.0 in | Wt 188.8 lb

## 2016-10-09 DIAGNOSIS — Z7982 Long term (current) use of aspirin: Secondary | ICD-10-CM | POA: Insufficient documentation

## 2016-10-09 DIAGNOSIS — E1151 Type 2 diabetes mellitus with diabetic peripheral angiopathy without gangrene: Secondary | ICD-10-CM | POA: Insufficient documentation

## 2016-10-09 DIAGNOSIS — E118 Type 2 diabetes mellitus with unspecified complications: Secondary | ICD-10-CM

## 2016-10-09 DIAGNOSIS — I10 Essential (primary) hypertension: Secondary | ICD-10-CM | POA: Insufficient documentation

## 2016-10-09 DIAGNOSIS — G510 Bell's palsy: Secondary | ICD-10-CM | POA: Insufficient documentation

## 2016-10-09 DIAGNOSIS — I639 Cerebral infarction, unspecified: Secondary | ICD-10-CM | POA: Insufficient documentation

## 2016-10-09 DIAGNOSIS — Z794 Long term (current) use of insulin: Secondary | ICD-10-CM | POA: Insufficient documentation

## 2016-10-09 DIAGNOSIS — I739 Peripheral vascular disease, unspecified: Secondary | ICD-10-CM | POA: Insufficient documentation

## 2016-10-09 LAB — GLUCOSE, POCT (MANUAL RESULT ENTRY): POC GLUCOSE: 214 mg/dL — AB (ref 70–99)

## 2016-10-09 MED ORDER — METFORMIN HCL 1000 MG PO TABS: 1000.0000 mg | ORAL_TABLET | Freq: Two times a day (BID) | ORAL | 3 refills | Status: DC

## 2016-10-09 MED ORDER — GLIPIZIDE 10 MG PO TABS: 10.0000 mg | ORAL_TABLET | Freq: Two times a day (BID) | ORAL | 3 refills | Status: DC

## 2016-10-09 MED ORDER — ATENOLOL 100 MG PO TABS: 100.0000 mg | ORAL_TABLET | Freq: Every evening | ORAL | 3 refills | Status: DC

## 2016-10-09 MED ORDER — AMLODIPINE BESYLATE 10 MG PO TABS: 10.0000 mg | ORAL_TABLET | Freq: Every day | ORAL | 3 refills | Status: DC

## 2016-10-09 MED ORDER — VALSARTAN-HYDROCHLOROTHIAZIDE 320-25 MG PO TABS: 1.0000 | ORAL_TABLET | Freq: Every day | ORAL | 3 refills | Status: DC

## 2016-10-09 NOTE — Progress Notes (Signed)
Breanna Obrien  NMM:[id number]  RPR:[id number]  DOB - [birth year]  Chief Complaint  Patient presents with  . Hospitalization Follow-up       Subjective:   Breanna Obrien is a 62 y.o. female here today for establishment of care. He carries a past metes mellitus and Bell's palsy.ved here 2 mm [place]. While in [place] she was under the care of a  doctor and her sugars and blood pressure were under decent control. She no longer has insurance since the transition to [place]. She presented to the emergency department on 10/05/2016 with right-sided numbness and loss of balance. Her systolic blood pressure on presentation was 203. The initial CT scan was negative. She did not meet criteria to receive TPA.  She was admitted by the internal medicine team for the usual CVA protocol/workup. Neurology was consulted. The only significant finding was an A of the neck showed 50-60% right internal stenosis and 30-40% stenosis with left PCA high-grade stenosis. Outpatient interventional radiology was recommended. Her LDL was 119. Her A1c was 7.3%.  Today she states that she feels numb along the right side and she still doesn't have her balance all the way. No headaches. No double vision. No slurred speech. She did get her new medications field but is out of her old medications and these were not addressed prior to discharge.    ROS: GEN: denies fever or chills, denies change in weight Skin: denies lesions or rashes HEENT: denies headache, earache, epistaxis, sore throat, or neck pain LUNGS: denies SHOB, dyspnea, PND, orthopnea CV: denies CP or palpitations ABD: denies abd pain, N or V EXT: denies muscle spasms or swelling; no pain in lower ext, no weakness NEURO: + numbness or tingling, denies sz, stroke or TIA   ALLERGIES: No Known Allergies  PAST MEDICAL HISTORY: Past Medical History:  Diagnosis Date  . Diabetes mellitus without complication ([place])   . Hypertension     PAST SURGICAL  HISTORY: Past Surgical History:  Procedure Laterality Date  . CHOLECYSTECTOMY    . CORONARY ANGIOPLASTY WITH STENT PLACEMENT      MEDICATIONS AT HOME: Prior to Admission medications   Medication Sig Start Date End Date Taking? Authorizing Provider  aspirin 325 MG tablet Take 1 tablet (325 mg total) by mouth daily. 10/07/16  Yes Reyne Dumas, MD  atenolol (TENORMIN) 100 MG tablet Take 1 tablet (100 mg total) by mouth every evening. 10/09/16  Yes Ena Dawley, Price Lachapelle S, PA-C  atorvastatin (LIPITOR) 40 MG tablet Take 1 tablet (40 mg total) by mouth daily at 6 PM. 10/07/16  Yes Reyne Dumas, MD  clopidogrel (PLAVIX) 75 MG tablet Take 1 tablet (75 mg total) by mouth daily. 10/07/16  Yes Reyne Dumas, MD  insulin glargine (LANTUS) 100 UNIT/ML injection Inject 14 Units into the skin at bedtime.   Yes [provider]  valsartan-hydrochlorothiazide (DIOVAN-HCT) 320-25 MG tablet Take 1 tablet by mouth daily. 10/16/16  Yes Ena Dawley, Clevester Helzer S, PA-C  amLODipine (NORVASC) 10 MG tablet Take 1 tablet (10 mg total) by mouth daily. 10/09/16   Brayton Caves, PA-C  calcium-vitamin D (OSCAL WITH D) 500-200 MG-UNIT tablet Take 1 tablet by mouth.    [provider]  cholecalciferol (VITAMIN D) 400 units TABS tablet Take 400 Units by mouth.    [provider]  co-enzyme Q-10 30 MG capsule Take 30 mg by mouth 3 (three) times daily.    [provider]  dapagliflozin propanediol (FARXIGA) 10 MG TABS tablet Take 10 mg by  mouth daily.    [provider]  glipiZIDE (GLUCOTROL) 10 MG tablet Take 1 tablet (10 mg total) by mouth 2 (two) times daily before a meal. 10/09/16   Brayton Caves, PA-C  metFORMIN (GLUCOPHAGE) 1000 MG tablet Take 1 tablet (1,000 mg total) by mouth 2 (two) times daily with a meal. 10/09/16   Brayton Caves, PA-C  niacin 500 MG tablet Take 500 mg by mouth at bedtime.    [provider]    Fam hx-DM, HTN  Social-married. Recently moved here from [place]. Works  PT at a daycare.  Objective:   Vitals:   10/09/16 1006  BP: (!) 150/81  Pulse: 71  Resp: 16  Temp: 98.2 F (36.8 C)  TempSrc: Oral  SpO2: 98%  Weight: 188 lb 12.8 oz (85.6 kg)  Height: _0  (1.6 m)    Exam General appearance : Awake, alert, not in any distress. Speech Clear. Not toxic looking HEENT: Atraumatic and Normocephalic, pupils equally reactive to light and accomodation Neck: supple, no JVD. No cervical lymphadenopathy.  Chest:Good air entry bilaterally, no added sounds  CVS: S1 S2 regular, no murmurs.  Abdomen: Bowel sounds present, Non tender and not distended with no guarding, rigidity or rebound. Extremities: B/L Lower Ext shows no edema, both legs are warm to touch Neurology: Awake alert, and oriented X 3, CN II-XII intact, Non focal Skin:No Rash Wounds:N/A  Data Review Lab Results  Component Value Date   HGBA1C 7.3 (H) 10/06/2016     Assessment & Plan  1. CVA  -DAPT for 3 months  -GDMT  -RF modification  -Outpt PT referral  -stroke clinic in 6 weeks 2. DM2  -Cont Lantus, Farxiga, Glip and Met   -low carb diet  -increase exercise as tolerated 3. HTN   -low salt  -Cont norvasc, BB 4. PVD  -Consideration for vasc surg referral at some point  -RF modification  Financial counselor appt  Return in about 2 weeks (around 10/23/2016).  The patient was given clear instructions to go to ER or return to medical center if symptoms don't improve, worsen or new problems develop. The patient verbalized understanding. The patient was told to call to get lab results if they haven't heard anything in the next week.   Total time spent with patient was 64mn (<15 level 2; 16-25 level 3, 26-30 level 4, >40 level 5). Greater than 50 % of this visit was spent face to face counseling and coordinating care regarding risk factor modification, compliance importance and encouragement, education related to CVA, DM, and HTN.  This note has been created with DBiomedical engineer Any transcriptional errors are unintentional.    TZettie Pho PA-C [facility] [place]  10/09/2016, 10:38 AM

## 2016-10-13 ENCOUNTER — Ambulatory Visit: Payer: Self-pay | Attending: Physician Assistant | Admitting: Physical Therapy

## 2016-10-13 ENCOUNTER — Encounter: Payer: Self-pay | Admitting: Physical Therapy

## 2016-10-13 VITALS — BP 128/78

## 2016-10-13 DIAGNOSIS — R2689 Other abnormalities of gait and mobility: Secondary | ICD-10-CM | POA: Insufficient documentation

## 2016-10-13 DIAGNOSIS — R2681 Unsteadiness on feet: Secondary | ICD-10-CM | POA: Insufficient documentation

## 2016-10-13 DIAGNOSIS — I69951 Hemiplegia and hemiparesis following unspecified cerebrovascular disease affecting right dominant side: Secondary | ICD-10-CM | POA: Insufficient documentation

## 2016-10-13 DIAGNOSIS — R208 Other disturbances of skin sensation: Secondary | ICD-10-CM | POA: Insufficient documentation

## 2016-10-13 NOTE — Therapy (Signed)
[facility] [address] [place], [place], [address] Phone: [phone]   Fax:  [phone]  Physical Therapy Evaluation  Patient Details  Name: Breanna Obrien MRN: [id number] Date of Birth: 07-30-1955 Referring Provider: Zettie Pho, PA  Encounter Date: 10/13/2016      PT End of Session - 10/13/16 1911    Visit Number 1   Number of Visits 9   Authorization Type self pay   PT Start Time 0925   PT Stop Time 1010   PT Time Calculation (min) 45 min   Activity Tolerance Patient tolerated treatment well   Behavior During Therapy [facility] for tasks assessed/performed      Past Medical History:  Diagnosis Date  . Diabetes mellitus without complication ([place])   . Hypertension     Past Surgical History:  Procedure Laterality Date  . CHOLECYSTECTOMY    . CORONARY ANGIOPLASTY WITH STENT PLACEMENT      Vitals:   10/13/16 0919  BP: 128/78         Subjective Assessment - 10/13/16 0924    Subjective I still have numbness on my entire right side. I still lose my balance. 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 No/denies            [facility] PT Assessment - 10/13/16 0867      Assessment   Medical Diagnosis lt pons CVA   Referring Provider Zettie Pho, PA   Onset Date/Surgical Date 10/06/10   Prior Therapy Acute only     Precautions   Precautions Fall     Balance Screen   Has the patient fallen in the past 6 months No   Has the patient had a decrease in activity level because of a fear of falling?  Yes   Is the patient reluctant to leave their home because of a fear of falling?  Yes  doing walking program inside home due to fear of falling out     [place] residence   Available Help at Discharge Family;Available 24 hours/day  spouse is retired   Type of [place] to enter   [employer] of Steps 3   Entrance Stairs-Rails None  holds onto husband   Home Layout Two level;Able to live on main level with bedroom/bathroom   Home Equipment None     Prior Function   Level of Independence Independent   Vocation Part time employment  childcare, toddlers     Cognition   Overall Cognitive Status Impaired/Different from baseline   Memory Impaired   Memory Impairment --  enters room and can't remember why she is there   Behaviors Lability  cried when made mistakes subtracting by 3s TUG cognitive     Observation/Other Assessments   Focus on Therapeutic Outcomes (FOTO)  FS 63 (FOTO risk adjusted 52)   Lower Extremity Functional Scale  39.1   Fear Avoidance Belief Questionnaire (FABQ)  44     Sensation   Light Touch --  intact entire foot/leg   Proprioception --  intact at ankle and knee     Coordination   Gross Motor Movements are Fluid and Coordinated Yes   Fine Motor Movements  are Fluid and Coordinated No  asynchronous RAM   Heel Shin Test bil equal     ROM / Strength   AROM / PROM / Strength AROM;Strength     AROM   Overall AROM  Within functional limits for tasks performed     Strength   Overall Strength Deficits  LLE WNL; RLE WNL except ankle DF 4/5, PF not tested     Transfers   Transfers Sit to Stand;Stand to Sit   Sit to Stand 6: Modified independent (Device/Increase time)   Five time sit to stand comments  using UEs for consistent ability   Stand to Sit 6: Modified independent (Device/Increase time)     Ambulation/Gait   Ambulation/Gait Yes   Ambulation/Gait Assistance 6: Modified independent (Device/Increase time)   Ambulation Distance (Feet) 120 Feet   Assistive device None   Gait Pattern Step-through pattern;Decreased arm swing - right;Decreased arm swing - left;Decreased step length -  right;Decreased step length - left;Decreased weight shift to right;Decreased trunk rotation;Wide base of support   Ambulation Surface Level   Gait velocity 1.46 ft/sec   Stairs Yes   Stairs Assistance 5: Supervision   Stairs Assistance Details (indicate cue type and reason) step to on descend, however using RLE to lower with quick descent   Stair Management Technique Two rails;Alternating pattern;Step to pattern;Forwards  alternate ascend; step to descend     Standardized Balance Assessment   Standardized Balance Assessment Timed Up and Go Test     Timed Up and Go Test   Normal TUG (seconds) 15.56   Cognitive TUG (seconds) 18.06  subtracting by3's inaccurate     Functional Gait  Assessment   Gait assessed  Yes   Gait Level Surface Walks 20 ft, slow speed, abnormal gait pattern, evidence for imbalance or deviates 10-15 in outside of the 12 in walkway width. Requires more than 7 sec to ambulate 20 ft.  13.72   Change in Gait Speed Makes only minor adjustments to walking speed, or accomplishes a change in speed with significant gait deviations, deviates 10-15 in outside the 12 in walkway width, or changes speed but loses balance but is able to recover and continue walking.   Gait with Horizontal Head Turns Performs head turns smoothly with slight change in gait velocity (eg, minor disruption to smooth gait path), deviates 6-10 in outside 12 in walkway width, or uses an assistive device.   Gait with Vertical Head Turns Performs task with slight change in gait velocity (eg, minor disruption to smooth gait path), deviates 6 - 10 in outside 12 in walkway width or uses assistive device   Gait and Pivot Turn Pivot turns safely in greater than 3 sec and stops with no loss of balance, or pivot turns safely within 3 sec and stops with mild imbalance, requires small steps to catch balance.   Step Over Obstacle Cannot perform without assistance.   Gait with Narrow Base of Support Ambulates less than 4  steps heel to toe or cannot perform without assistance.   Gait with Eyes Closed Cannot walk 20 ft without assistance, severe gait deviations or imbalance, deviates greater than 15 in outside 12 in walkway width or will not attempt task.   Ambulating Backwards Walks 20 ft, slow speed, abnormal gait pattern, evidence for imbalance, deviates 10-15 in outside 12 in walkway width.   Steps Two feet to a stair, must use rail.   Total Score 10  PT Education - 10/13/16 1909    Education provided Yes   Education Details re: exposing RUE to different textures (rub lotion on arm, smooth cloth, rough washcloth; use it to pick up things (if safe)   Person(s) Educated Patient;Spouse   Methods Explanation;Demonstration   Comprehension Verbalized understanding;Need further instruction          PT Short Term Goals - 10/13/16 1924      PT SHORT TERM GOAL #1   Title Patient will be independent with basic HEP for balance and strengthening. (TARGET date for all STGs 11/10/16)   Baseline no HEP; pt has been walking laps inside home   Time 4   Period Weeks   Status New     PT SHORT TERM GOAL #2   Title Patient will improve gait velocity to >1.81 ft/sec (threshold for increased risk of falls).    Time 4   Period Weeks   Status New     PT SHORT TERM GOAL #3   Title Patient will be able to carry toddler-size object weighing ~10 lbs x 120 ft with no LOB.    Time 4   Period Weeks   Status New     PT SHORT TERM GOAL #4   Title Patient will improve TUG normal to <13.5 ft/sec to demonstrate decreased risk of falling.    Time 4   Period Weeks   Status New           PT Long Term Goals - 10/13/16 1932      PT LONG TERM GOAL #1   Title Patient will be independent with updated HEP (Target for all LTGs 12/08/16)   Time 8   Period Weeks   Status New     PT LONG TERM GOAL #2   Title Patient will improve gait velocity to >2.0 ft/sec to demonstrate  progress towards ambulating at safe speed for a community ambulator.    Time 8   Period Weeks   Status New     PT LONG TERM GOAL #3   Title Patient will be able to carry toddler-size object weighing 20 lbs x 120 ft with no LOB.   Time 8   Period Months   Status New     PT LONG TERM GOAL #4   Title Patient's TUG cognitive will be within 10% of her TUG normal time demonstrating improved ability to multi-task while walking.   Time 8   Period Weeks   Status New               Plan - 10/13/16 1913    Clinical Impression Statement Patient presented for PT evaluation s/p acute Lt pons CVA (admitted to hospital 5/10-5/12/18) with residual paresthesias on Rt side of body, RLE weakness, impaired balance, gait deficits, and cognitive changes. Patient is highly motivated to improve her safety with mobility to allow return to work. Anticipate she will benefit from skilled PT.   Rehab Potential Good   PT Frequency 1x / week  recommended 2x/wk, however pt reports can only afford 1x/wk   PT Duration 8 weeks   PT Treatment/Interventions ADLs/Self Care Home Management;DME Instruction;Gait training;Stair training;Functional mobility training;Therapeutic activities;Therapeutic exercise;Balance training;Neuromuscular re-education;Cognitive remediation;Patient/family education   PT Next Visit Plan Pt only 1x/wk due to finances; initiate HEP (heel raises/toe raises, standing hip abdct, flex, extension, ? 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 Type 2 diabetes mellitus with complication, without long-term current use of insulin ([place]) 10/06/2016  . Essential hypertension 10/06/2016  . Ataxia   . Hyperlipidemia   . Bilateral carotid artery stenosis   . CVA (cerebral vascular accident) ([place]) 10/05/2016    Rexanne Mano, PT 10/13/2016, 7:50 PM  [facility] [address] [place], [place], [address] Phone: [phone]   Fax:  [phone]  Name: Breanna Obrien MRN: [id number] Date of Birth: 09-29-1955

## 2016-10-19 ENCOUNTER — Ambulatory Visit: Payer: Self-pay | Admitting: Physical Therapy

## 2016-10-19 ENCOUNTER — Encounter: Payer: Self-pay | Admitting: Physical Therapy

## 2016-10-19 DIAGNOSIS — I69951 Hemiplegia and hemiparesis following unspecified cerebrovascular disease affecting right dominant side: Secondary | ICD-10-CM

## 2016-10-19 DIAGNOSIS — R2681 Unsteadiness on feet: Secondary | ICD-10-CM

## 2016-10-19 DIAGNOSIS — R208 Other disturbances of skin sensation: Secondary | ICD-10-CM

## 2016-10-19 NOTE — Therapy (Signed)
[facility] [address] [place], [place], [address] Phone: [phone]   Fax:  [phone]  Physical Therapy Treatment  Patient Details  Name: Breanna Obrien MRN: [id number] Date of Birth: 03/09/1956 Referring Provider: Zettie Pho, PA  Encounter Date: 10/19/2016      PT End of Session - 10/19/16 [place]    Visit Number 2   Number of Visits 9   Date for PT Re-Evaluation 12/08/16   Authorization Type self pay   PT Start Time 0800   PT Stop Time 0846   PT Time Calculation (min) 46 min   Activity Tolerance Patient tolerated treatment well   Behavior During Therapy [facility] for tasks assessed/performed      Past Medical History:  Diagnosis Date  . Diabetes mellitus without complication ([place])   . Hypertension     Past Surgical History:  Procedure Laterality Date  . CHOLECYSTECTOMY    . CORONARY ANGIOPLASTY WITH STENT PLACEMENT      There were no vitals filed for this visit.      Subjective Assessment - 10/19/16 0800    Subjective I have more feeling in my feet. The tightness in my side keeps me awake. States she has been working on the sensation in her arm (lots of rubbing).    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 No/denies                         [facility] Adult PT Treatment/Exercise - 10/19/16 0001      Exercises   Exercises Knee/Hip     Knee/Hip Exercises: Standing   Hip Abduction Stengthening;Both;1 set;20 reps;Knee straight   Hip Extension Stengthening;Both;1 set;20 reps;Knee straight   Other Standing Knee Exercises forward and backward wt-shift with RLE behind (pt has difficulty placing RLE in back wards walking due to decr sensation)             Balance Exercises - 10/19/16 1155      Balance Exercises: Standing   Tandem Stance Eyes open;Upper extremity support 1;Intermittent upper extremity support;3  reps;10 secs;30 secs  bil; with single UE support up to 30; w/o up to 10   SLS Eyes open;Solid surface;Upper extremity support 1;Intermittent upper extremity support;3 reps;30 secs   Retro Gait Upper extremity support  6 lengths of countertop   Sidestepping Upper extremity support  6 lengths of counter   Heel Raises Limitations x 10 with 3 sec hold and single UE support   Toe Raise Limitations x 10 with 3 sec hold and single UE support   Other Standing Exercises braiding length of counter x 4            PT Education - 10/19/16 1828    Education provided Yes   Education Details see pt instructions for HEP   Person(s) Educated Patient   Methods Explanation;Demonstration;Tactile cues;Verbal cues;Handout   Comprehension Verbalized understanding;Returned demonstration;Verbal cues required;Tactile cues required;Need further instruction          PT Short Term Goals - 10/13/16 1924      PT SHORT TERM GOAL #1   Title Patient will be independent with basic HEP for balance and strengthening. (TARGET date for all STGs 11/10/16)   Baseline no HEP; pt has been walking laps inside home   Time 4   Period Weeks   Status New     PT SHORT  TERM GOAL #2   Title Patient will improve gait velocity to >1.81 ft/sec (threshold for increased risk of falls).    Time 4   Period Weeks   Status New     PT SHORT TERM GOAL #3   Title Patient will be able to carry toddler-size object weighing ~10 lbs x 120 ft with no LOB.    Time 4   Period Weeks   Status New     PT SHORT TERM GOAL #4   Title Patient will improve TUG normal to <13.5 ft/sec to demonstrate decreased risk of falling.    Time 4   Period Weeks   Status New           PT Long Term Goals - 10/13/16 1932      PT LONG TERM GOAL #1   Title Patient will be independent with updated HEP (Target for all LTGs 12/08/16)   Time 8   Period Weeks   Status New     PT LONG TERM GOAL #2   Title Patient will improve gait velocity to >2.0  ft/sec to demonstrate progress towards ambulating at safe speed for a community ambulator.    Time 8   Period Weeks   Status New     PT LONG TERM GOAL #3   Title Patient will be able to carry toddler-size object weighing 20 lbs x 120 ft with no LOB.   Time 8   Period Months   Status New     PT LONG TERM GOAL #4   Title Patient's TUG cognitive will be within 10% of her TUG normal time demonstrating improved ability to multi-task while walking.   Time 8   Period Weeks   Status New               Plan - 10/19/16 1830    Clinical Impression Statement Session focused on instructing patient in a HEP to address decreased strength and balance. Patient required 2 seated rest breaks due to "rt knee is tired." Due to decr sensation of RLE, pt with difficulty properly placing rt foot during backwards walking and decr ability to maintain her balance over a narrow BOS. Pt highly motivated and anticipate steady progress towards goals.    Rehab Potential Good   PT Frequency 1x / week  recommended 2x/wk, however pt reports can only afford 1x/wk   PT Duration 8 weeks   PT Treatment/Interventions ADLs/Self Care Home Management;DME Instruction;Gait training;Stair training;Functional mobility training;Therapeutic activities;Therapeutic exercise;Balance training;Neuromuscular re-education;Cognitive remediation;Patient/family education   PT Next Visit Plan Pt only 1x/wk due to finances; check for ?s re: HEP from 5/24 (heel raises/toe raises, standing hip abdct, flex, extension, tandem stance, SLS, backward walking and braiding)   Consulted and Agree with Plan of Care Patient      Patient will benefit from skilled therapeutic intervention in order to improve the following deficits and impairments:  Abnormal gait, Decreased balance, Decreased cognition, Decreased knowledge of use of DME, Decreased mobility, Decreased strength, Impaired sensation  Visit Diagnosis: Hemiplegia and hemiparesis following  unspecified cerebrovascular disease affecting right dominant side ([facility])  Other disturbances of skin sensation  Unsteadiness on feet     Problem List Patient Active Problem List   Diagnosis Date Noted  . Type 2 diabetes mellitus with complication, without long-term current use of insulin ([place]) 10/06/2016  . Essential hypertension 10/06/2016  . Ataxia   . Hyperlipidemia   . Bilateral carotid artery stenosis   . CVA (cerebral vascular accident) ([place])  10/05/2016    Rexanne Mano, PT 10/19/2016, 6:35 PM  [facility] [address] [place], [place], [address] Phone: [phone]   Fax:  [phone]  Name: Breanna Obrien MRN: [id number] Date of Birth: 18-May-1956

## 2016-10-19 NOTE — Patient Instructions (Signed)
FOR ALL BALANCE EXERCISES, USE AS LITTLE ARM/HAND SUPPORT AS YOU NEED. (TRY ONE FINGER SUPPORT).   ALWAYS DO THE BALANCE EXERCISES IN A CORNER WITH A CHAIR IN FRONT OF YOU FOR SUPPORT.    ANKLE: Plantarflexion, Bilateral - Standing    Stand with upright posture. Raise heels up as high as possible. Hold for 3 counts _10__ reps per set, _2__ sets per day, __5_ days per week Hold onto a support.  Copyright  VHI. All rights reserved.      Standing Marching   Using a chair if necessary, lift one leg up with knee bent. Hold for 3 counts.  Repeat 20 times. Do 2 sessions per day.  http://[email or web address].exer.us/344   Copyright  VHI. All rights reserved.   Hip Backward Kick   Using a chair for balance, keep legs shoulder width apart and toes pointed for- ward. Slowly extend one leg back, keeping knee straight. Do not lean forward. Repeat with other leg. Repeat 20 times. Do 2 sessions per day.  http://[email or web address].exer.us/340   Copyright  VHI. All rights reserved.     Hip Side Kick   Holding a chair for balance, keep legs shoulder width apart and toes pointed forward. Lift a leg out to side, keeping knee straight. Hold for 3 countsDo not lean. Repeat using other leg. Repeat 20 times. Do 2 sessions per day.  http://[email or web address].exer.us/342   Copyright  VHI. All rights reserved.   Feet Heel-Toe "Tandem", Varied Arm Positions - Eyes Open   With eyes open, right foot directly in front of the other, arms down by your sides, look straight ahead at a stationary object. Hold 30 seconds. Repeat 3  times per session. Repeat 3 times with left foot in front.  Do 2 sessions per day.  *You can start with feet slightly apart with one forward of the other and gradually work towards one foot directly in front of the other.   Copyright  VHI. All rights reserved.       Standing On One Leg Without Support .  Stand on one leg with your torso firm/tight with as little arm support as needed. Hold 10-30  seconds. Repeat on other leg. Do 3 repetitions, 2 sets.  http://bt.exer.us/36   Copyright  VHI. All rights reserved.    Side-Stepping   Walk to left side with eyes open. Take even steps, leading with same foot. Make sure each foot lifts off the floor. Repeat in opposite direction. Repeat for 5 reps per session. Do 2 sessions per day.  Copyright  VHI. All rights reserved.      Forward Weight Shift    Stand with upright posture. Left leg in front of right. Shift weight forward onto left leg, straighten hip and knee. Shift weight back onto right leg and lift your left toe up while keeping your left heel on the floor.  __10_ reps per set, _2__ sets per day, __5_ days per week. Hold onto a support (chair or countertop). Repeat with other leg.  Copyright  VHI. All rights reserved.        AMBULATION: Walk Backward   Walk backward. Do not drag feet. Make sure right foot is stepping straight (not turned in). Do 5 reps per set, 2 sets per day, 5 days per week   Copyright  VHI. All rights reserved.      Braiding   Move to side: 1) cross right leg in front of left, 2) bring back leg out to side, and repeat.  Continue  sequence in same direction. Reverse sequence, moving in opposite direction. Repeat sequence 5 times per session. Do 2 sessions per day..  Copyright  VHI. All rights reserved.

## 2016-10-24 ENCOUNTER — Ambulatory Visit: Payer: Self-pay | Attending: Internal Medicine | Admitting: Internal Medicine

## 2016-10-24 VITALS — BP 140/82 | HR 66 | Temp 97.4°F | Resp 16 | Wt 190.4 lb

## 2016-10-24 DIAGNOSIS — Z8673 Personal history of transient ischemic attack (TIA), and cerebral infarction without residual deficits: Secondary | ICD-10-CM | POA: Insufficient documentation

## 2016-10-24 DIAGNOSIS — Z23 Encounter for immunization: Secondary | ICD-10-CM

## 2016-10-24 DIAGNOSIS — Z955 Presence of coronary angioplasty implant and graft: Secondary | ICD-10-CM | POA: Insufficient documentation

## 2016-10-24 DIAGNOSIS — Z7982 Long term (current) use of aspirin: Secondary | ICD-10-CM | POA: Insufficient documentation

## 2016-10-24 DIAGNOSIS — Z794 Long term (current) use of insulin: Secondary | ICD-10-CM | POA: Insufficient documentation

## 2016-10-24 DIAGNOSIS — E785 Hyperlipidemia, unspecified: Secondary | ICD-10-CM | POA: Insufficient documentation

## 2016-10-24 DIAGNOSIS — I6523 Occlusion and stenosis of bilateral carotid arteries: Secondary | ICD-10-CM | POA: Insufficient documentation

## 2016-10-24 DIAGNOSIS — Z7902 Long term (current) use of antithrombotics/antiplatelets: Secondary | ICD-10-CM | POA: Insufficient documentation

## 2016-10-24 DIAGNOSIS — F32A Depression, unspecified: Secondary | ICD-10-CM | POA: Insufficient documentation

## 2016-10-24 DIAGNOSIS — I10 Essential (primary) hypertension: Secondary | ICD-10-CM | POA: Insufficient documentation

## 2016-10-24 DIAGNOSIS — G4701 Insomnia due to medical condition: Secondary | ICD-10-CM | POA: Insufficient documentation

## 2016-10-24 DIAGNOSIS — F329 Major depressive disorder, single episode, unspecified: Secondary | ICD-10-CM | POA: Insufficient documentation

## 2016-10-24 DIAGNOSIS — I639 Cerebral infarction, unspecified: Secondary | ICD-10-CM

## 2016-10-24 DIAGNOSIS — E118 Type 2 diabetes mellitus with unspecified complications: Secondary | ICD-10-CM | POA: Insufficient documentation

## 2016-10-24 LAB — GLUCOSE, POCT (MANUAL RESULT ENTRY): POC GLUCOSE: 148 mg/dL — AB (ref 70–99)

## 2016-10-24 MED ORDER — TRAZODONE HCL 50 MG PO TABS: 25.0000 mg | ORAL_TABLET | Freq: Every evening | ORAL | 3 refills | Status: DC | PRN

## 2016-10-24 MED FILL — traZODone HCL 50 MG TABS: 50 | 30 days supply | Qty: 30 | Fill #0

## 2016-10-24 NOTE — Patient Instructions (Addendum)
Get records from [place]. Start Trazodone at bedtime as discussed.   Td Vaccine (Tetanus and Diphtheria): What You Need to Know 1. Why get vaccinated? Tetanus  and diphtheria are very serious diseases. They are rare in the [place] today, but people who do become infected often have severe complications. Td vaccine is used to protect adolescents and adults from both of these diseases. Both tetanus and diphtheria are infections caused by bacteria. Diphtheria spreads from person to person through coughing or sneezing. Tetanus-causing bacteria enter the body through cuts, scratches, or wounds. TETANUS (lockjaw) causes painful muscle tightening and stiffness, usually all over the body.  It can lead to tightening of muscles in the head and neck so you can't open your mouth, swallow, or sometimes even breathe. Tetanus kills about 1 out of every 10 people who are infected even after receiving the best medical care. DIPHTHERIA can cause a thick coating to form in the back of the throat.  It can lead to breathing problems, paralysis, heart failure, and death. Before vaccines, as many as 200,000 cases of diphtheria and hundreds of cases of tetanus were reported in the [place] each year. Since vaccination began, reports of cases for both diseases have dropped by about 99%. 2. Td vaccine Td vaccine can protect adolescents and adults from tetanus and diphtheria. Td is usually given as a booster dose every 10 years but it can also be given earlier after a severe and dirty wound or burn. Another vaccine, called Tdap, which protects against pertussis in addition to tetanus and diphtheria, is sometimes recommended instead of Td vaccine. Your doctor or the person giving you the vaccine can give you more information. Td may safely be given at the same time as other vaccines. 3. Some people should not get this vaccine  A person who has ever had a life-threatening allergic reaction after a previous dose  of any tetanus or diphtheria containing vaccine, OR has a severe allergy to any part of this vaccine, should not get Td vaccine. Tell the person giving the vaccine about any severe allergies.  Talk to your doctor if you:  had severe pain or swelling after any vaccine containing diphtheria or tetanus,  ever had a condition called Guillain Barre Syndrome (GBS),  aren't feeling well on the day the shot is scheduled. 4. What are the risks from Td vaccine? With any medicine, including vaccines, there is a chance of side effects. These are usually mild and go away on their own. Serious reactions are also possible but are rare. Most people who get Td vaccine do not have any problems with it. Mild problems following Td vaccine:  (Did not interfere with activities)  Pain where the shot was given (about 8 people in 10)  Redness or swelling where the shot was given (about 1 person in 4)  Mild fever (rare)  Headache (about 1 person in 4)  Tiredness (about 1 person in 4) Moderate problems following Td vaccine:  (Interfered with activities, but did not require medical attention)  Fever over 102F (rare) Severe problems following Td vaccine:  (Unable to perform usual activities; required medical attention)  Swelling, severe pain, bleeding and/or redness in the arm where the shot was given (rare). Problems that could happen after any vaccine:   People sometimes faint after a medical procedure, including vaccination. Sitting or lying down for about 15 minutes can help prevent fainting, and injuries caused by a fall. Tell your doctor if you feel dizzy, or have  vision changes or ringing in the ears.  Some people get severe pain in the shoulder and have difficulty moving the arm where a shot was given. This happens very rarely.  Any medication can cause a severe allergic reaction. Such reactions from a vaccine are very rare, estimated at fewer than 1 in a million doses, and would happen within a  few minutes to a few hours after the vaccination. As with any medicine, there is a very remote chance of a vaccine causing a serious injury or death. The safety of vaccines is always being monitored. For more information, visit: http://www.aguilar.org/ 5. What if there is a serious reaction? What should I look for?  Look for anything that concerns you, such as signs of a severe allergic reaction, very high fever, or unusual behavior. Signs of a severe allergic reaction can include hives, swelling of the face and throat, difficulty breathing, a fast heartbeat, dizziness, and weakness. These would usually start a few minutes to a few hours after the vaccination. What should I do?   If you think it is a severe allergic reaction or other emergency that can't wait, call 9-1-1 or get the person to the nearest hospital. Otherwise, call your doctor.  Afterward, the reaction should be reported to the Vaccine Adverse Event Reporting System (VAERS). Your doctor might file this report, or you can do it yourself through the VAERS web site at www.vaers.SamedayNews.es, or by calling 2601979726.  VAERS does not give medical advice. 6. The National Vaccine Injury Compensation Program The [employer] Vaccine Injury Compensation Program (VICP) is a federal program that was created to compensate people who may have been injured by certain vaccines. Persons who believe they may have been injured by a vaccine can learn about the program and about filing a claim by calling (867)248-9598 or visiting the [place] website at GoldCloset.com.ee. There is a time limit to file a claim for compensation. 7. How can I learn more?  Ask your doctor. He or she can give you the vaccine package insert or suggest other sources of information.  Call your local or state health department.  Contact the Centers for Disease Control and Prevention (CDC):  Call 928-490-1290 (1-800-CDC-INFO)  Visit CDC's website at  http://hunter.com/ CDC Td Vaccine VIS (09/07/15) This information is not intended to replace advice given to you by your health care provider. Make sure you discuss any questions you have with your health care provider. Document Released: 03/12/2006 Document Revised: 02/03/2016 Document Reviewed: 02/03/2016 Elsevier Interactive Patient Education  2017 [employer].

## 2016-10-24 NOTE — Progress Notes (Signed)
Patient ID: Breanna Obrien, female    DOB: 04-Apr-1956  MRN: [id number]  CC: Establish Care and Insomnia   Subjective: Breanna Obrien is a 61 y.o. female who presents to establish care with me as her PCP. Last seen by [facility] 10/09/2016 Her concerns today include:  Hx of recent CVA (10/05/2016, MRI revealed left pontine infarct and old right frontal periventricular white matter infarct), bilateral carotid artery stenosis, HTN, HL, DM 2  1.  CVA:  Had presented with right-sided numbness. Going to P.T once a wk to  improve balance and strength.  Also does exercises at home daily. She works in day care.  Needs letter for her employer letter them know what happened and that she will be released once she completes P.T -having hard time sleeping due to right side feeling tight and mild depression associated with recent CVA.  -on ASA and Plavix -has appt coming up with neurology; no insurance. Plans to meet with financial counselor  2. Carotid artery stenosis -has appt with Vascular surgeon in the near future  3.  DM:  Doing better with eating habits.  More grilled foods -Has cut back on bread and rice -compliant with meds. Checks BS 3-4 x a day.  Reports BS 85-110 in mornings  4.  HTN: compliant with meds and salt restriction  MMG 03/2016.  Due for pap, due for eye exam.  Patient Active Problem List   Diagnosis Date Noted  . Type 2 diabetes mellitus with complication, without long-term current use of insulin ([place]) 10/06/2016  . Essential hypertension 10/06/2016  . Ataxia   . Hyperlipidemia   . Bilateral carotid artery stenosis   . CVA (cerebral vascular accident) ([place]) 10/05/2016     Current Outpatient Prescriptions on File Prior to Visit  Medication Sig Dispense Refill  . amLODipine (NORVASC) 10 MG tablet Take 1 tablet (10 mg total) by mouth daily. 30 tablet 3  . aspirin 325 MG tablet Take 1 tablet (325 mg total) by mouth daily. 30 tablet 2  . atenolol (TENORMIN) 100 MG tablet Take  1 tablet (100 mg total) by mouth every evening. 30 tablet 3  . atorvastatin (LIPITOR) 40 MG tablet Take 1 tablet (40 mg total) by mouth daily at 6 PM. 30 tablet 2  . calcium-vitamin D (OSCAL WITH D) 500-200 MG-UNIT tablet Take 1 tablet by mouth.    . cholecalciferol (VITAMIN D) 400 units TABS tablet Take 400 Units by mouth.    . clopidogrel (PLAVIX) 75 MG tablet Take 1 tablet (75 mg total) by mouth daily. 30 tablet 3  . co-enzyme Q-10 30 MG capsule Take 30 mg by mouth 3 (three) times daily.    . dapagliflozin propanediol (FARXIGA) 10 MG TABS tablet Take 10 mg by mouth daily.    Marland Kitchen glipiZIDE (GLUCOTROL) 10 MG tablet Take 1 tablet (10 mg total) by mouth 2 (two) times daily before a meal. 60 tablet 3  . insulin glargine (LANTUS) 100 UNIT/ML injection Inject 14 Units into the skin at bedtime.    . metFORMIN (GLUCOPHAGE) 1000 MG tablet Take 1 tablet (1,000 mg total) by mouth 2 (two) times daily with a meal. 60 tablet 3  . niacin 500 MG tablet Take 500 mg by mouth at bedtime.    . valsartan-hydrochlorothiazide (DIOVAN-HCT) 320-25 MG tablet Take 1 tablet by mouth daily. 30 tablet 3   No current facility-administered medications on file prior to visit.     No Known Allergies  Social History   Social  History  . Marital status: Married    Spouse name: N/A  . Number of children: N/A  . Years of education: N/A   Occupational History  . Not on file.   Social History Main Topics  . Smoking status: Not on file  . Smokeless tobacco: Never Used  . Alcohol use Not on file  . Drug use: Unknown  . Sexual activity: Not on file   Other Topics Concern  . Not on file   Social History Narrative  . No narrative on file    No family history on file.  Past Surgical History:  Procedure Laterality Date  . CHOLECYSTECTOMY    . CORONARY ANGIOPLASTY WITH STENT PLACEMENT      ROS: Review of Systems  Constitutional: Negative for appetite change and fatigue.  Neurological: Positive for weakness.  Negative for dizziness, tremors and light-headedness.    PHYSICAL EXAM: BP 140/82   Pulse 66   Temp 97.4 F (36.3 C) (Oral)   Resp 16   Wt 190 lb 6.4 oz (86.4 kg)   SpO2 99%   BMI 33.73 kg/m   Physical Exam  General appearance - alert, well appearing, middle age to older female and in no distress Mental status - alert, oriented to person, place, and time, normal mood, behavior, speech, dress, motor activity, and thought processes Mouth - mucous membranes moist, pharynx normal without lesions Neck - supple, no significant adenopathy Chest - clear to auscultation, no wheezes, rales or rhonchi, symmetric air entry Heart - normal rate, regular rhythm, normal S1, S2, no murmurs, rubs, clicks or gallops Neurological - upper extremities: Power RUE proximal and distal 4+/5, LUE 5 out of 5. Lower extremities: Power RLE 4+/5, LLE5/5. Subjective decreased sensation to light touch. In the right upper and lower extremity Extremities - peripheral pulses normal, no pedal edema, no clubbing or cyanosis Diabetic Foot Exam - Simple   Simple Foot Form Visual Inspection No deformities, no ulcerations, no other skin breakdown bilaterally:  Yes Sensation Testing See comments:  Yes Pulse Check Posterior Tibialis and Dorsalis pulse intact bilaterally:  Yes Comments LEEP abnormal on right sole. No ulcers or calluses on the feet.      Depression screen PHQ 2/9 10/24/2016  Decreased Interest 1  Down, Depressed, Hopeless 1  PHQ - 2 Score 2  Altered sleeping 3  Tired, decreased energy 2  Change in appetite 1  Feeling bad or failure about yourself  0  Trouble concentrating 1  Moving slowly or fidgety/restless 0  Suicidal thoughts 0  PHQ-9 Score 9   GAD 7 : Generalized Anxiety Score 10/24/2016 10/09/2016  Nervous, Anxious, on Edge 2 1  Control/stop worrying 1 1  Worry too much - different things 2 1  Trouble relaxing 1 1  Restless 1 0  Easily annoyed or irritable 1 1  Afraid - awful might  happen 1 0  Total GAD 7 Score 9 5    Results for orders placed or performed in visit on 10/24/16  POCT glucose (manual entry)  Result Value Ref Range   POC Glucose 148 (A) 70 - 99 mg/dl    ASSESSMENT AND PLAN: 1. Cerebrovascular accident (CVA), unspecified mechanism ([place]) -Stable. Continue physical therapy. Once she is cleared by therapists she may be able to return to work. Letter given. -Continue aspirin and Plavix 3 months then just Plavix -Keep appointment with neurologist and vascular surgeon. Hopefully she will qualify for the [facility] card so that she can keep those appointments.  2.  Type 2 diabetes mellitus with complication, without long-term current use of insulin (HCC) -At goal. Continue current medications. - POCT glucose (manual entry)  3. Hypertension, unspecified type -At goal continue current medications. DASH diet and encouraged  4. Need for Tdap vaccination - Tdap vaccine greater than or equal to 7yo IM  5. Insomnia due to medical condition -Good sleep hygiene discussed - traZODone (DESYREL) 50 MG tablet; Take 0.5-1 tablets (25-50 mg total) by mouth at bedtime as needed for sleep.  Dispense: 30 tablet; Refill: 3  6. Depression, unspecified depression type - traZODone (DESYREL) 50 MG tablet; Take 0.5-1 tablets (25-50 mg total) by mouth at bedtime as needed for sleep.  Dispense: 30 tablet; Refill: 3   Patient was given the opportunity to ask questions.  Patient verbalized understanding of the plan and was able to repeat key elements of the plan.   Orders Placed This Encounter  Procedures  . Tdap vaccine greater than or equal to 7yo IM  . POCT glucose (manual entry)     Requested Prescriptions   Signed Prescriptions Disp Refills  . traZODone (DESYREL) 50 MG tablet 30 tablet 3    Sig: Take 0.5-1 tablets (25-50 mg total) by mouth at bedtime as needed for sleep.    Return in about 3 months (around 01/24/2017).  Karle Plumber, MD, FACP

## 2016-11-01 ENCOUNTER — Ambulatory Visit: Payer: Self-pay | Attending: Physician Assistant | Admitting: Physical Therapy

## 2016-11-01 ENCOUNTER — Encounter: Payer: Self-pay | Admitting: Physical Therapy

## 2016-11-01 DIAGNOSIS — R2681 Unsteadiness on feet: Secondary | ICD-10-CM | POA: Insufficient documentation

## 2016-11-01 DIAGNOSIS — I69951 Hemiplegia and hemiparesis following unspecified cerebrovascular disease affecting right dominant side: Secondary | ICD-10-CM | POA: Insufficient documentation

## 2016-11-01 DIAGNOSIS — R2689 Other abnormalities of gait and mobility: Secondary | ICD-10-CM | POA: Insufficient documentation

## 2016-11-01 DIAGNOSIS — R208 Other disturbances of skin sensation: Secondary | ICD-10-CM | POA: Insufficient documentation

## 2016-11-01 NOTE — Therapy (Signed)
[facility] [address] [place], [place], [address] Phone: [phone]   Fax:  [phone]  Physical Therapy Treatment  Patient Details  Name: Breanna Obrien MRN: [id number] Date of Birth: 06-Mar-1956 Referring Provider: Zettie Pho, PA  Encounter Date: 11/01/2016      PT End of Session - 11/01/16 1319    Visit Number 3   Number of Visits 9   Date for PT Re-Evaluation 12/08/16   Authorization Type self pay   PT Start Time 0759   PT Stop Time 0845   PT Time Calculation (min) 46 min   Activity Tolerance Patient tolerated treatment well   Behavior During Therapy [facility] for tasks assessed/performed      Past Medical History:  Diagnosis Date  . Diabetes mellitus without complication ([place])   . Hypertension     Past Surgical History:  Procedure Laterality Date  . CHOLECYSTECTOMY    . CORONARY ANGIOPLASTY WITH STENT PLACEMENT      There were no vitals filed for this visit.      Subjective Assessment - 11/01/16 0759    Subjective Reports she sprained her right ankle years ago and recently has been really hurting (sharp pain) when she walks alot and with some PT HEP exercises.    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 No/denies                         [facility] Adult PT Treatment/Exercise - 11/01/16 1304      Ambulation/Gait   Ambulation/Gait Yes   Ambulation/Gait Assistance 6: Modified independent (Device/Increase time)   Ambulation Distance (Feet) 480 Feet   Assistive device None   Gait Pattern Step-through pattern;Decreased weight shift to right;Wide base of support;Decreased step length - left;Decreased stance time - right  as fatigued, landing further on rt lat border rt foot    Ambulation Surface Level;Indoor   Gait Comments patient carrying simulated laundry basket with bil UEs to block ability to see her feet and  balance challenge; no difficulties and reported began to feel slight fatigue in rt ankle     Exercises   Exercises Ankle     Knee/Hip Exercises: Standing   Heel Raises Both;1 set   Hip Abduction Stengthening;Both;1 set   Abduction Limitations pt pulling rt foot into endrange supination when lifting her leg (in attempt to keep leg from rotating outwards) with + pain; educated on holding leg in neutral with foot neutral with no futher pain   Hip Extension Stengthening;Both;1 set   SLS at counter, bil up to 10 sec LLE no UE support, <3 sec on RLE; educated to use minimal UE support to get to 10 seconds on RLE and then try no UE support with ultimate goal 30 sec   Other Standing Knee Exercises Patient vry confused re: this exercise for HEP and spent incr time working on technique and updating wording on handout:forward and backward wt-shift with RLE behind (pt has difficulty placing RLE in back wards walking due to decr sensation)   Other Standing Knee Exercises walking sideways Rt and lt; attempted braiding with incr Rt ankle pain and instructed to not do this exercise currently     Ankle Exercises: Standing   Other Standing Ankle Exercises tandem stance up to 30 sec; rt and lt foot in front     Ankle Exercises: Seated  Other Seated Ankle Exercises eversion with heel down and yellow resistance band around forefoot (anchored on other foot)                PT Education - 11/01/16 1318    Education provided Yes   Education Details Reviewed HEP for correct technique with some clarifications/education required; additions to HEP   Person(s) Educated Patient   Methods Explanation;Demonstration;Tactile cues;Verbal cues;Handout   Comprehension Verbalized understanding;Returned demonstration;Verbal cues required;Tactile cues required;Need further instruction          PT Short Term Goals - 10/13/16 1924      PT SHORT TERM GOAL #1   Title Patient will be independent with basic HEP for  balance and strengthening. (TARGET date for all STGs 11/10/16)   Baseline no HEP; pt has been walking laps inside home   Time 4   Period Weeks   Status New     PT SHORT TERM GOAL #2   Title Patient will improve gait velocity to >1.81 ft/sec (threshold for increased risk of falls).    Time 4   Period Weeks   Status New     PT SHORT TERM GOAL #3   Title Patient will be able to carry toddler-size object weighing ~10 lbs x 120 ft with no LOB.    Time 4   Period Weeks   Status New     PT SHORT TERM GOAL #4   Title Patient will improve TUG normal to <13.5 ft/sec to demonstrate decreased risk of falling.    Time 4   Period Weeks   Status New           PT Long Term Goals - 10/13/16 1932      PT LONG TERM GOAL #1   Title Patient will be independent with updated HEP (Target for all LTGs 12/08/16)   Time 8   Period Weeks   Status New     PT LONG TERM GOAL #2   Title Patient will improve gait velocity to >2.0 ft/sec to demonstrate progress towards ambulating at safe speed for a community ambulator.    Time 8   Period Weeks   Status New     PT LONG TERM GOAL #3   Title Patient will be able to carry toddler-size object weighing 20 lbs x 120 ft with no LOB.   Time 8   Period Months   Status New     PT LONG TERM GOAL #4   Title Patient's TUG cognitive will be within 10% of her TUG normal time demonstrating improved ability to multi-task while walking.   Time 8   Period Weeks   Status New               Plan - 11/01/16 1320    Clinical Impression Statement Skilled session focused on establishing HEP for strengthening and balance. Patient has faithfully been doing her home exercises despite Rt ankle/foot pain. Exercises updated to hopefully eliminate ankle/foot pain. Patient will continue to benefit from PT working towards goals.    Rehab Potential Good   PT Frequency 1x / week  recommended 2x/wk, however pt reports can only afford 1x/wk   PT Duration 8 weeks   PT  Treatment/Interventions ADLs/Self Care Home Management;DME Instruction;Gait training;Stair training;Functional mobility training;Therapeutic activities;Therapeutic exercise;Balance training;Neuromuscular re-education;Cognitive remediation;Patient/family education   PT Next Visit Plan Pt only 1x/wk due to finances; check for ?s re: HEP rt ankle eversion with yellow band, standing hip abdct, ant-post weight shift with staggered  stance (she had most difficulty with these on 6/5 HEP check); practice lifting box, weighted box up onto counter/elevated mat (for simulated toddler up to change diaper; practice carrying simulated toddler as she would do at work    PT Sellers heel raises/toe raises, standing hip abdct, flex, extension, tandem stance, SLS, backward walking and braiding; rt ankle eversion yellow band   Consulted and Agree with Plan of Care Patient      Patient will benefit from skilled therapeutic intervention in order to improve the following deficits and impairments:  Abnormal gait, Decreased balance, Decreased cognition, Decreased knowledge of use of DME, Decreased mobility, Decreased strength, Impaired sensation  Visit Diagnosis: 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 Insomnia due to medical condition 10/24/2016  . Depression 10/24/2016  . Type 2 diabetes mellitus with complication, without long-term current use of insulin ([place]) 10/06/2016  . Essential hypertension 10/06/2016  . Ataxia   . Hyperlipidemia   . Bilateral carotid artery stenosis   . CVA (cerebral vascular accident) ([place]) 10/05/2016    Rexanne Mano, PT 11/01/2016, 1:28 PM  [facility] [address] [place], [place], [address] Phone: [phone]   Fax:  [phone]  Name: Breanna Obrien MRN: [id number] Date of Birth: [birth year]

## 2016-11-01 NOTE — Patient Instructions (Addendum)
Eversion: Resisted    With right foot in tubing loop, hold tubing around other foot to resist and turn foot out. In sitting, think of lifting little toe up and out towards the ceiling.  Repeat __10__ times per set. Do __1__ sets per session. Do __1__ sessions per day.  If tolerating 10 reps, increase to 15 and then 20.    **Begin walking inside home carrying a laundry basket. Gradually increase weight of basket by adding towels, books, etc. Walk until legs are fatigued or when rt ankle just begins to feel sore/weak.

## 2016-11-08 ENCOUNTER — Ambulatory Visit: Payer: Self-pay | Admitting: Physical Therapy

## 2016-11-08 ENCOUNTER — Encounter: Payer: Self-pay | Admitting: Physical Therapy

## 2016-11-08 DIAGNOSIS — R2689 Other abnormalities of gait and mobility: Secondary | ICD-10-CM

## 2016-11-08 DIAGNOSIS — R2681 Unsteadiness on feet: Secondary | ICD-10-CM

## 2016-11-08 DIAGNOSIS — I69951 Hemiplegia and hemiparesis following unspecified cerebrovascular disease affecting right dominant side: Secondary | ICD-10-CM

## 2016-11-08 DIAGNOSIS — R208 Other disturbances of skin sensation: Secondary | ICD-10-CM

## 2016-11-08 NOTE — Therapy (Signed)
[facility] [address] [place], [place], [address] Phone: [phone]   Fax:  [phone]  Physical Therapy Treatment  Patient Details  Name: Breanna Obrien MRN: [id number] Date of Birth: 1955/08/08 Referring Provider: Zettie Pho, PA  Encounter Date: 11/08/2016      PT End of Session - 11/08/16 1300    Visit Number 4   Number of Visits 9   Date for PT Re-Evaluation 12/08/16   Authorization Type self pay   PT Start Time 0800   PT Stop Time 0846   PT Time Calculation (min) 46 min   Activity Tolerance Patient tolerated treatment well   Behavior During Therapy [facility] for tasks assessed/performed      Past Medical History:  Diagnosis Date  . Diabetes mellitus without complication ([place])   . Hypertension     Past Surgical History:  Procedure Laterality Date  . CHOLECYSTECTOMY    . CORONARY ANGIOPLASTY WITH STENT PLACEMENT      There were no vitals filed for this visit.      Subjective Assessment - 11/08/16 0802    Subjective (P)  Still mostly having problems with the tightness she feels along her right side (neck, shoulder, upper arm, axilla, upper thigh). Balance is doing good unless she walks too fast. Feels herself moving to her right side when she loses her balance. Has noticed that when she is sitting her rt foot supinates (and she doesn't feel it). Worked on carrying Biochemist, clinical.    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)                         [facility] Adult PT Treatment/Exercise - 11/08/16 0847      Ambulation/Gait   Ambulation/Gait Yes   Ambulation/Gait Assistance 6: Modified independent (Device/Increase time)   Ambulation Distance (Feet) 480 Feet  300 with 12# pillow on hip (toddler-style carrying)   Assistive device None   Gait Pattern Step-through pattern;Decreased weight shift to right;Wide base of support;Decreased  step length - left;Decreased stance time - right  as fatigued, landing further on rt lat border rt foot    Ambulation Surface Level;Indoor     Self-Care   Self-Care Lifting;Other Self-Care Comments   Lifting plastic crate with 10# from floor to counter height x 3, x 5   Other Self-Care Comments  educated in using de-sensitization techniques on right side where she feels parasthesias                PT Education - 11/08/16 1300    Education provided Yes   Education Details how to perform de-sensitization on RUE, rt side and rt thigh; additions to HEP   Person(s) Educated Patient   Methods Explanation;Demonstration;Verbal cues;Handout   Comprehension Verbalized understanding;Returned demonstration;Verbal cues required;Need further instruction          PT Short Term Goals - 10/13/16 1924      PT SHORT TERM GOAL #1   Title Patient will be independent with basic HEP for balance and strengthening. (TARGET date for all STGs 11/10/16)   Baseline no HEP; pt has been walking laps inside home   Time 4   Period Weeks   Status New     PT SHORT TERM GOAL #2   Title Patient will improve gait velocity to >1.81 ft/sec (threshold for increased risk of falls).    Time 4  Period Weeks   Status New     PT SHORT TERM GOAL #3   Title Patient will be able to carry toddler-size object weighing ~10 lbs x 120 ft with no LOB.    Time 4   Period Weeks   Status New     PT SHORT TERM GOAL #4   Title Patient will improve TUG normal to <13.5 ft/sec to demonstrate decreased risk of falling.    Time 4   Period Weeks   Status New           PT Long Term Goals - 10/13/16 1932      PT LONG TERM GOAL #1   Title Patient will be independent with updated HEP (Target for all LTGs 12/08/16)   Time 8   Period Weeks   Status New     PT LONG TERM GOAL #2   Title Patient will improve gait velocity to >2.0 ft/sec to demonstrate progress towards ambulating at safe speed for a community ambulator.     Time 8   Period Weeks   Status New     PT LONG TERM GOAL #3   Title Patient will be able to carry toddler-size object weighing 20 lbs x 120 ft with no LOB.   Time 8   Period Months   Status New     PT LONG TERM GOAL #4   Title Patient's TUG cognitive will be within 10% of her TUG normal time demonstrating improved ability to multi-task while walking.   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 Patient very receptive to all education and making excellent progress.    Rehab Potential Good   PT Frequency 1x / week  recommended 2x/wk, however pt reports can only afford 1x/wk   PT Duration 8 weeks   PT Treatment/Interventions ADLs/Self Care Home Management;DME Instruction;Gait training;Stair training;Functional mobility training;Therapeutic activities;Therapeutic exercise;Balance training;Neuromuscular re-education;Cognitive remediation;Patient/family education   PT Next Visit Plan Pt only 1x/wk due to finances; check for ?s re: HEP rt ankle eversion with yellow band, standing hip abdct, ant-post weight shift with staggered stance (she had most difficulty with these on 6/5 HEP check); practice lifting box, weighted box up onto counter/elevated mat (for simulated toddler up to change diaper; practice carrying simulated toddler as she would do at work    PT [place] check STGs (send note to MD and employer); simulating work with carrying "toddler" or lifting up onto diaper changing station; dynamic balance/compliant surfaces    Consulted and Agree with Plan of Care Patient      Patient will benefit from skilled therapeutic intervention in order to improve the following deficits and impairments:  Abnormal gait, Decreased balance, Decreased cognition,  Decreased knowledge of use of DME, Decreased mobility, Decreased strength, Impaired sensation  Visit Diagnosis: 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 Insomnia due to medical condition 10/24/2016  . Depression 10/24/2016  . Type 2 diabetes mellitus with complication, without long-term current  use of insulin ([place]) 10/06/2016  . Essential hypertension 10/06/2016  . Ataxia   . Hyperlipidemia   . Bilateral carotid artery stenosis   . CVA (cerebral vascular accident) ([place]) 10/05/2016    Rexanne Mano, PT 11/08/2016, 1:05 PM  [facility] [address] [place], [place], [address] Phone: [phone]   Fax:  [phone]  Name: Breanna Obrien MRN: [id number] Date of Birth: January 11, 1956

## 2016-11-08 NOTE — Patient Instructions (Signed)
Homework:  Place "weights" (books, canned goods, bags of rice) in a bucket or trashcan and practice lifting this up onto counter/changing table height.  Place rice bags in pillow case (or use a large bag of rice if it's the appropriate weight) and practice carrying on your hip like a toddler.   Try different textures to rub against your skin where the sensation is still "tight."

## 2016-11-15 ENCOUNTER — Encounter: Payer: Self-pay | Admitting: Physical Therapy

## 2016-11-15 ENCOUNTER — Ambulatory Visit: Payer: Self-pay | Admitting: Physical Therapy

## 2016-11-15 DIAGNOSIS — I69951 Hemiplegia and hemiparesis following unspecified cerebrovascular disease affecting right dominant side: Secondary | ICD-10-CM

## 2016-11-15 DIAGNOSIS — R2681 Unsteadiness on feet: Secondary | ICD-10-CM

## 2016-11-15 DIAGNOSIS — R208 Other disturbances of skin sensation: Secondary | ICD-10-CM

## 2016-11-15 DIAGNOSIS — R2689 Other abnormalities of gait and mobility: Secondary | ICD-10-CM

## 2016-11-15 NOTE — Patient Instructions (Signed)
  Feet Apart (Compliant Surface) Head Motion - Eyes Open (progress to eyes Closed)    Stand on pillow with feet shoulder width apart. Hold for 30 seconds. move head slowly, right and left x 10; up and down x10 Repeat 1 times per session. Do 1 sessions per day.  Copyright  VHI. All rights reserved.  Feet Together (Compliant Surface) Head Motion - Eyes Closed    Stand on pillow with feet together. Hold for 30 seconds. move head slowly, right and left x 10; up and down x10 Repeat 1 times per session. Do 1 sessions per day.  Copyright  VHI. All rights reserved.  Feet Partial Heel-Toe (Compliant Surface) Head Motion - Eyes Closed    Stand on pillow with one foot slightly ahead of the other.  Hold for 30 seconds. move head slowly, right and left x 10; up and down x10. Repeat with other foot in front. Repeat 1 times per session. Do 1 sessions per day. Copyright  VHI. All rights reserved.

## 2016-11-15 NOTE — Therapy (Signed)
[facility] [address] [place], [place], [address] Phone: [phone]   Fax:  [phone]  Physical Therapy Treatment  Patient Details  Name: Breanna Obrien MRN: [id number] Date of Birth: 1955-12-12 Referring Provider: Zettie Pho, PA  Encounter Date: 11/15/2016      PT End of Session - 11/15/16 1518    Visit Number 5   Number of Visits 9   Date for PT Re-Evaluation 12/08/16   Authorization Type self pay   PT Start Time 0800   PT Stop Time 0850   PT Time Calculation (min) 50 min   Activity Tolerance Patient tolerated treatment well   Behavior During Therapy [facility] for tasks assessed/performed      Past Medical History:  Diagnosis Date  . Diabetes mellitus without complication ([place])   . Hypertension     Past Surgical History:  Procedure Laterality Date  . CHOLECYSTECTOMY    . CORONARY ANGIOPLASTY WITH STENT PLACEMENT      There were no vitals filed for this visit.      Subjective Assessment - 11/15/16 0755    Subjective Reports tight sensation in her UE and rt side is lessening. She has been working on desensitization. Her thigh does not seem to be changing.    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 No/denies                         [facility] Adult PT Treatment/Exercise - 11/15/16 0001      Ambulation/Gait   Ambulation/Gait Assistance 6: Modified independent (Device/Increase time)   Ambulation Distance (Feet) 240 Feet  while carrying toddler size object weighing 10#   Assistive device None   Gait Pattern Step-through pattern;Decreased weight shift to right;Wide base of support;Decreased step length - left;Decreased stance time - right   Ambulation Surface Level   Gait velocity 32.8/9.45= 3.47 ft/sec  32.8/8.81=3.72     Standardized Balance Assessment   Standardized Balance Assessment Timed Up and Go Test     Timed Up and Go Test   TUG Normal TUG   Normal TUG (seconds) 11.22     Knee/Hip Exercises: Machines for Strengthening   Cybex Leg Press 70# x20; x10, x 10; bil     Knee/Hip Exercises: Standing   Hip Flexion Stengthening;Both;1 set;10 reps;Knee straight  green band   Hip Abduction Stengthening;Both;1 set;10 reps  green band   Hip Extension Stengthening;Both;1 set;10 reps;Knee straight  green band             Balance Exercises - 11/15/16 1517      Balance Exercises: Standing   Standing Eyes Opened Narrow base of support (BOS);Wide (BOA);Head turns;Foam/compliant surface           PT Education - 11/15/16 1517    Education provided Yes   Education Details results of testing; additions/updates to HEP   Person(s) Educated Patient   Methods Explanation;Demonstration;Handout   Comprehension Verbalized understanding;Returned demonstration;Need further instruction          PT Short Term Goals - 11/15/16 0810      PT SHORT TERM GOAL #1   Title Patient will be independent with basic HEP for balance and strengthening. (TARGET date for all STGs 11/10/16)   Baseline no HEP; pt has been walking laps inside home;  6/20 met   Time 4   Period Weeks   Status Achieved  PT SHORT TERM GOAL #2   Title Patient will improve gait velocity to >1.81 ft/sec (threshold for increased risk of falls).    Baseline 6/20 3.47 ft/sec   Time 4   Period Weeks   Status Achieved     PT SHORT TERM GOAL #3   Title Patient will be able to carry toddler-size object weighing ~10 lbs x 120 ft with no LOB.    Baseline 6/20 met   Time 4   Period Weeks   Status Achieved     PT SHORT TERM GOAL #4   Title Patient will improve TUG normal to <13.5 ft/sec to demonstrate decreased risk of falling.    Baseline 6/20 11.22   Time 4   Period Weeks   Status Achieved           PT Long Term Goals - 10/13/16 1932      PT LONG TERM GOAL #1   Title Patient will be independent with updated HEP (Target  for all LTGs 12/08/16)   Time 8   Period Weeks   Status New     PT LONG TERM GOAL #2   Title Patient will improve gait velocity to >2.0 ft/sec to demonstrate progress towards ambulating at safe speed for a community ambulator.    Time 8   Period Weeks   Status New     PT LONG TERM GOAL #3   Title Patient will be able to carry toddler-size object weighing 20 lbs x 120 ft with no LOB.   Time 8   Period Months   Status New     PT LONG TERM GOAL #4   Title Patient's TUG cognitive will be within 10% of her TUG normal time demonstrating improved ability to multi-task while walking.   Time 8   Period Weeks   Status New               Plan - 11/15/16 1521    Clinical Impression Statement Skilled session focused on assessment of STGs with pt meeting 4 of 4 goals. Provided updates to her HEP to continue to progress pt towards her LTGs and her goal to return to work.    Rehab Potential Good   PT Frequency 1x / week  recommended 2x/wk, however pt reports can only afford 1x/wk   PT Duration 8 weeks   PT Treatment/Interventions ADLs/Self Care Home Management;DME Instruction;Gait training;Stair training;Functional mobility training;Therapeutic activities;Therapeutic exercise;Balance training;Neuromuscular re-education;Cognitive remediation;Patient/family education   PT Next Visit Plan Pt only 1x/wk due to finances; practice corner ex's (new 6/20), practice lifting 15-20# weighted box up onto counter/elevated mat (for simulated toddler up to change diaper; practice carrying simulated 15-20# toddler as she would do at work    [employer] and Agree with Plan of Care Patient      Patient will benefit from skilled therapeutic intervention in order to improve the following deficits and impairments:  Abnormal gait, Decreased balance, Decreased cognition, Decreased knowledge of use of DME, Decreased mobility, Decreased strength, Impaired sensation  Visit Diagnosis: Hemiplegia and hemiparesis  following unspecified cerebrovascular disease affecting right dominant side (HCC)  Other disturbances of skin sensation  Unsteadiness on feet  Other abnormalities of gait and mobility     Problem List Patient Active Problem List   Diagnosis Date Noted  . Insomnia due to medical condition 10/24/2016  . Depression 10/24/2016  . Type 2 diabetes mellitus with complication, without long-term current use of insulin ([place]) 10/06/2016  . Essential hypertension 10/06/2016  .  Ataxia   . Hyperlipidemia   . Bilateral carotid artery stenosis   . CVA (cerebral vascular accident) ([place]) 10/05/2016    Rexanne Mano, PT 11/15/2016, 3:27 PM  [facility] [address] [place], [place], [address] Phone: [phone]   Fax:  [phone]  Name: Julea Hutto MRN: [id number] Date of Birth: [birth year]

## 2016-11-20 ENCOUNTER — Other Ambulatory Visit: Payer: Self-pay | Admitting: Internal Medicine

## 2016-11-20 ENCOUNTER — Ambulatory Visit: Payer: Self-pay | Admitting: Nurse Practitioner

## 2016-11-20 ENCOUNTER — Encounter: Payer: Self-pay | Admitting: Internal Medicine

## 2016-11-20 MED ORDER — DICLOFENAC SODIUM 1 % TD GEL: 2.0000 g | Freq: Four times a day (QID) | TRANSDERMAL | 1 refills | Status: DC

## 2016-11-20 NOTE — Telephone Encounter (Signed)
Patient concern

## 2016-11-21 MED FILL — DICLOFENAC SODIUM 1% GEL: 1 | 12 days supply | Qty: 100 | Fill #0

## 2016-11-22 ENCOUNTER — Ambulatory Visit: Payer: Self-pay | Admitting: Physical Therapy

## 2016-11-22 ENCOUNTER — Encounter: Payer: Self-pay | Admitting: Physical Therapy

## 2016-11-22 DIAGNOSIS — R2681 Unsteadiness on feet: Secondary | ICD-10-CM

## 2016-11-22 DIAGNOSIS — R208 Other disturbances of skin sensation: Secondary | ICD-10-CM

## 2016-11-22 DIAGNOSIS — I69951 Hemiplegia and hemiparesis following unspecified cerebrovascular disease affecting right dominant side: Secondary | ICD-10-CM

## 2016-11-22 DIAGNOSIS — R2689 Other abnormalities of gait and mobility: Secondary | ICD-10-CM

## 2016-11-22 NOTE — Therapy (Signed)
[facility] [address] [place], [place], [address] Phone: [phone]   Fax:  [phone]  Physical Therapy Treatment  Patient Details  Name: Breanna Obrien MRN: [id number] Date of Birth: April 26, 1956 Referring Provider: Zettie Pho, PA  Encounter Date: 11/22/2016      PT End of Session - 11/22/16 0849    Visit Number 6   Number of Visits 9   Date for PT Re-Evaluation 12/08/16   Authorization Type self pay   PT Start Time 0801   PT Stop Time 0845   PT Time Calculation (min) 44 min   Activity Tolerance Patient tolerated treatment well   Behavior During Therapy [facility] for tasks assessed/performed      Past Medical History:  Diagnosis Date  . Diabetes mellitus without complication ([place])   . Hypertension     Past Surgical History:  Procedure Laterality Date  . CHOLECYSTECTOMY    . CORONARY ANGIOPLASTY WITH STENT PLACEMENT      There were no vitals filed for this visit.      Subjective Assessment - 11/22/16 0804    Subjective Reports at the beginning the day her right side feels almost normal. By the end of the day, areas have that tight feeling.    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 No/denies              [facility] Adult PT Treatment/Exercise - 11/22/16 0001      Ambulation/Gait   Ambulation/Gait Assistance 6: Modified independent (Device/Increase time)   Ambulation Distance (Feet) 60 Feet   Assistive device None   Ambulation Surface Unlevel;Indoor  red and blue mats with bean bags underneath   Gait Comments forward, backwards, sideways (bent knees) and marching across mats                                          [facility] Adult PT Treatment/Exercise - 11/22/16 0001      Therapeutic Activites    Therapeutic Activities Lifting;Work Economist 20# in crate from 14" height to counter x 10 pivoting  to Lt and x 10 pivoting to Rt (simulated lifting toddler for diaper change); walking carrying 20# crate  Stand on blue mat staggered stance, moving cones from floor up to counter and back down; repeated with each leg forward and with each arm     Exercises   Exercises Elbow/Shoulder  bil with 6Lb ball elbow flexion then shoulder flexion/elbow extension to reach overhead; attempted with RUE only and could not fully flex shoulder to reach overhead due to weakness (denied shoulder pain)  Leg press bil 70# x 10 reps (3 sec hold); RLE only 50#                  PT Short Term Goals - 11/15/16 0810      PT SHORT TERM GOAL #1   Title Patient will be independent with basic HEP for balance and strengthening. (TARGET date for all STGs 11/10/16)   Baseline no HEP; pt has been walking laps inside home;  6/20 met   Time 4   Period Weeks   Status Achieved     PT SHORT TERM GOAL #2   Title Patient will improve gait velocity to >1.81 ft/sec (threshold for increased risk of falls).  Baseline 6/20 3.47 ft/sec   Time 4   Period Weeks   Status Achieved     PT SHORT TERM GOAL #3   Title Patient will be able to carry toddler-size object weighing ~10 lbs x 120 ft with no LOB.    Baseline 6/20 met   Time 4   Period Weeks   Status Achieved     PT SHORT TERM GOAL #4   Title Patient will improve TUG normal to <13.5 ft/sec to demonstrate decreased risk of falling.    Baseline 6/20 11.22   Time 4   Period Weeks   Status Achieved           PT Long Term Goals - 10/13/16 1932      PT LONG TERM GOAL #1   Title Patient will be independent with updated HEP (Target for all LTGs 12/08/16)   Time 8   Period Weeks   Status New     PT LONG TERM GOAL #2   Title Patient will improve gait velocity to >2.0 ft/sec to demonstrate progress towards ambulating at safe speed for a community ambulator.    Time 8   Period Weeks   Status New     PT LONG TERM GOAL #3   Title Patient will be able to  carry toddler-size object weighing 20 lbs x 120 ft with no LOB.   Time 8   Period Months   Status New     PT LONG TERM GOAL #4   Title Patient's TUG cognitive will be within 10% of her TUG normal time demonstrating improved ability to multi-task while walking.   Time 8   Period Weeks   Status New               Plan - 11/22/16 0850    Clinical Impression Statement Session focused on work simulation, balance training, and strength training. Patient fatigued during session, however did well to push through and complete all reps. Discussed her next MD appointment is not until August and she is wondering about returning to work part-time. Instructed pt to call physician and see if she can be seen sooner. Will continue to work towards [place].   Rehab Potential Good   PT Frequency 1x / week  recommended 2x/wk, however pt reports can only afford 1x/wk   PT Duration 8 weeks   PT Treatment/Interventions ADLs/Self Care Home Management;DME Instruction;Gait training;Stair training;Functional mobility training;Therapeutic activities;Therapeutic exercise;Balance training;Neuromuscular re-education;Cognitive remediation;Patient/family education   PT Next Visit Plan Pt only 1x/wk due to finances; practice corner ex's (new 6/20), practice lifting 20# weighted box up from low blue bench onto counter (for simulated toddler up to change diaper; practice carrying simulated 20# toddler as she would do at work; compliant surfaces, leg press   Consulted and Agree with Plan of Care Patient      Patient will benefit from skilled therapeutic intervention in order to improve the following deficits and impairments:  Abnormal gait, Decreased balance, Decreased cognition, Decreased knowledge of use of DME, Decreased mobility, Decreased strength, Impaired sensation  Visit Diagnosis: 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 Insomnia due to medical condition 10/24/2016  . Depression 10/24/2016  . Type 2 diabetes mellitus with complication, without long-term current use of insulin ([place]) 10/06/2016  .  Essential hypertension 10/06/2016  . Ataxia   . Hyperlipidemia   . Bilateral carotid artery stenosis   . CVA (cerebral vascular accident) ([place]) 10/05/2016    Rexanne Mano, PT 11/22/2016, 12:11 PM  [facility] [address] [place], [place], [address] Phone: [phone]   Fax:  [phone]  Name: Breanna Obrien MRN: [id number] Date of Birth: 08-17-1955

## 2016-11-23 ENCOUNTER — Encounter: Payer: Self-pay | Admitting: Internal Medicine

## 2016-11-23 NOTE — Telephone Encounter (Signed)
Patient concern

## 2016-11-24 ENCOUNTER — Telehealth: Payer: Self-pay

## 2016-11-24 ENCOUNTER — Telehealth: Payer: Self-pay | Admitting: Internal Medicine

## 2016-11-24 NOTE — Telephone Encounter (Signed)
I received a my chart message from patient requesting to be released to return to work part time 3 days a week working 4 hours. Patient stated that she spoke with her physical therapist and the therapist felt it was okay for her to do that. I contacted the physical therapist Barry Brunner and discussed with her patient's desire to return to work. Based on her assessment she felt it was okay for the patient to return to work part-time 3 days a week for 4 hours with lifting restrictions of no more than 20 pounds. I will write a letter for patient.

## 2016-11-24 NOTE — Telephone Encounter (Signed)
Contacted pt to inform her that her letter is ready for pick up pt is aware and states she will pick it up on monday

## 2016-11-30 ENCOUNTER — Ambulatory Visit: Payer: Self-pay | Attending: Physician Assistant | Admitting: Physical Therapy

## 2016-11-30 ENCOUNTER — Encounter: Payer: Self-pay | Admitting: Physical Therapy

## 2016-11-30 VITALS — BP 129/77 | HR 68

## 2016-11-30 DIAGNOSIS — I69951 Hemiplegia and hemiparesis following unspecified cerebrovascular disease affecting right dominant side: Secondary | ICD-10-CM | POA: Insufficient documentation

## 2016-11-30 DIAGNOSIS — R2689 Other abnormalities of gait and mobility: Secondary | ICD-10-CM | POA: Insufficient documentation

## 2016-11-30 DIAGNOSIS — R2681 Unsteadiness on feet: Secondary | ICD-10-CM | POA: Insufficient documentation

## 2016-11-30 NOTE — Therapy (Signed)
[facility] [address] [place], [place], [address] Phone: [phone]   Fax:  [phone]  Physical Therapy Treatment  Patient Details  Name: Breanna Obrien MRN: [id number] Date of Birth: November 19, 1955 Referring Provider: Karle Plumber, MD; Zettie Pho PA  Encounter Date: 11/30/2016      PT End of Session - 11/30/16 0852    Visit Number 7   Number of Visits 9   Date for PT Re-Evaluation 12/08/16   Authorization Type self pay   PT Start Time 0800   PT Stop Time 0840   PT Time Calculation (min) 40 min   Activity Tolerance Patient tolerated treatment well   Behavior During Therapy [facility] for tasks assessed/performed      Past Medical History:  Diagnosis Date  . Diabetes mellitus without complication ([place])   . Hypertension     Past Surgical History:  Procedure Laterality Date  . CHOLECYSTECTOMY    . CORONARY ANGIOPLASTY WITH STENT PLACEMENT      Vitals:   11/30/16 0827 11/30/16 0833  BP: (!) 163/86  (after 25 minutes simulated work activities) 129/77  Pulse: 75 68        Subjective Assessment - 11/30/16 0753    Subjective Is going in to work for 4 hours today! Will be doing 3 days/week for 4 hrs per day, 20# lifting restriction. Able to hold her grandaughter (23# but very low tone and with full body spasms). Will be floating to other classrooms.    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 No/denies            [facility] PT Assessment - 11/30/16 0001      Assessment   Referring Provider Karle Plumber, MD; Zettie Pho PA                     [facility] Adult PT Treatment/Exercise - 11/30/16 0001      Ambulation/Gait   Ambulation/Gait Assistance 7: Independent   Ambulation Distance (Feet) 1000 Feet   Assistive device None   Gait Pattern Step-through pattern   Ambulation Surface Unlevel;Outdoor;Paved   Gait Comments  simulated pulling wagon of children for work  (handle/rope attached to w/c weighted down with 80#)      Posture/Postural Control   Posture Comments excellent body mechanics during simulated lifting of toddler to changing table height     Exercises   Exercises Other Exercises   Other Exercises  lifting small crate (20#) from 18" chair to countertop x 5 from lt and x 5 from rt (simulated lifting toddler to changing table)     Knee/Hip Exercises: Aerobic   Elliptical L1 1.5 minutes forward, 1 min backward                  PT Short Term Goals - 11/15/16 0810      PT SHORT TERM GOAL #1   Title Patient will be independent with basic HEP for balance and strengthening. (TARGET date for all STGs 11/10/16)   Baseline no HEP; pt has been walking laps inside home;  6/20 met   Time 4   Period Weeks   Status Achieved     PT SHORT TERM GOAL #2   Title Patient will improve gait velocity to >1.81 ft/sec (threshold for increased risk of falls).    Baseline 6/20 3.47 ft/sec   Time 4   Period Weeks  Status Achieved     PT SHORT TERM GOAL #3   Title Patient will be able to carry toddler-size object weighing ~10 lbs x 120 ft with no LOB.    Baseline 6/20 met   Time 4   Period Weeks   Status Achieved     PT SHORT TERM GOAL #4   Title Patient will improve TUG normal to <13.5 ft/sec to demonstrate decreased risk of falling.    Baseline 6/20 11.22   Time 4   Period Weeks   Status Achieved           PT Long Term Goals - 10/13/16 1932      PT LONG TERM GOAL #1   Title Patient will be independent with updated HEP (Target for all LTGs 12/08/16)   Time 8   Period Weeks   Status New     PT LONG TERM GOAL #2   Title Patient will improve gait velocity to >2.0 ft/sec to demonstrate progress towards ambulating at safe speed for a community ambulator.    Time 8   Period Weeks   Status New     PT LONG TERM GOAL #3   Title Patient will be able to carry toddler-size object weighing  20 lbs x 120 ft with no LOB.   Time 8   Period Months   Status New     PT LONG TERM GOAL #4   Title Patient's TUG cognitive will be within 10% of her TUG normal time demonstrating improved ability to multi-task while walking.   Time 8   Period Weeks   Status New               Plan - 11/30/16 3235    Clinical Impression Statement Patient tolerated 25 minutes of work simulation (lifting, pulling, outdoor walking on unlevel surfaces) without a rest break. Her BP was slighted elevated immediately after exercise, however after 5 minutes of rest, it had returned to acceptable range (see vitals) and was able to continue work simulation. Discussed pt's plan to return to work part-time and will complete final/discharge visit next week after she has worked several days.    Rehab Potential Good   PT Frequency 1x / week  recommended 2x/wk, however pt reports can only afford 1x/wk   PT Duration 8 weeks   PT Treatment/Interventions ADLs/Self Care Home Management;DME Instruction;Gait training;Stair training;Functional mobility training;Therapeutic activities;Therapeutic exercise;Balance training;Neuromuscular re-education;Cognitive remediation;Patient/family education   PT Next Visit Plan check LTGs, discharge   Consulted and Agree with Plan of Care Patient      Patient will benefit from skilled therapeutic intervention in order to improve the following deficits and impairments:  Abnormal gait, Decreased balance, Decreased cognition, Decreased knowledge of use of DME, Decreased mobility, Decreased strength, Impaired sensation  Visit Diagnosis: Unsteadiness on feet  Other abnormalities of gait and mobility     Problem List Patient Active Problem List   Diagnosis Date Noted  . Insomnia due to medical condition 10/24/2016  . Depression 10/24/2016  . Type 2 diabetes mellitus with complication, without long-term current use of insulin ([place]) 10/06/2016  . Essential hypertension 10/06/2016   . Ataxia   . Hyperlipidemia   . Bilateral carotid artery stenosis   . CVA (cerebral vascular accident) ([place]) 10/05/2016    Rexanne Mano, PT 11/30/2016, 8:58 AM  [facility] [address] [place], [place], [address] Phone: [phone]   Fax:  [phone]  Name: Breanna Obrien MRN: [id number] Date  of Birth: [birth year]

## 2016-11-30 NOTE — Patient Instructions (Signed)
Put weighted objects in your laundry basket, attach a rope (to simulate a wagon), and pull across the carpet.

## 2016-12-06 ENCOUNTER — Ambulatory Visit: Payer: Self-pay | Admitting: Physical Therapy

## 2016-12-06 ENCOUNTER — Encounter: Payer: Self-pay | Admitting: Physical Therapy

## 2016-12-06 DIAGNOSIS — I69951 Hemiplegia and hemiparesis following unspecified cerebrovascular disease affecting right dominant side: Secondary | ICD-10-CM

## 2016-12-06 NOTE — Therapy (Signed)
[facility] [address] [place], [place], [address] Phone: [phone]   Fax:  [phone]  Physical Therapy Treatment and Discharge  Patient Details  Name: Breanna Obrien MRN: [id number] Date of Birth: Nov 22, 1955 Referring Provider: Karle Plumber, MD; Zettie Pho PA  Encounter Date: 12/06/2016      PT End of Session - 12/06/16 0831    Visit Number 8   Number of Visits 9   Date for PT Re-Evaluation 12/08/16   Authorization Type self pay   PT Start Time 0802   PT Stop Time 0846   PT Time Calculation (min) 44 min   Activity Tolerance Patient tolerated treatment well   Behavior During Therapy [facility] for tasks assessed/performed      Past Medical History:  Diagnosis Date  . Diabetes mellitus without complication ([place])   . Hypertension     Past Surgical History:  Procedure Laterality Date  . CHOLECYSTECTOMY    . CORONARY ANGIOPLASTY WITH STENT PLACEMENT      There were no vitals filed for this visit.      Subjective Assessment - 12/06/16 0757    Subjective Did well with first day of work x 2 hrs. 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 Wants to try lifting 30# today.    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 No/denies                         [facility] Adult PT Treatment/Exercise - 12/06/16 0001      Transfers   Sit to Stand Without upper extremity assist;From bed;7: Independent   Stand to Sit 7: Independent     Ambulation/Gait   Ambulation/Gait Assistance 7: Independent   Ambulation Distance (Feet) 200 Feet   Assistive device None   Gait Pattern Within Functional Limits   Ambulation Surface Level;Indoor   Gait velocity 32.8/9.75=3.36 ft/sec     Timed Up and Go Test   TUG Normal TUG;Cognitive TUG   Normal TUG (seconds) 9.97 sec    Cognitive TUG (seconds) 11.15 sec     Therapeutic Activites    Therapeutic Activities Lifting;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 to counter x 5 from her left x 5 from her right     Knee/Hip Exercises: Aerobic   Nustep L5 x 6 minutes                PT Education - 12/06/16 0947    Education provided Yes   Education Details results of testing; discussed [facility] and available classes/amenities; discussed greenways and provided link to parks and rec as pt/husband want to ride bikes again but didn't know a safe place   Person(s) Educated Patient;Spouse   Methods Explanation;Handout          PT Short Term Goals - 11/15/16 0810      PT SHORT TERM GOAL #1   Title Patient will be independent with basic HEP for balance and strengthening. (TARGET date for all STGs 11/10/16)   Baseline no HEP; pt has been walking laps inside home;  6/20 met   Time 4   Period Weeks   Status Achieved     PT SHORT TERM GOAL #2   Title Patient will improve gait velocity to >1.81 ft/sec (threshold for increased risk of falls).  Baseline 6/20 3.47 ft/sec   Time 4   Period Weeks   Status Achieved     PT SHORT TERM GOAL #3   Title Patient will be able to carry toddler-size object weighing ~10 lbs x 120 ft with no LOB.    Baseline 6/20 met   Time 4   Period Weeks   Status Achieved     PT SHORT TERM GOAL #4   Title Patient will improve TUG normal to <13.5 ft/sec to demonstrate decreased risk of falling.    Baseline 6/20 11.22   Time 4   Period Weeks   Status Achieved           PT Long Term Goals - 12/06/16 7517      PT LONG TERM GOAL #1   Title Patient will be independent with updated HEP (Target for all LTGs 12/08/16)   Time 8   Period Weeks   Status Achieved     PT LONG TERM GOAL #2   Title Patient will improve gait velocity to >2.0 ft/sec to demonstrate progress towards ambulating at safe speed for a community ambulator.    Baseline 7/11 3.36 ft/sec   Time 8   Period Weeks    Status Achieved     PT LONG TERM GOAL #3   Title Patient will be able to carry toddler-size object weighing 20 lbs x 120 ft with no LOB.   Baseline 7/11 carried 30#   Time 8   Period Months   Status Achieved     PT LONG TERM GOAL #4   Title Patient's TUG cognitive will be within 10% of her TUG normal time demonstrating improved ability to multi-task while walking.   Baseline 7/11 TUG 9.97, TUG cognitive 11.15 (12% higher than TUG normal)   Time 8   Period Weeks   Status Partially Met               Plan - 12/06/16 0949    Clinical Impression Statement Patient met 3 of 4 LTGs with 4th goal partially met (had improved but not to goal level). Patient demonstrated ability to lift 30# repeatedly in simulated "diaper station" in preparation for caring for larger toddlers. She feels confident with her discharge from PT with continued part-time hours and continuing her exercises on her own.    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 Insomnia due to medical condition 10/24/2016  . Depression 10/24/2016  . Type 2 diabetes mellitus with complication, without long-term current use of insulin ([place]) 10/06/2016  . Essential hypertension 10/06/2016  . Ataxia   . Hyperlipidemia   . Bilateral carotid artery stenosis   . CVA (cerebral vascular accident) ([place]) 10/05/2016   PHYSICAL THERAPY DISCHARGE SUMMARY  Visits from Start of Care: 8  Current functional level related to goals / functional outcomes: See above   Remaining deficits: Sensory changes on rt side   Education / Equipment: HEP  Plan: Patient agrees  to discharge.  Patient goals were partially met. Patient is being discharged due to being pleased with the current functional level.  ?????  Rexanne Mano, PT 12/06/2016, 9:53 AM  [facility] [address] [place], [place], [address] Phone: [phone]   Fax:  [phone]  Name: Breanna Obrien MRN: [id number] Date of Birth: Nov 02, 1955

## 2016-12-11 MED FILL — traZODone HCL 50 MG TABS: 50 | 30 days supply | Qty: 30 | Fill #1

## 2016-12-13 ENCOUNTER — Encounter: Payer: Self-pay | Admitting: Internal Medicine

## 2016-12-13 NOTE — Progress Notes (Signed)
I received records from [facility] Dr. Marthe Patch. 1. Pt last seen there 05/2015. Blood test 10/31/2014 revealed mild elevation of AST/ALT of 43 and 69. Pt had negative hep B and C screening tests. 2. Has mild microalbuminuria -78 3. Has LT calcaneal spur 4. NL bone density study 03/25/2016 5. MMG 03/25/2016 with group Ca+ lateral aspect of both breast.  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  A 6 mm sebaceous cyst is present at 5:00 position. Conclusion: mass in LT breast probably benign. BiRADS 3. F/u diagnostic MMG in 6 mths recommended.  RT Breast US:  An oval 7 mm mass at the 9:00 position, 10 cm from nipple matches MMG finding. 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

## 2016-12-14 NOTE — Telephone Encounter (Signed)
Patient response

## 2016-12-15 ENCOUNTER — Encounter: Payer: Self-pay | Admitting: Internal Medicine

## 2016-12-15 NOTE — Progress Notes (Signed)
Pt requested updated letter for work with restrictions of 30 lbs.  I have reviewed therapist note and will write letter.

## 2017-01-05 ENCOUNTER — Encounter: Payer: Self-pay | Admitting: Internal Medicine

## 2017-01-05 NOTE — Progress Notes (Signed)
Received additional records from patient's previous primary physician Dr. Albertine Grates. Notes from 02/08/19 17 states that patient has coronary artery disease. On the surgical history he made mention of stent placement 2. Will clarify with the patient on next visit

## 2017-01-15 ENCOUNTER — Ambulatory Visit: Payer: Self-pay | Attending: Internal Medicine | Admitting: Internal Medicine

## 2017-01-15 ENCOUNTER — Encounter: Payer: Self-pay | Admitting: Internal Medicine

## 2017-01-15 VITALS — BP 138/79 | HR 68 | Temp 98.4°F | Resp 16 | Wt 183.8 lb

## 2017-01-15 DIAGNOSIS — Z79899 Other long term (current) drug therapy: Secondary | ICD-10-CM | POA: Insufficient documentation

## 2017-01-15 DIAGNOSIS — R928 Other abnormal and inconclusive findings on diagnostic imaging of breast: Secondary | ICD-10-CM | POA: Insufficient documentation

## 2017-01-15 DIAGNOSIS — I10 Essential (primary) hypertension: Secondary | ICD-10-CM | POA: Insufficient documentation

## 2017-01-15 DIAGNOSIS — I6523 Occlusion and stenosis of bilateral carotid arteries: Secondary | ICD-10-CM | POA: Insufficient documentation

## 2017-01-15 DIAGNOSIS — G4700 Insomnia, unspecified: Secondary | ICD-10-CM

## 2017-01-15 DIAGNOSIS — Z124 Encounter for screening for malignant neoplasm of cervix: Secondary | ICD-10-CM | POA: Insufficient documentation

## 2017-01-15 DIAGNOSIS — I2510 Atherosclerotic heart disease of native coronary artery without angina pectoris: Secondary | ICD-10-CM | POA: Insufficient documentation

## 2017-01-15 DIAGNOSIS — I639 Cerebral infarction, unspecified: Secondary | ICD-10-CM

## 2017-01-15 DIAGNOSIS — E118 Type 2 diabetes mellitus with unspecified complications: Secondary | ICD-10-CM | POA: Insufficient documentation

## 2017-01-15 DIAGNOSIS — Z955 Presence of coronary angioplasty implant and graft: Secondary | ICD-10-CM | POA: Insufficient documentation

## 2017-01-15 DIAGNOSIS — E785 Hyperlipidemia, unspecified: Secondary | ICD-10-CM | POA: Insufficient documentation

## 2017-01-15 DIAGNOSIS — G4701 Insomnia due to medical condition: Secondary | ICD-10-CM | POA: Insufficient documentation

## 2017-01-15 DIAGNOSIS — Z7902 Long term (current) use of antithrombotics/antiplatelets: Secondary | ICD-10-CM | POA: Insufficient documentation

## 2017-01-15 DIAGNOSIS — Z794 Long term (current) use of insulin: Secondary | ICD-10-CM | POA: Insufficient documentation

## 2017-01-15 DIAGNOSIS — Z1211 Encounter for screening for malignant neoplasm of colon: Secondary | ICD-10-CM | POA: Insufficient documentation

## 2017-01-15 DIAGNOSIS — Z8673 Personal history of transient ischemic attack (TIA), and cerebral infarction without residual deficits: Secondary | ICD-10-CM | POA: Insufficient documentation

## 2017-01-15 DIAGNOSIS — F329 Major depressive disorder, single episode, unspecified: Secondary | ICD-10-CM | POA: Insufficient documentation

## 2017-01-15 DIAGNOSIS — Z8669 Personal history of other diseases of the nervous system and sense organs: Secondary | ICD-10-CM

## 2017-01-15 LAB — POCT GLYCOSYLATED HEMOGLOBIN (HGB A1C): HEMOGLOBIN A1C: 6.5

## 2017-01-15 LAB — GLUCOSE, POCT (MANUAL RESULT ENTRY): POC Glucose: 148 mg/dl — AB (ref 70–99)

## 2017-01-15 MED ORDER — ATORVASTATIN CALCIUM 40 MG PO TABS: 40.0000 mg | ORAL_TABLET | Freq: Every day | ORAL | 6 refills | Status: DC

## 2017-01-15 NOTE — Patient Instructions (Addendum)
Give forms for [facility] card/[facility] discount.  Please call Jonna Clark  [phone], with the BCCCP (breast and cervical cancer control program) to schedule diagnostic mammogram   STOP ASPIRIN.  GET mammogram done  Pneumococcal Polysaccharide Vaccine: What You Need to Know 1. Why get vaccinated? Vaccination can protect older adults (and some children and younger adults) from pneumococcal disease. Pneumococcal disease is caused by bacteria that can spread from person to person through close contact. It can cause ear infections, and it can also lead to more serious infections of the:  Lungs (pneumonia),  Blood (bacteremia), and  Covering of the brain and spinal cord (meningitis). Meningitis can cause deafness and brain damage, and it can be fatal.  Anyone can get pneumococcal disease, but children under 59 years of age, people with certain medical conditions, adults over 103 years of age, and cigarette smokers are at the highest risk. About 18,000 older adults die each year from pneumococcal disease in the [place]. Treatment of pneumococcal infections with penicillin and other drugs used to be more effective. But some strains of the disease have become resistant to these drugs. This makes prevention of the disease, through vaccination, even more important. 2. Pneumococcal polysaccharide vaccine (PPSV23) Pneumococcal polysaccharide vaccine (PPSV23) protects against 23 types of pneumococcal bacteria. It will not prevent all pneumococcal disease. PPSV23 is recommended for:  All adults 20 years of age and older,  Anyone 2 through 61 years of age with certain long-term health problems,  Anyone 2 through 61 years of age with a weakened immune system,  Adults 31 through 61 years of age who smoke cigarettes or have asthma.  Most people need only one dose of PPSV. A second dose is recommended for certain high-risk groups. People 44 and older should get a dose even if they have gotten one or  more doses of the vaccine before they turned 65. Your healthcare provider can give you more information about these recommendations. Most healthy adults develop protection within 2 to 3 weeks of getting the shot. 3. Some people should not get this vaccine  Anyone who has had a life-threatening allergic reaction to PPSV should not get another dose.  Anyone who has a severe allergy to any component of PPSV should not receive it. Tell your provider if you have any severe allergies.  Anyone who is moderately or severely ill when the shot is scheduled may be asked to wait until they recover before getting the vaccine. Someone with a mild illness can usually be vaccinated.  Children less than 74 years of age should not receive this vaccine.  There is no evidence that PPSV is harmful to either a pregnant woman or to her fetus. However, as a precaution, women who need the vaccine should be vaccinated before becoming pregnant, if possible. 4. Risks of a vaccine reaction With any medicine, including vaccines, there is a chance of side effects. These are usually mild and go away on their own, but serious reactions are also possible. About half of people who get PPSV have mild side effects, such as redness or pain where the shot is given, which go away within about two days. Less than 1 out of 100 people develop a fever, muscle aches, or more severe local reactions. Problems that could happen after any vaccine:  People sometimes faint after a medical procedure, including vaccination. Sitting or lying down for about 15 minutes can help prevent fainting, and injuries caused by a fall. Tell your doctor if you feel dizzy,  or have vision changes or ringing in the ears.  Some people get severe pain in the shoulder and have difficulty moving the arm where a shot was given. This happens very rarely.  Any medication can cause a severe allergic reaction. Such reactions from a vaccine are very rare, estimated at  about 1 in a million doses, and would happen within a few minutes to a few hours after the vaccination. As with any medicine, there is a very remote chance of a vaccine causing a serious injury or death. The safety of vaccines is always being monitored. For more information, visit: http://www.aguilar.org/ 5. What if there is a serious reaction? What should I look for? Look for anything that concerns you, such as signs of a severe allergic reaction, very high fever, or unusual behavior. Signs of a severe allergic reaction can include hives, swelling of the face and throat, difficulty breathing, a fast heartbeat, dizziness, and weakness. These would usually start a few minutes to a few hours after the vaccination. What should I do? If you think it is a severe allergic reaction or other emergency that can't wait, call 9-1-1 or get to the nearest hospital. Otherwise, call your doctor. Afterward, the reaction should be reported to the Vaccine Adverse Event Reporting System (VAERS). Your doctor might file this report, or you can do it yourself through the VAERS web site at www.vaers.SamedayNews.es, or by calling 531-367-7853. VAERS does not give medical advice. 6. How can I learn more?  Ask your doctor. He or she can give you the vaccine package insert or suggest other sources of information.  Call your local or state health department.  Contact the Centers for Disease Control and Prevention (CDC): ? Call 641-558-5032 (1-800-CDC-INFO) or ? Visit CDC's website at http://hunter.com/ CDC Pneumococcal Polysaccharide Vaccine VIS (09/19/13) This information is not intended to replace advice given to you by your health care provider. Make sure you discuss any questions you have with your health care provider. Document Released: 03/12/2006 Document Revised: 02/03/2016 Document Reviewed: 02/03/2016 Elsevier Interactive Patient Education  2017 [employer].

## 2017-01-15 NOTE — Progress Notes (Addendum)
Patient ID: Breanna Obrien, female    DOB: [birth year]  MRN: [id number]  CC: No chief complaint on file.   Subjective: Breanna Obrien is a 61 y.o. female who presents for 3 mth f/u. Last seen 10/24/2016.  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  1.Abnormal MMG Based on old records from [place], pt had MMG in 02/2016. 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  A 6 mm sebaceous cyst is present at 5:00 position. Conclusion: mass in LT breast probably benign. BiRADS 3. F/u diagnostic MMG in 6 mths recommended. RT Breast US:  An oval 7 mm mass at the 9:00 position, 10 cm from nipple matches MMG finding. 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 CVA/CAS:  -still on ASA and Plavix. Plan was to d/c ASA after 3 mths -completed P.T. 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  Would like to increase to 20 hrs a wk. Would like to stay at 30 lbs wgh limit. Doing more lat home ike walking and cleaning around the house.  -still has tight feeling in RUE and RLE since CVA -ran out of Lipitor -did you see neurology or vas surgery for f/u on CAS as no insurance.   3.  DM:   Compliant with meds.  - Metformin, Lantus, Farxiga, and Glucotrol Checks BS - 3-4 x a day. A.m range 100-135, before dinner 85-100. B/w breakfast and dinner 140-200. Due for eye exam. No changes in vision.   4.  HTN -cut back on amount coffee she drinks in a.m -compliant with  Atenolol, Norvasc and Diovan/HCTZ -limits salt No chest pains/shortness of breath/lower extremity edema  5. Depression/[place]:  Please with Trazodone.  Takes 1/2 at bedtime  6.  CAD: confirms hx of  CAD. Stented x 2 in 2008.  Does not recall which arteries. -no CP/SOB.  Patient Active Problem List   Diagnosis Date Noted  . Insomnia due to medical condition 10/24/2016  . Depression 10/24/2016  . Type 2 diabetes mellitus with complication, without long-term current use of insulin ([place]) 10/06/2016  . Essential hypertension 10/06/2016  . Ataxia   . Hyperlipidemia   . Bilateral carotid artery stenosis   . CVA (cerebral vascular accident) ([place]) 10/05/2016     Current Outpatient Prescriptions on File Prior to Visit  Medication Sig Dispense Refill  . amLODipine (NORVASC) 10 MG tablet Take 1 tablet (10 mg total) by mouth daily. 30 tablet 3  . atenolol (TENORMIN) 100 MG tablet Take 1 tablet (100 mg total) by mouth every evening. 30 tablet 3  . calcium-vitamin D (OSCAL WITH D) 500-200 MG-UNIT tablet Take 1 tablet by mouth.    . cholecalciferol (VITAMIN D) 400 units TABS tablet Take 400 Units by mouth.    . clopidogrel (PLAVIX) 75 MG tablet Take 1 tablet (75 mg total) by mouth daily. 30 tablet 3  . co-enzyme Q-10 30 MG capsule Take 30 mg by mouth 3 (three) times daily.    . dapagliflozin propanediol (FARXIGA) 10 MG TABS tablet Take 10 mg by mouth daily.    . diclofenac sodium (VOLTAREN) 1 % GEL Apply 2 g topically  4 (four) times daily. 100 g 1  . glipiZIDE (GLUCOTROL) 10 MG tablet Take 1 tablet (10 mg total) by mouth 2 (two) times daily before a meal. 60 tablet 3  . insulin glargine (LANTUS) 100 UNIT/ML injection Inject 14 Units into the skin at bedtime.    . metFORMIN (GLUCOPHAGE) 1000 MG tablet Take 1 tablet (1,000 mg total) by mouth 2 (two) times daily with a meal. 60 tablet 3  . niacin 500 MG tablet Take 500 mg by mouth at bedtime.    . traZODone (DESYREL) 50 MG tablet Take 0.5-1 tablets (25-50 mg total) by mouth at bedtime as needed for sleep. 30 tablet 3  . valsartan-hydrochlorothiazide (DIOVAN-HCT) 320-25 MG tablet Take 1 tablet by mouth daily. 30 tablet 3   No current  facility-administered medications on file prior to visit.     No Known Allergies  Social History   Social History  . Marital status: Married    Spouse name: N/A  . Number of children: N/A  . Years of education: N/A   Occupational History  . Not on file.   Social History Main Topics  . Smoking status: Never Smoker  . Smokeless tobacco: Never Used  . Alcohol use No  . Drug use: No  . Sexual activity: Not on file   Other Topics Concern  . Not on file   Social History Narrative  . No narrative on file    No family history on file.  Past Surgical History:  Procedure Laterality Date  . CHOLECYSTECTOMY    . CORONARY ANGIOPLASTY WITH STENT PLACEMENT      ROS: Review of Systems Neg except as above  PHYSICAL EXAM: BP 138/79 (BP Location: Left Arm, Patient Position: Sitting, Cuff Size: Large)   Pulse 68   Temp 98.4 F (36.9 C) (Oral)   Resp 16   Wt 183 lb 12.8 oz (83.4 kg)   SpO2 98%   BMI 32.56 kg/m   Physical Exam  General appearance - alert, well appearing, and in no distress Mental status - alert, oriented to person, place, and time, normal mood, behavior, speech, dress, motor activity, and thought processes Eye: lazy eye on LT which pt attributes to hx of Bell's Palsy Mouth - mucous membranes moist, pharynx normal without lesions Neck - supple, no significant adenopathy Chest - clear to auscultation, no wheezes, rales or rhonchi, symmetric air entry Heart - normal rate, regular rhythm, normal S1, S2, no murmurs, rubs, clicks or gallops Extremities - peripheral pulses normal, no pedal edema, no clubbing or cyanosis Breast: No abnormal appearance to the breasts. No palpable masses. No nipple discharge. No axillary lymphadenopathy Pelvic: Orlan Leavens present - No external vaginal lesions. Cervix appears grossly normal. No cervical motion tenderness and no adnexal masses.. Large skin tag left upper inner thigh.  Large cysts in her thigh close to left  buttock Results for orders placed or performed in visit on 01/15/17  Glucose (CBG)  Result Value Ref Range   POC Glucose 148 (A) 70 - 99 mg/dl  HgB A1c  Result Value Ref Range   Hemoglobin A1C 6.5      ASSESSMENT AND PLAN: 1. Type 2 diabetes mellitus with complication, without long-term current use of insulin (HCC) Controlled. Patient to continue healthy eating habits, regular exercise and current medications Due for eye exam. Since she does not have insurance patient advised of cheapest places to have done to include [facility] and [facility]. - Glucose (CBG) - HgB A1c  2. Abnormal mammogram -  MM Digital Diagnostic Unilat L; Future  3. Hypertension, unspecified type Controlled. Continue current medications  4. Coronary artery disease involving native coronary artery of native heart without angina pectoris -Patient with history of unstable angina with subsequent stent to 2 coronary arteries. She has been  medically stable on beta blocker, statin, ARB and APA. Consider changing atenolol to metoprolol in the future  5. Insomnia, unspecified type -Improved on trazodone  6. Cerebrovascular accident (CVA), unspecified mechanism ([place]) -Doing well physically. Note given for work allowing her to increase hours DC aspirin. Continue Plavix - atorvastatin (LIPITOR) 40 MG tablet; Take 1 tablet (40 mg total) by mouth daily at 6 PM.  Dispense: 30 tablet; Refill: 6  7. Bilateral carotid artery stenosis -Follow up with vascular surgeon once she has insurance. Patient encouraged to apply for [facility] discount  8. Pap smear for cervical cancer screening - Cytology - PAP  9. Colon cancer screening - Fecal occult blood, imunochemical   Patient was given the opportunity to ask questions.  Patient verbalized understanding of the plan and was able to repeat key elements of the plan.   Orders Placed This Encounter  Procedures  . Fecal occult blood, imunochemical  . MM Digital Diagnostic Unilat L   . Glucose (CBG)  . HgB A1c     Requested Prescriptions   Signed Prescriptions Disp Refills  . atorvastatin (LIPITOR) 40 MG tablet 30 tablet 6    Sig: Take 1 tablet (40 mg total) by mouth daily at 6 PM.    Return in about 4 months (around 05/17/2017).  Karle Plumber, MD, FACP

## 2017-01-17 LAB — CYTOLOGY - PAP
Diagnosis: NEGATIVE
HPV: NOT DETECTED

## 2017-01-22 ENCOUNTER — Ambulatory Visit: Payer: Self-pay | Admitting: Internal Medicine

## 2017-01-30 ENCOUNTER — Encounter: Payer: Self-pay | Admitting: Internal Medicine

## 2017-01-30 ENCOUNTER — Other Ambulatory Visit: Payer: Self-pay | Admitting: Internal Medicine

## 2017-01-30 MED ORDER — ATENOLOL 100 MG PO TABS: 100.0000 mg | ORAL_TABLET | Freq: Every evening | ORAL | 11 refills | Status: DC

## 2017-01-30 MED ORDER — CLOPIDOGREL BISULFATE 75 MG PO TABS: 75.0000 mg | ORAL_TABLET | Freq: Every day | ORAL | 11 refills | Status: DC

## 2017-01-30 MED ORDER — AMLODIPINE BESYLATE 10 MG PO TABS: 10.0000 mg | ORAL_TABLET | Freq: Every day | ORAL | 11 refills | Status: DC

## 2017-01-30 MED ORDER — GLIPIZIDE 10 MG PO TABS: 10.0000 mg | ORAL_TABLET | Freq: Two times a day (BID) | ORAL | 11 refills | Status: DC

## 2017-01-30 MED ORDER — VALSARTAN-HYDROCHLOROTHIAZIDE 320-25 MG PO TABS: 1.0000 | ORAL_TABLET | Freq: Every day | ORAL | 11 refills | Status: DC

## 2017-01-30 NOTE — Telephone Encounter (Signed)
Patient concern

## 2017-02-05 MED FILL — VOLTAREN 1% GEL: 1 | 12 days supply | Qty: 100 | Fill #1

## 2017-02-05 MED FILL — traZODone HCL 50 MG TABS: 50 | 30 days supply | Qty: 30 | Fill #2

## 2017-02-13 ENCOUNTER — Other Ambulatory Visit: Payer: Self-pay | Admitting: Physician Assistant

## 2017-03-06 ENCOUNTER — Other Ambulatory Visit: Payer: Self-pay | Admitting: Pharmacist

## 2017-03-06 MED ORDER — DAPAGLIFLOZIN PROPANEDIOL 10 MG PO TABS: 10.0000 mg | ORAL_TABLET | Freq: Every day | ORAL | 2 refills | Status: DC

## 2017-03-16 ENCOUNTER — Ambulatory Visit: Payer: Self-pay | Attending: Internal Medicine

## 2017-04-10 ENCOUNTER — Encounter: Payer: Self-pay | Admitting: Internal Medicine

## 2017-04-11 ENCOUNTER — Encounter: Payer: Self-pay | Admitting: Internal Medicine

## 2017-04-11 NOTE — Progress Notes (Signed)
Received request from patient through my chart for a letter for work.  She would like to increase her working hours to 25 hours a week.  Letter written.

## 2017-04-11 NOTE — Progress Notes (Signed)
Pt is aware that letter will be ready by the end of this week

## 2017-05-01 ENCOUNTER — Other Ambulatory Visit: Payer: Self-pay | Admitting: Internal Medicine

## 2017-05-17 ENCOUNTER — Ambulatory Visit: Payer: Self-pay | Attending: Internal Medicine | Admitting: Internal Medicine

## 2017-05-17 ENCOUNTER — Encounter: Payer: Self-pay | Admitting: Internal Medicine

## 2017-05-17 VITALS — BP 128/78 | HR 67 | Temp 98.0°F | Resp 16 | Wt 186.4 lb

## 2017-05-17 DIAGNOSIS — Z794 Long term (current) use of insulin: Secondary | ICD-10-CM | POA: Insufficient documentation

## 2017-05-17 DIAGNOSIS — R928 Other abnormal and inconclusive findings on diagnostic imaging of breast: Secondary | ICD-10-CM | POA: Insufficient documentation

## 2017-05-17 DIAGNOSIS — Z79899 Other long term (current) drug therapy: Secondary | ICD-10-CM | POA: Insufficient documentation

## 2017-05-17 DIAGNOSIS — Z8673 Personal history of transient ischemic attack (TIA), and cerebral infarction without residual deficits: Secondary | ICD-10-CM | POA: Insufficient documentation

## 2017-05-17 DIAGNOSIS — I6523 Occlusion and stenosis of bilateral carotid arteries: Secondary | ICD-10-CM | POA: Insufficient documentation

## 2017-05-17 DIAGNOSIS — I2510 Atherosclerotic heart disease of native coronary artery without angina pectoris: Secondary | ICD-10-CM | POA: Insufficient documentation

## 2017-05-17 DIAGNOSIS — E118 Type 2 diabetes mellitus with unspecified complications: Secondary | ICD-10-CM

## 2017-05-17 DIAGNOSIS — E119 Type 2 diabetes mellitus without complications: Secondary | ICD-10-CM | POA: Insufficient documentation

## 2017-05-17 DIAGNOSIS — Z7902 Long term (current) use of antithrombotics/antiplatelets: Secondary | ICD-10-CM | POA: Insufficient documentation

## 2017-05-17 DIAGNOSIS — F329 Major depressive disorder, single episode, unspecified: Secondary | ICD-10-CM | POA: Insufficient documentation

## 2017-05-17 DIAGNOSIS — E785 Hyperlipidemia, unspecified: Secondary | ICD-10-CM | POA: Insufficient documentation

## 2017-05-17 DIAGNOSIS — I10 Essential (primary) hypertension: Secondary | ICD-10-CM | POA: Insufficient documentation

## 2017-05-17 DIAGNOSIS — R7989 Other specified abnormal findings of blood chemistry: Secondary | ICD-10-CM

## 2017-05-17 DIAGNOSIS — Z1211 Encounter for screening for malignant neoplasm of colon: Secondary | ICD-10-CM

## 2017-05-17 DIAGNOSIS — Z955 Presence of coronary angioplasty implant and graft: Secondary | ICD-10-CM | POA: Insufficient documentation

## 2017-05-17 DIAGNOSIS — R945 Abnormal results of liver function studies: Secondary | ICD-10-CM

## 2017-05-17 LAB — GLUCOSE, POCT (MANUAL RESULT ENTRY): POC Glucose: 187 mg/dl — AB (ref 70–99)

## 2017-05-17 LAB — POCT GLYCOSYLATED HEMOGLOBIN (HGB A1C): HEMOGLOBIN A1C: 6.7

## 2017-05-17 MED ORDER — DAPAGLIFLOZIN PROPANEDIOL 10 MG PO TABS: 10.0000 mg | ORAL_TABLET | Freq: Every day | ORAL | 6 refills | Status: DC

## 2017-05-17 MED ORDER — INSULIN GLARGINE 100 UNIT/ML ~~LOC~~ SOLN: 14.0000 [IU] | Freq: Every day | SUBCUTANEOUS | 6 refills | Status: DC

## 2017-05-17 NOTE — Progress Notes (Signed)
Patient ID: Breanna Obrien, female    DOB: May 10, 1956  MRN: [id number]  CC: Diabetes and Hypertension   Subjective: Breanna Obrien is a 61 y.o. female who presents for chronic ds management. 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  1. Hx of CVA: doing well with inc hrs at work. 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  2.  She has Medicaid but only for [facility].  She can now have MMG done for hx of abnormal MMG. Please refer to my previous note for details on hx of abnormal MMG  3. DM: Needs RF on Farxiga and Lantus Checks BS 2-3 x a day. A.m range 90-110.  Doing okay with eating habits except over thanksgiving Exercise: active on her job. Just started walking around her house with goal of eventually out to a tract near her house.  4. CAD/HTN/HL: no CP/SOB. Compliant with meds  HM: still has FIT test. She has not used it yet.  Patient Active Problem List   Diagnosis Date Noted  . Abnormal mammogram 01/15/2017  . Coronary artery disease involving native coronary artery of native heart without angina pectoris 01/15/2017  . Hx of Bell's palsy 01/15/2017  . Insomnia due to medical condition 10/24/2016  . Depression 10/24/2016  . Type 2 diabetes mellitus with complication, without long-term current use of insulin ([place]) 10/06/2016  . Essential hypertension 10/06/2016  . Ataxia   . Hyperlipidemia   . Bilateral carotid artery stenosis   . CVA (cerebral vascular accident) ([place]) 10/05/2016     Current Outpatient Medications on File Prior to Visit  Medication Sig Dispense Refill  . amLODipine (NORVASC) 10 MG tablet Take 1 tablet (10 mg total) by mouth daily. 30 tablet 11  . atenolol (TENORMIN) 100 MG tablet Take 1 tablet (100 mg total) by mouth every evening. 30 tablet 11  . atorvastatin (LIPITOR) 40 MG  tablet Take 1 tablet (40 mg total) by mouth daily at 6 PM. 30 tablet 6  . calcium-vitamin D (OSCAL WITH D) 500-200 MG-UNIT tablet Take 1 tablet by mouth.    . cholecalciferol (VITAMIN D) 400 units TABS tablet Take 400 Units by mouth.    . clopidogrel (PLAVIX) 75 MG tablet Take 1 tablet (75 mg total) by mouth daily. 30 tablet 11  . co-enzyme Q-10 30 MG capsule Take 30 mg by mouth 3 (three) times daily.    . diclofenac sodium (VOLTAREN) 1 % GEL Apply 2 g topically 4 (four) times daily. 100 g 1  . glipiZIDE (GLUCOTROL) 10 MG tablet Take 1 tablet (10 mg total) by mouth 2 (two) times daily before a meal. 60 tablet 11  . metFORMIN (GLUCOPHAGE) 1000 MG tablet TAKE ONE TABLET BY MOUTH TWICE A DAY WITH A MEAL 30 tablet 0  . niacin 500 MG tablet Take 500 mg by mouth at bedtime.    . traZODone (DESYREL) 50 MG tablet Take 0.5-1 tablets (25-50 mg total) by mouth at bedtime as needed for sleep. 30 tablet 3  . valsartan-hydrochlorothiazide (DIOVAN-HCT) 320-25 MG tablet Take 1 tablet by mouth daily. 30 tablet 11   No current facility-administered medications on file prior to visit.     No Known Allergies  Social History   Socioeconomic History  . Marital status: Married    Spouse name: Not  on file  . Number of children: Not on file  . Years of education: Not on file  . Highest education level: Not on file  Social Needs  . Financial resource strain: Not on file  . Food insecurity - worry: Not on file  . Food insecurity - inability: Not on file  . Transportation needs - medical: Not on file  . Transportation needs - non-medical: Not on file  Occupational History  . Not on file  Tobacco Use  . Smoking status: Never Smoker  . Smokeless tobacco: Never Used  Substance and Sexual Activity  . Alcohol use: No  . Drug use: No  . Sexual activity: Not on file  Other Topics Concern  . Not on file  Social History Narrative  . Not on file    No family history on file.  Past Surgical History:    Procedure Laterality Date  . CHOLECYSTECTOMY    . CORONARY ANGIOPLASTY WITH STENT PLACEMENT      ROS: Review of Systems  HENT:       Had pain in ears for 1 day last wk. No dec hearing.    PHYSICAL EXAM: BP (!) 142/87   Pulse 67   Temp 98 F (36.7 C) (Oral)   Resp 16   Wt 186 lb 6.4 oz (84.6 kg)   SpO2 98%   BMI 33.02 kg/m   Physical Exam Repeat BP 138/78 General appearance - alert, well appearing, and in no distress Mental status - alert, oriented to person, place, and time Ears - RT TM and external ear canal normal.  Moderate hard wax on LT side Neck - supple, no significant adenopathy Chest - clear to auscultation, no wheezes, rales or rhonchi, symmetric air entry Heart - normal rate, regular rhythm, normal S1, S2, no murmurs, rubs, clicks or gallops Extremities - peripheral pulses normal, no pedal edema, no clubbing or cyanosis     Chemistry      Component Value Date/Time   NA 137 10/07/2016 0317   K 3.7 10/07/2016 0317   CL 101 10/07/2016 0317   CO2 26 10/07/2016 0317   BUN 8 10/07/2016 0317   CREATININE 0.80 10/07/2016 0317      Component Value Date/Time   CALCIUM 9.5 10/07/2016 0317   ALKPHOS 71 10/07/2016 0317   AST 42 (H) 10/07/2016 0317   ALT 67 (H) 10/07/2016 0317   BILITOT 0.7 10/07/2016 0317     Lab Results  Component Value Date   WBC 6.4 10/07/2016   HGB 12.9 10/07/2016   HCT 38.2 10/07/2016   MCV 92.7 10/07/2016   PLT 276 10/07/2016     ASSESSMENT AND PLAN: 1. Controlled type 2 diabetes mellitus with complication, with long-term current use of insulin (HCC) Controlled. Continue healthy eating and current meds - POCT glucose (manual entry) - POCT glycosylated hemoglobin (Hb A1C) - Comprehensive metabolic panel - insulin glargine (LANTUS) 100 UNIT/ML injection; Inject 0.14 mLs (14 Units total) into the skin at bedtime.  Dispense: 10 mL; Refill: 6 - dapagliflozin propanediol (FARXIGA) 10 MG TABS tablet; Take 10 mg by mouth daily.   Dispense: 30 tablet; Refill: 6  2. Hypertension, unspecified type -at goal  3. Abnormal mammogram -now that she has Medicaid, pt will call and schedule her MMG appt. I informed her that it is very important that she follows through on this  4. Coronary artery disease involving native coronary artery of native heart without angina pectoris -stable. Cont statin, Plavix, B-blocker and ARB  5. Colon cancer screening Advise her to use and mail in the Cologuard test  6. Abnormal LFTs Repeat LFTs   Patient was given the opportunity to ask questions.  Patient verbalized understanding of the plan and was able to repeat key elements of the plan.   Orders Placed This Encounter  Procedures  . Comprehensive metabolic panel  . POCT glucose (manual entry)  . POCT glycosylated hemoglobin (Hb A1C)     Requested Prescriptions   Signed Prescriptions Disp Refills  . insulin glargine (LANTUS) 100 UNIT/ML injection 10 mL 6    Sig: Inject 0.14 mLs (14 Units total) into the skin at bedtime.  . dapagliflozin propanediol (FARXIGA) 10 MG TABS tablet 30 tablet 6    Sig: Take 10 mg by mouth daily.    Return in about 3 months (around 08/15/2017).  Karle Plumber, MD, FACP

## 2017-05-17 NOTE — Patient Instructions (Signed)
Please remember to do the FIT test (Cologuard) and mail in for colon cancer screening. Please call and schedule your mammogram now that she has Medicaid. You should have your primary provider changed to [place] on your Medicaid card.

## 2017-05-18 ENCOUNTER — Telehealth: Payer: Self-pay | Admitting: Pharmacist

## 2017-05-18 ENCOUNTER — Other Ambulatory Visit: Payer: Self-pay | Admitting: Pharmacist

## 2017-05-18 LAB — COMPREHENSIVE METABOLIC PANEL
A/G RATIO: 1.5 (ref 1.2–2.2)
ALT: 36 IU/L — ABNORMAL HIGH (ref 0–32)
AST: 27 IU/L (ref 0–40)
Albumin: 4.5 g/dL (ref 3.6–4.8)
Alkaline Phosphatase: 97 IU/L (ref 39–117)
BILIRUBIN TOTAL: 0.3 mg/dL (ref 0.0–1.2)
BUN/Creatinine Ratio: 18 (ref 12–28)
BUN: 18 mg/dL (ref 8–27)
CALCIUM: 9.8 mg/dL (ref 8.7–10.3)
CHLORIDE: 100 mmol/L (ref 96–106)
CO2: 23 mmol/L (ref 20–29)
Creatinine, Ser: 1 mg/dL (ref 0.57–1.00)
GFR, EST AFRICAN AMERICAN: 70 mL/min/{1.73_m2} (ref >59)
GFR, EST NON AFRICAN AMERICAN: 61 mL/min/{1.73_m2} (ref >59)
GLOBULIN, TOTAL: 3 g/dL (ref 1.5–4.5)
Glucose: 175 mg/dL — ABNORMAL HIGH (ref 65–99)
POTASSIUM: 4.4 mmol/L (ref 3.5–5.2)
SODIUM: 140 mmol/L (ref 134–144)
TOTAL PROTEIN: 7.5 g/dL (ref 6.0–8.5)

## 2017-05-18 MED ORDER — INSULIN DETEMIR 100 UNIT/ML ~~LOC~~ SOLN: 14.0000 [IU] | Freq: Every day | SUBCUTANEOUS | 6 refills | Status: DC

## 2017-05-18 MED ORDER — BASAGLAR KWIKPEN 100 UNIT/ML ~~LOC~~ SOPN: 14.0000 [IU] | PEN_INJECTOR | Freq: Every day | SUBCUTANEOUS | 2 refills | Status: DC

## 2017-05-18 NOTE — Telephone Encounter (Signed)
Prior authorization again is needed. Lantus is not covered, Nancee Liter is preferred but they will not cover the pen so the other preferred is Levemir. I am wondering if this is the right insurance. What the pharmacy is sending me is something from [place].  I checked the [facility] Medicaid database and patient does not have active coverage. Tried to call patient but unable to reach her. Left VM requesting that she call me back.  Called pharmacy and she does not have Medicaid on file, she has [place] out of [place]. Levemir is preferred. Pharmacist did a test claim and Levemir did go through. Discussed with Dr. Wynetta Emery - Will send Levemir 14 units to pharmacy.

## 2017-05-21 ENCOUNTER — Encounter: Payer: Self-pay | Admitting: Internal Medicine

## 2017-05-25 MED ORDER — "INSULIN SYRINGE 30G X 1/2"" 0.5 ML MISC": 6 refills | Status: DC

## 2017-05-25 NOTE — Telephone Encounter (Signed)
Patient mychart response

## 2017-05-30 ENCOUNTER — Other Ambulatory Visit: Payer: Self-pay | Admitting: Internal Medicine

## 2017-06-28 MED FILL — VALSARTAN-HCTZ 320-25 MG TA: 320-25 | 30 days supply | Qty: 30 | Fill #0

## 2017-06-28 MED FILL — ?ATORVASTATIN 40MG TAB: 40 | 30 days supply | Qty: 30 | Fill #0

## 2017-06-28 MED FILL — ?CLOPIDOGREL 75MG TABLET: 75 | 30 days supply | Qty: 30 | Fill #0

## 2017-06-28 MED FILL — ?GLIPIZIDE 10 MG TABLET: 10 | 30 days supply | Qty: 60 | Fill #0

## 2017-06-28 MED FILL — AMLODIPINE BESYLATE 10 MG T: 10 | 30 days supply | Qty: 30 | Fill #0

## 2017-06-28 MED FILL — ?METFORMIN HCL 1,000 MG TAB: 1000 | 30 days supply | Qty: 60 | Fill #0

## 2017-07-02 MED FILL — ?ATENOLOL 100MG TABLET: 100 | 30 days supply | Qty: 30 | Fill #0

## 2017-07-05 MED FILL — !FARXIGA 10MG TABLET: 10 | 30 days supply | Qty: 30 | Fill #0

## 2017-07-05 MED FILL — !LEVEMIR 100 UNITS/ML VIAL: 100/ML | 28 days supply | Qty: 10 | Fill #0

## 2017-07-09 ENCOUNTER — Ambulatory Visit: Payer: Self-pay | Attending: Internal Medicine

## 2017-07-17 ENCOUNTER — Ambulatory Visit: Payer: Medicaid Other | Attending: Internal Medicine

## 2017-08-01 MED FILL — ?METFORMIN HCL 1,000 MG TAB: 1000 | 30 days supply | Qty: 60 | Fill #1

## 2017-08-01 MED FILL — ?ATORVASTATIN 40MG TABLET: 40 | 30 days supply | Qty: 30 | Fill #1

## 2017-08-01 MED FILL — ?ATENOLOL 100MG TABLET: 100 | 30 days supply | Qty: 30 | Fill #1

## 2017-08-01 MED FILL — FARXIGA 10 MG TABLET: 10 | 30 days supply | Qty: 30 | Fill #1

## 2017-08-01 MED FILL — VALSARTAN-HCTZ 320-25 MG TA: 320-25 | 30 days supply | Qty: 30 | Fill #1

## 2017-08-01 MED FILL — ?GLIPIZIDE 10 MG TABLET: 10 | 30 days supply | Qty: 60 | Fill #1

## 2017-08-01 MED FILL — CLOPIDOGREL 75 MG TABLET: 75 | 30 days supply | Qty: 30 | Fill #1

## 2017-08-01 MED FILL — AMLODIPINE BESYLATE 10 MG T: 10 | 30 days supply | Qty: 30 | Fill #1

## 2017-08-17 ENCOUNTER — Ambulatory Visit: Payer: Self-pay | Attending: Internal Medicine | Admitting: Internal Medicine

## 2017-08-17 ENCOUNTER — Other Ambulatory Visit: Payer: Self-pay | Admitting: Pharmacist

## 2017-08-17 ENCOUNTER — Encounter: Payer: Self-pay | Admitting: Internal Medicine

## 2017-08-17 VITALS — BP 110/60 | HR 73 | Temp 98.1°F | Resp 12 | Wt 181.4 lb

## 2017-08-17 DIAGNOSIS — I2510 Atherosclerotic heart disease of native coronary artery without angina pectoris: Secondary | ICD-10-CM

## 2017-08-17 DIAGNOSIS — Z7902 Long term (current) use of antithrombotics/antiplatelets: Secondary | ICD-10-CM | POA: Insufficient documentation

## 2017-08-17 DIAGNOSIS — Z794 Long term (current) use of insulin: Secondary | ICD-10-CM

## 2017-08-17 DIAGNOSIS — Z8673 Personal history of transient ischemic attack (TIA), and cerebral infarction without residual deficits: Secondary | ICD-10-CM | POA: Insufficient documentation

## 2017-08-17 DIAGNOSIS — Z1211 Encounter for screening for malignant neoplasm of colon: Secondary | ICD-10-CM

## 2017-08-17 DIAGNOSIS — Z955 Presence of coronary angioplasty implant and graft: Secondary | ICD-10-CM | POA: Insufficient documentation

## 2017-08-17 DIAGNOSIS — I10 Essential (primary) hypertension: Secondary | ICD-10-CM

## 2017-08-17 DIAGNOSIS — Z23 Encounter for immunization: Secondary | ICD-10-CM

## 2017-08-17 DIAGNOSIS — E118 Type 2 diabetes mellitus with unspecified complications: Secondary | ICD-10-CM

## 2017-08-17 DIAGNOSIS — F329 Major depressive disorder, single episode, unspecified: Secondary | ICD-10-CM | POA: Insufficient documentation

## 2017-08-17 DIAGNOSIS — E785 Hyperlipidemia, unspecified: Secondary | ICD-10-CM | POA: Insufficient documentation

## 2017-08-17 DIAGNOSIS — I6523 Occlusion and stenosis of bilateral carotid arteries: Secondary | ICD-10-CM | POA: Insufficient documentation

## 2017-08-17 DIAGNOSIS — Z79899 Other long term (current) drug therapy: Secondary | ICD-10-CM | POA: Insufficient documentation

## 2017-08-17 LAB — POCT GLYCOSYLATED HEMOGLOBIN (HGB A1C): HEMOGLOBIN A1C: 6.9

## 2017-08-17 LAB — POCT CBG (FASTING - GLUCOSE)-MANUAL ENTRY: GLUCOSE FASTING, POC: 120 mg/dL — AB (ref 70–99)

## 2017-08-17 MED ORDER — PNEUMOCOCCAL VAC POLYVALENT 25 MCG/0.5ML IJ INJ: INJECTION | INTRAMUSCULAR | 0 refills | Status: DC

## 2017-08-17 MED FILL — $PNEUMOVAX 23 VIAL: 25 | 1 days supply | Qty: 1 | Fill #0

## 2017-08-17 NOTE — Progress Notes (Signed)
Patient ID: Breanna Obrien, female    DOB: Jul 11, 1955  MRN: [id number]  CC: Follow-up; Diabetes; and Hypertension   Subjective: Breanna Obrien is a 62 y.o. female who presents for chronic ds managementt Her concerns today include:  62 year old female with history of left pontine CVA(09/2016),HTN, HL, diabetes type 2, BL carotid artery stenosis, and hx of CAD with stent x 2 per pt's old medical records.  1.  Was stressed a few mths ago having to move out of the house they were renting.  Now settled into an apartment.  2.  Abnormal MMG:  Did not have MMG done that was ordered on last visit. She has the [facility] discount now.  Needs info to schedule MMG -Sent in the FIT test about 1 mth.  We did not get results.  3.  CAD/HTN:  No CP/SOB/LE.  Compliant with meds.  Checks BP 1-2 x a day.  Reports good range.  This a.m was 125/76  4.  DM: checks BS 2-3 x a day.  A.m range 100-130. Evening rang 100-150.  In the 180s after meal Meds: Getting Levemir instead of Lantus.  At 14 units.  Compliant with other oral meds Exercise: started walking this wk. Plan to do it 3 x a wk.   Patient Active Problem List   Diagnosis Date Noted  . Abnormal mammogram 01/15/2017  . Coronary artery disease involving native coronary artery of native heart without angina pectoris 01/15/2017  . Hx of Bell's palsy 01/15/2017  . Insomnia due to medical condition 10/24/2016  . Depression 10/24/2016  . Type 2 diabetes mellitus with complication, without long-term current use of insulin ([place]) 10/06/2016  . Essential hypertension 10/06/2016  . Ataxia   . Hyperlipidemia   . Bilateral carotid artery stenosis   . CVA (cerebral vascular accident) ([place]) 10/05/2016     Current Outpatient Medications on File Prior to Visit  Medication Sig Dispense Refill  . amLODipine (NORVASC) 10 MG tablet Take 1 tablet (10 mg total) by mouth daily. 30 tablet 11  . atenolol (TENORMIN) 100 MG tablet Take 1 tablet (100 mg total) by mouth  every evening. 30 tablet 11  . atorvastatin (LIPITOR) 40 MG tablet Take 1 tablet (40 mg total) by mouth daily at 6 PM. 30 tablet 6  . calcium-vitamin D (OSCAL WITH D) 500-200 MG-UNIT tablet Take 1 tablet by mouth.    . cholecalciferol (VITAMIN D) 400 units TABS tablet Take 400 Units by mouth.    . clopidogrel (PLAVIX) 75 MG tablet Take 1 tablet (75 mg total) by mouth daily. 30 tablet 11  . co-enzyme Q-10 30 MG capsule Take 30 mg by mouth 3 (three) times daily.    . dapagliflozin propanediol (FARXIGA) 10 MG TABS tablet Take 10 mg by mouth daily. 30 tablet 6  . glipiZIDE (GLUCOTROL) 10 MG tablet Take 1 tablet (10 mg total) by mouth 2 (two) times daily before a meal. 60 tablet 11  . insulin detemir (LEVEMIR) 100 UNIT/ML injection Inject 0.14 mLs (14 Units total) into the skin at bedtime. 10 mL 6  . metFORMIN (GLUCOPHAGE) 1000 MG tablet Take 1 tablet (1,000 mg total) by mouth 2 (two) times daily with a meal. 60 tablet 2  . niacin 500 MG tablet Take 500 mg by mouth at bedtime.    . valsartan-hydrochlorothiazide (DIOVAN-HCT) 320-25 MG tablet Take 1 tablet by mouth daily. 30 tablet 11  . Insulin Glargine (BASAGLAR KWIKPEN) 100 UNIT/ML SOPN Inject 0.14 mLs (14 Units total) into the  skin at bedtime. (Patient not taking: Reported on 08/17/2017) 15 mL 2  . Insulin Syringe-Needle U-100 (INSULIN SYRINGE .5CC/30GX1/2") 30G X 1/2" 0.5 ML MISC Use as directed 100 each 6  . traZODone (DESYREL) 50 MG tablet Take 0.5-1 tablets (25-50 mg total) by mouth at bedtime as needed for sleep. (Patient not taking: Reported on 08/17/2017) 30 tablet 3   No current facility-administered medications on file prior to visit.     No Known Allergies  Social History   Socioeconomic History  . Marital status: Married    Spouse name: Not on file  . Number of children: Not on file  . Years of education: Not on file  . Highest education level: Not on file  Occupational History  . Not on file  Social Needs  . Financial resource  strain: Not on file  . Food insecurity:    Worry: Not on file    Inability: Not on file  . Transportation needs:    Medical: Not on file    Non-medical: Not on file  Tobacco Use  . Smoking status: Never Smoker  . Smokeless tobacco: Never Used  Substance and Sexual Activity  . Alcohol use: No  . Drug use: No  . Sexual activity: Not on file  Lifestyle  . Physical activity:    Days per week: Not on file    Minutes per session: Not on file  . Stress: Not on file  Relationships  . Social connections:    Talks on phone: Not on file    Gets together: Not on file    Attends religious service: Not on file    Active member of club or organization: Not on file    Attends meetings of clubs or organizations: Not on file    Relationship status: Not on file  . Intimate partner violence:    Fear of current or ex partner: Not on file    Emotionally abused: Not on file    Physically abused: Not on file    Forced sexual activity: Not on file  Other Topics Concern  . Not on file  Social History Narrative  . Not on file    No family history on file.  Past Surgical History:  Procedure Laterality Date  . CHOLECYSTECTOMY    . CORONARY ANGIOPLASTY WITH STENT PLACEMENT      ROS: Review of Systems -ENT: some sinus congestion a few wks ago.  Using Flonase PHYSICAL EXAM: BP 110/60   Pulse 73   Temp 98.1 F (36.7 C) (Oral)   Resp 12   Wt 181 lb 6.4 oz (82.3 kg)   SpO2 99%   BMI 32.13 kg/m   Wt Readings from Last 3 Encounters:  08/17/17 181 lb 6.4 oz (82.3 kg)  05/17/17 186 lb 6.4 oz (84.6 kg)  01/15/17 183 lb 12.8 oz (83.4 kg)    Physical Exam  General appearance - alert, well appearing, and in no distress Mental status - alert, oriented to person, place, and time Neck - supple, no significant adenopathy Chest - clear to auscultation, no wheezes, rales or rhonchi, symmetric air entry Heart - normal rate, regular rhythm, normal S1, S2, no murmurs, rubs, clicks or  gallops Extremities - peripheral pulses normal, no pedal edema, no clubbing or cyanosis   Results for orders placed or performed in visit on 08/17/17  Glucose (CBG), Fasting  Result Value Ref Range   Glucose Fasting, POC 120 (A) 70 - 99 mg/dL  HgB A1c  Result Value  Ref Range   Hemoglobin A1C 6.9     ASSESSMENT AND PLAN: 1. Controlled type 2 diabetes mellitus with complication, with long-term current use of insulin ([place]) Keep up the good works.  Encourage her to continue the walking 3 x a wk as she has started to do. - Glucose (CBG), Fasting - HgB A1c  2. Essential hypertension At goal.  Continue current meds  3. Coronary artery disease involving native coronary artery of native heart without angina pectoris Stable. Cont B-blocker, statin, Plavix, Diovan/HCTZ  4. Colon cancer screening - Fecal occult blood, imunochemical([facility])  5. Need for vaccination against Streptococcus pneumoniae - Pneumococcal polysaccharide vaccine 23-valent greater than or equal to 2yo subcutaneous/IM  Pt given info to schedule MMG.  Patient was given the opportunity to ask questions.  Patient verbalized understanding of the plan and was able to repeat key elements of the plan.   Orders Placed This Encounter  Procedures  . Fecal occult blood, imunochemical([facility])  . Pneumococcal polysaccharide vaccine 23-valent greater than or equal to 2yo subcutaneous/IM  . Glucose (CBG), Fasting  . HgB A1c     Requested Prescriptions    No prescriptions requested or ordered in this encounter    Return in about 3 months (around 11/17/2017).  Karle Plumber, MD, FACP

## 2017-08-17 NOTE — Progress Notes (Signed)
3 month DM and HTN RF on Voltaren gel FBS- 120

## 2017-08-17 NOTE — Patient Instructions (Signed)
Your diabetes and blood pressure are well controlled.  Keep up the good works.   Please get your mammogram done as soon as possible.

## 2017-08-28 ENCOUNTER — Other Ambulatory Visit: Payer: Self-pay | Admitting: Internal Medicine

## 2017-08-28 DIAGNOSIS — I639 Cerebral infarction, unspecified: Secondary | ICD-10-CM

## 2017-08-28 MED FILL — VALSARTAN-HCTZ 320-25 MG TA: 320-25 | 30 days supply | Qty: 30 | Fill #2

## 2017-08-28 MED FILL — ?ATENOLOL 100MG TABLET: 100 | 30 days supply | Qty: 30 | Fill #2

## 2017-08-28 MED FILL — ?ATORVASTATIN 40MG TABLET: 40 | 30 days supply | Qty: 30 | Fill #0

## 2017-08-28 MED FILL — $LEVEMIR 100U/ML VIAL: 100 | 28 days supply | Qty: 10 | Fill #1

## 2017-08-28 MED FILL — AMLODIPINE BESYLATE 10 MG T: 10 | 30 days supply | Qty: 30 | Fill #2

## 2017-08-28 MED FILL — $FARXIGA 10 MG TABLET: 10 | 30 days supply | Qty: 30 | Fill #2

## 2017-08-28 MED FILL — ?GLIPIZIDE 10 MG TABLET: 10 | 30 days supply | Qty: 60 | Fill #2

## 2017-08-28 MED FILL — ?METFORMIN HCL 1,000 MG TAB: 1000 | 30 days supply | Qty: 60 | Fill #2

## 2017-09-03 MED FILL — ?CLOPIDOGREL 75MG TA: 75 | 30 days supply | Qty: 30 | Fill #2

## 2017-10-01 ENCOUNTER — Other Ambulatory Visit: Payer: Self-pay | Admitting: Internal Medicine

## 2017-10-01 MED FILL — ?CLOPIDOGREL 75MG TA: 75 | 30 days supply | Qty: 30 | Fill #3

## 2017-10-01 MED FILL — ?ATORVASTATIN 40MG TABLET: 40 | 30 days supply | Qty: 30 | Fill #1

## 2017-10-01 MED FILL — VALSARTAN-HCTZ 320-25 MG TA: 320-25 | 30 days supply | Qty: 30 | Fill #3

## 2017-10-01 MED FILL — $FARXIGA 10 MG TABLET: 10 | 30 days supply | Qty: 30 | Fill #3

## 2017-10-01 MED FILL — AMLODIPINE BESYLATE 10 MG T: 10 | 30 days supply | Qty: 30 | Fill #3

## 2017-10-01 MED FILL — ?METFORMIN HCL 1,000 MG TAB: 1000 | 30 days supply | Qty: 60 | Fill #0

## 2017-10-01 MED FILL — ?ATENOLOL 100MG TABLET: 100 | 30 days supply | Qty: 30 | Fill #3

## 2017-10-01 MED FILL — ?GLIPIZIDE 10 MG TABLET: 10 | 30 days supply | Qty: 60 | Fill #3

## 2017-10-12 ENCOUNTER — Other Ambulatory Visit (HOSPITAL_COMMUNITY): Payer: Self-pay | Admitting: *Deleted

## 2017-10-12 DIAGNOSIS — N6320 Unspecified lump in the left breast, unspecified quadrant: Secondary | ICD-10-CM

## 2017-10-15 ENCOUNTER — Encounter: Payer: Self-pay | Admitting: Internal Medicine

## 2017-10-31 MED FILL — $FARXIGA 10 MG TABLET: 10 | 30 days supply | Qty: 30 | Fill #4

## 2017-10-31 MED FILL — VALSARTAN-HCTZ 320-25 MG TA: 320-25 | 30 days supply | Qty: 30 | Fill #4

## 2017-10-31 MED FILL — ?METFORMIN HCL 1,000 MG TAB: 1000 | 30 days supply | Qty: 60 | Fill #1

## 2017-10-31 MED FILL — $LEVEMIR 100U/ML VIAL: 100 | 42 days supply | Qty: 10 | Fill #2

## 2017-10-31 MED FILL — ?ATORVASTATIN 40MG TABLET: 40 | 30 days supply | Qty: 30 | Fill #2

## 2017-10-31 MED FILL — ?CLOPIDOGREL 75MG TAB: 75 | 30 days supply | Qty: 30 | Fill #4

## 2017-10-31 MED FILL — ?GLIPIZIDE 10 MG TABLET: 10 | 30 days supply | Qty: 60 | Fill #4

## 2017-10-31 MED FILL — AMLODIPINE BESYLATE 10 MG T: 10 | 30 days supply | Qty: 30 | Fill #4

## 2017-11-01 ENCOUNTER — Ambulatory Visit
Admission: RE | Admit: 2017-11-01 | Discharge: 2017-11-01 | Disposition: A | Discharge status: Home / Self Care | Payer: No Typology Code available for payment source | Source: Ambulatory Visit | Attending: Obstetrics and Gynecology | Admitting: Obstetrics and Gynecology

## 2017-11-01 ENCOUNTER — Ambulatory Visit (HOSPITAL_COMMUNITY)
Admission: RE | Admit: 2017-11-01 | Discharge: 2017-11-01 | Disposition: A | Discharge status: Home / Self Care | Payer: Self-pay | Source: Ambulatory Visit | Attending: Obstetrics and Gynecology | Admitting: Obstetrics and Gynecology

## 2017-11-01 ENCOUNTER — Ambulatory Visit: Payer: Medicaid Other

## 2017-11-01 ENCOUNTER — Encounter (HOSPITAL_COMMUNITY): Payer: Self-pay

## 2017-11-01 ENCOUNTER — Encounter (HOSPITAL_COMMUNITY): Payer: Self-pay | Admitting: *Deleted

## 2017-11-01 VITALS — BP 142/73 | Ht 63.0 in | Wt 183.0 lb

## 2017-11-01 DIAGNOSIS — N6320 Unspecified lump in the left breast, unspecified quadrant: Secondary | ICD-10-CM

## 2017-11-01 DIAGNOSIS — Z1239 Encounter for other screening for malignant neoplasm of breast: Secondary | ICD-10-CM

## 2017-11-01 NOTE — Progress Notes (Signed)
Patient referred to [facility] by the [place] due to having a mammogram completed 04/10/2016 in [place] that 76-month left breast diagnostic mammogram was recommended for follow-up.   Pap Smear: Pap smear not completed today. Last Pap smear was 01/15/2017 at [facility] and normal with negative HPV. Per patient has no history of an abnormal Pap smear. Last Pap smear result is in Epic.  Physical exam: Breasts Breasts symmetrical. No skin abnormalities left breast. Scar observed right axilla that per patient was due to a history of an abscess. No nipple retraction bilateral breasts. No nipple discharge bilateral breasts. No lymphadenopathy. No lumps palpated bilateral breasts. No complaints of pain or tenderness on exam. Referred patient to the [facility] for a diagnostic mammogram per recommendation. Appointment scheduled for Thursday, November 01, 2017 at 1240.        Pelvic/Bimanual No Pap smear completed today since last Pap smear and HPV typing was 01/15/2017. Pap smear not indicated per BCCCP guidelines.   Smoking History: Patient has never smoked.  Patient Navigation.  Patient education provided. Access to services provided for patient through [place] program.   Colorectal Cancer Screening: Per patient has never had a colonoscopy completed. No complaints today. FIT Test given to patient to complete and return to BCCCP.  Breast and Cervical Cancer Risk Assessment: Patient has a family history of a maternal aunt having breast cancer. Patient has no known genetic mutations or history of radiation treatment to the chest before age 57. Patient has no history of cervical dysplasia, immunocompromised, or DES exposure in-utero. Patient has a 5-year risk for breast cancer at 3.1% and a lifetime risk at 14.4%.

## 2017-11-01 NOTE — Patient Instructions (Signed)
Explained breast self awareness with [employer]. Patient did not need a Pap smear today due to last Pap smear and HPV typing was 01/15/2017. Let her know BCCCP will cover Pap smears and HPV typing every 5 years unless has a history of abnormal Pap smears. Referred patient to the [facility] for a diagnostic mammogram per recommendation. Appointment scheduled for Thursday, November 01, 2017 at 1240. Breanna Obrien verbalized understanding.  Thyra Yinger, Arvil [place], RN 3:09 PM

## 2017-11-19 ENCOUNTER — Ambulatory Visit: Payer: Self-pay | Attending: Internal Medicine | Admitting: Internal Medicine

## 2017-11-19 ENCOUNTER — Encounter: Payer: Self-pay | Admitting: Internal Medicine

## 2017-11-19 VITALS — BP 130/64 | HR 59 | Temp 98.1°F | Resp 16

## 2017-11-19 DIAGNOSIS — E785 Hyperlipidemia, unspecified: Secondary | ICD-10-CM | POA: Insufficient documentation

## 2017-11-19 DIAGNOSIS — I10 Essential (primary) hypertension: Secondary | ICD-10-CM | POA: Insufficient documentation

## 2017-11-19 DIAGNOSIS — Z79899 Other long term (current) drug therapy: Secondary | ICD-10-CM | POA: Insufficient documentation

## 2017-11-19 DIAGNOSIS — E118 Type 2 diabetes mellitus with unspecified complications: Secondary | ICD-10-CM | POA: Insufficient documentation

## 2017-11-19 DIAGNOSIS — Z7902 Long term (current) use of antithrombotics/antiplatelets: Secondary | ICD-10-CM | POA: Insufficient documentation

## 2017-11-19 DIAGNOSIS — Z794 Long term (current) use of insulin: Secondary | ICD-10-CM | POA: Insufficient documentation

## 2017-11-19 DIAGNOSIS — Z8673 Personal history of transient ischemic attack (TIA), and cerebral infarction without residual deficits: Secondary | ICD-10-CM | POA: Insufficient documentation

## 2017-11-19 DIAGNOSIS — I6523 Occlusion and stenosis of bilateral carotid arteries: Secondary | ICD-10-CM | POA: Insufficient documentation

## 2017-11-19 DIAGNOSIS — Z955 Presence of coronary angioplasty implant and graft: Secondary | ICD-10-CM | POA: Insufficient documentation

## 2017-11-19 DIAGNOSIS — M25571 Pain in right ankle and joints of right foot: Secondary | ICD-10-CM | POA: Insufficient documentation

## 2017-11-19 DIAGNOSIS — I2510 Atherosclerotic heart disease of native coronary artery without angina pectoris: Secondary | ICD-10-CM | POA: Insufficient documentation

## 2017-11-19 DIAGNOSIS — Z9049 Acquired absence of other specified parts of digestive tract: Secondary | ICD-10-CM | POA: Insufficient documentation

## 2017-11-19 DIAGNOSIS — F5101 Primary insomnia: Secondary | ICD-10-CM | POA: Insufficient documentation

## 2017-11-19 LAB — GLUCOSE, POCT (MANUAL RESULT ENTRY): POC Glucose: 127 mg/dl — AB (ref 70–99)

## 2017-11-19 LAB — POCT GLYCOSYLATED HEMOGLOBIN (HGB A1C): HEMOGLOBIN A1C: 6.6 % — AB (ref 4.0–5.6)

## 2017-11-19 NOTE — Patient Instructions (Addendum)
Try taking Melatonin 3 mg 1/2 hour before bedtime. Try wearing an ankle support when you go to exercise.

## 2017-11-19 NOTE — Progress Notes (Signed)
Patient ID: Breanna Obrien, female    DOB: 07/21/1955  MRN: [id number]  CC: Diabetes and Hypertension   Subjective: Breanna Obrien is a 62 y.o. female who presents for chronic ds management.  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  HTN:  Notice BP running higher than expected. Checks Q a.m before taking meds.  Range 140s/80.  When she checks after meds, BP good.  -limits salt in foods Took meds already for today.  DM:  Checking BS 2-3 times a day. A.m range in 80-90s, before lunch 190-200, before dinner 87-100. Med: compliant with meds Farxiga, Metformin, Glucotrol and Levemir Eating habits:  "sometimes I get stress and I don't eat like I suppose to."   Started walking daily 2 weeks ago.  Some pain in RT ankle after walking a lap.  Had sprain ankle several yrs ago and ever since then, ankle hurts if she does a lot of walking  CAD:  No CP/SOB/PND/orthopnea. Compliant with Plavix, Atenolol, Lipitor and Diovan/HCTZ  Problems falling and staying asleep still.  She stopped the Trazodone because it made her feel jittery. Normally get in bed b/w 11-11:30 p.m.  She turns off all lights and sounds.  She turns cell phone off also.  Her husband does know which sometimes keeps her awake.  She often has to go into another room to sleep. -drinks coffee only in a.m and a diet Coke.   HM: [facility] -went to have [facility]G 11/01/2017 but they did not have the MMG films from [place] as yet.  She did release for them to get the films.  They told her they will call her in about 2 wks, if not she should call them. FIT test - sent in the end of last wk.   Patient Active Problem List   Diagnosis Date Noted  . Abnormal mammogram 01/15/2017  . Coronary artery disease involving native coronary artery of native heart without angina pectoris 01/15/2017  . Hx  of Bell's palsy 01/15/2017  . Insomnia due to medical condition 10/24/2016  . Depression 10/24/2016  . Type 2 diabetes mellitus with complication, without long-term current use of insulin ([place]) 10/06/2016  . Essential hypertension 10/06/2016  . Ataxia   . Hyperlipidemia   . Bilateral carotid artery stenosis   . CVA (cerebral vascular accident) ([place]) 10/05/2016     Current Outpatient Medications on File Prior to Visit  Medication Sig Dispense Refill  . amLODipine (NORVASC) 10 MG tablet Take 1 tablet (10 mg total) by mouth daily. 30 tablet 11  . atenolol (TENORMIN) 100 MG tablet Take 1 tablet (100 mg total) by mouth every evening. 30 tablet 11  . atorvastatin (LIPITOR) 40 MG tablet TAKE 1 TABLET BY MOUTH ONCE DAILY AT 6PM 30 tablet 2  . calcium-vitamin D (OSCAL WITH D) 500-200 MG-UNIT tablet Take 1 tablet by mouth.    . cholecalciferol (VITAMIN D) 400 units TABS tablet Take 400 Units by mouth.    . clopidogrel (PLAVIX) 75 MG tablet Take 1 tablet (75 mg total) by mouth daily. 30 tablet 11  . co-enzyme Q-10 30 MG capsule Take 30 mg by mouth 3 (three) times daily.    . dapagliflozin propanediol (FARXIGA) 10 MG TABS tablet Take 10 mg by mouth daily. 30 tablet 6  . glipiZIDE (GLUCOTROL) 10 MG tablet Take 1 tablet (10 mg  total) by mouth 2 (two) times daily before a meal. 60 tablet 11  . insulin detemir (LEVEMIR) 100 UNIT/ML injection Inject 0.14 mLs (14 Units total) into the skin at bedtime. 10 mL 6  . Insulin Syringe-Needle U-100 (INSULIN SYRINGE .5CC/30GX1/2") 30G X 1/2" 0.5 ML MISC Use as directed 100 each 6  . metFORMIN (GLUCOPHAGE) 1000 MG tablet TAKE 1 TABLET BY MOUTH TWICE DAILY 60 tablet 2  . niacin 500 MG tablet Take 500 mg by mouth at bedtime.    . pneumococcal 23 valent vaccine (PNEUMOVAX 23) 25 MCG/0.5ML injection To be administered by the pharmacist (Patient not taking: Reported on 11/01/2017) 0.5 mL 0  . valsartan-hydrochlorothiazide (DIOVAN-HCT) 320-25 MG tablet Take 1 tablet by mouth  daily. 30 tablet 11   No current facility-administered medications on file prior to visit.     No Known Allergies  Social History   Socioeconomic History  . Marital status: Married    Spouse name: Not on file  . Number of children: Not on file  . Years of education: Not on file  . Highest education level: Not on file  Occupational History  . Not on file  Social Needs  . Financial resource strain: Not on file  . Food insecurity:    Worry: Not on file    Inability: Not on file  . Transportation needs:    Medical: Not on file    Non-medical: Not on file  Tobacco Use  . Smoking status: Never Smoker  . Smokeless tobacco: Never Used  Substance and Sexual Activity  . Alcohol use: No  . Drug use: No  . Sexual activity: Yes    Birth control/protection: None  Lifestyle  . Physical activity:    Days per week: Not on file    Minutes per session: Not on file  . Stress: Not on file  Relationships  . Social connections:    Talks on phone: Not on file    Gets together: Not on file    Attends religious service: Not on file    Active member of club or organization: Not on file    Attends meetings of clubs or organizations: Not on file    Relationship status: Not on file  . Intimate partner violence:    Fear of current or ex partner: Not on file    Emotionally abused: Not on file    Physically abused: Not on file    Forced sexual activity: Not on file  Other Topics Concern  . Not on file  Social History Narrative  . Not on file    Family History  Problem Relation Age of Onset  . Cancer Mother     Past Surgical History:  Procedure Laterality Date  . CHOLECYSTECTOMY    . CORONARY ANGIOPLASTY WITH STENT PLACEMENT      ROS: Review of Systems Negative except as stated above PHYSICAL EXAM: BP 130/64   Pulse (!) 59   Temp 98.1 F (36.7 C) (Oral)   Resp 16   SpO2 97%   Wt Readings from Last 3 Encounters:  11/01/17 183 lb (83 kg)  08/17/17 181 lb 6.4 oz (82.3 kg)    05/17/17 186 lb 6.4 oz (84.6 kg)    Physical Exam  General appearance - alert, well appearing, and in no distress Mental status - normal mood, behavior, speech, dress, motor activity, and thought processes Neck - supple, no significant adenopathy Chest - clear to auscultation, no wheezes, rales or rhonchi, symmetric air entry Heart -  normal rate, regular rhythm, normal S1, S2, no murmurs, rubs, clicks or gallops Musculoskeletal -right ankle: No edema or erythema.  No point tenderness.  Good range of motion.   Extremities - peripheral pulses normal, no pedal edema, no clubbing or cyanosis  Results for orders placed or performed in visit on 11/19/17  POCT glucose (manual entry)  Result Value Ref Range   POC Glucose 127 (A) 70 - 99 mg/dl  POCT glycosylated hemoglobin (Hb A1C)  Result Value Ref Range   Hemoglobin A1C 6.6 (A) 4.0 - 5.6 %   HbA1c POC (<> result, manual entry)  4.0 - 5.6 %   HbA1c, POC (prediabetic range)  5.7 - 6.4 %   HbA1c, POC (controlled diabetic range)  0.0 - 7.0 %     ASSESSMENT AND PLAN: 1. Type 2 diabetes mellitus with complication, with long-term current use of insulin (HCC) At goal.  Continue current medications Encourage healthy eating habits Advised to try using an ankle wrap or splint on the right ankle when walking - POCT glucose (manual entry) - POCT glycosylated hemoglobin (Hb A1C)  2. Essential hypertension At goal.  Continue current medications and low-salt diet  3. Coronary artery disease involving native coronary artery of native heart without angina pectoris Stable.  Continue guideline directed medications.  4. Primary insomnia She seems to already practice good sleep hygiene.  I recommend trying over-the-counter melatonin.  Trazodone discontinued   Patient was given the opportunity to ask questions.  Patient verbalized understanding of the plan and was able to repeat key elements of the plan.   Orders Placed This Encounter  Procedures   . POCT glucose (manual entry)  . POCT glycosylated hemoglobin (Hb A1C)     Requested Prescriptions    No prescriptions requested or ordered in this encounter    Return in about 3 months (around 02/19/2018).  Karle Plumber, MD, FACP

## 2017-11-28 ENCOUNTER — Other Ambulatory Visit: Payer: Self-pay

## 2017-11-28 ENCOUNTER — Other Ambulatory Visit: Payer: Self-pay | Admitting: Internal Medicine

## 2017-11-28 DIAGNOSIS — I639 Cerebral infarction, unspecified: Secondary | ICD-10-CM

## 2017-11-28 MED FILL — ?ATORVASTATIN 40MG TABLET: 40 | 30 days supply | Qty: 30 | Fill #0

## 2017-11-28 MED FILL — VALSARTAN-HCTZ 320-25 MG TA: 320-25 | 30 days supply | Qty: 30 | Fill #5

## 2017-11-28 MED FILL — AMLODIPINE BESYLATE 10 MG T: 10 | 30 days supply | Qty: 30 | Fill #5

## 2017-11-28 MED FILL — $FARXIGA 10 MG TABLET: 10 | 30 days supply | Qty: 30 | Fill #5

## 2017-11-28 MED FILL — ?ATENOLOL 100MG TABLET: 100 | 30 days supply | Qty: 30 | Fill #4

## 2017-11-28 MED FILL — ?CLOPIDOGREL 75MG TAB: 75 | 30 days supply | Qty: 30 | Fill #5

## 2017-12-04 LAB — FECAL OCCULT BLOOD, IMMUNOCHEMICAL: Fecal Occult Bld: NEGATIVE

## 2017-12-04 MED FILL — ?GLIPIZIDE 10 MG TABLET: 10 | 30 days supply | Qty: 60 | Fill #5

## 2017-12-04 MED FILL — ?METFORMIN HCL 1,000 MG TAB: 1000 | 30 days supply | Qty: 60 | Fill #2

## 2017-12-05 ENCOUNTER — Encounter (HOSPITAL_COMMUNITY): Payer: Self-pay | Admitting: *Deleted

## 2017-12-05 NOTE — Progress Notes (Signed)
Letter mailed to patient with negative Fit Test results.  

## 2017-12-10 ENCOUNTER — Encounter: Payer: Self-pay | Admitting: Internal Medicine

## 2017-12-21 ENCOUNTER — Other Ambulatory Visit (HOSPITAL_COMMUNITY): Payer: Self-pay | Admitting: Obstetrics and Gynecology

## 2017-12-21 ENCOUNTER — Ambulatory Visit
Admission: RE | Admit: 2017-12-21 | Discharge: 2017-12-21 | Disposition: A | Discharge status: Home / Self Care | Payer: Self-pay | Source: Ambulatory Visit | Attending: Obstetrics and Gynecology | Admitting: Obstetrics and Gynecology

## 2017-12-21 ENCOUNTER — Ambulatory Visit
Admission: RE | Admit: 2017-12-21 | Discharge: 2017-12-21 | Disposition: A | Discharge status: Home / Self Care | Payer: No Typology Code available for payment source | Source: Ambulatory Visit | Attending: Obstetrics and Gynecology | Admitting: Obstetrics and Gynecology

## 2017-12-21 DIAGNOSIS — R921 Mammographic calcification found on diagnostic imaging of breast: Secondary | ICD-10-CM

## 2017-12-21 DIAGNOSIS — N6320 Unspecified lump in the left breast, unspecified quadrant: Secondary | ICD-10-CM

## 2018-01-02 ENCOUNTER — Other Ambulatory Visit: Payer: Self-pay | Admitting: Internal Medicine

## 2018-01-02 DIAGNOSIS — Z794 Long term (current) use of insulin: Principal | ICD-10-CM

## 2018-01-02 DIAGNOSIS — I639 Cerebral infarction, unspecified: Secondary | ICD-10-CM

## 2018-01-02 DIAGNOSIS — E118 Type 2 diabetes mellitus with unspecified complications: Secondary | ICD-10-CM

## 2018-01-02 MED FILL — VALSARTAN-HCTZ 320-25 MG TA: 320-25 | 30 days supply | Qty: 30 | Fill #6

## 2018-01-02 MED FILL — $LEVEMIR 100U/ML VIAL: 100 | 42 days supply | Qty: 10 | Fill #3

## 2018-01-02 MED FILL — ?ATORVASTATIN 40MG TABLET: 40 | 30 days supply | Qty: 30 | Fill #0

## 2018-01-02 MED FILL — $FARXIGA 10 MG TABLET: 10 | 90 days supply | Qty: 90 | Fill #0

## 2018-01-02 MED FILL — ?METFORMIN HCL 1000MG TABS: 1000 | 30 days supply | Qty: 60 | Fill #0

## 2018-01-02 MED FILL — glipiZIDE 10 MG TABS: 10 | 30 days supply | Qty: 60 | Fill #6

## 2018-01-02 MED FILL — ?CLOPIDOGREL 75MG TAB: 75 | 30 days supply | Qty: 30 | Fill #6

## 2018-01-02 MED FILL — ?ATENOLOL 100MG TABLET: 100 | 30 days supply | Qty: 30 | Fill #5

## 2018-01-02 MED FILL — AMLODIPINE BESYLATE 10 MG T: 10 | 30 days supply | Qty: 30 | Fill #6

## 2018-01-14 ENCOUNTER — Ambulatory Visit: Payer: Self-pay | Attending: Family Medicine

## 2018-02-04 ENCOUNTER — Other Ambulatory Visit: Payer: Self-pay | Admitting: Internal Medicine

## 2018-02-04 DIAGNOSIS — I639 Cerebral infarction, unspecified: Secondary | ICD-10-CM

## 2018-02-04 DIAGNOSIS — E118 Type 2 diabetes mellitus with unspecified complications: Secondary | ICD-10-CM

## 2018-02-04 DIAGNOSIS — I10 Essential (primary) hypertension: Secondary | ICD-10-CM

## 2018-02-04 MED FILL — ?METFORMIN HCL 1000MG TABS: 1000 | 30 days supply | Qty: 60 | Fill #1

## 2018-02-05 MED FILL — ?ATENOLOL 100MG TABLET: 100 | 30 days supply | Qty: 30 | Fill #0

## 2018-02-05 MED FILL — VALSARTAN-HCTZ 320-25 MG TA: 320-25 | 30 days supply | Qty: 30 | Fill #0

## 2018-02-05 MED FILL — glipiZIDE 10 MG TABS: 10 | 30 days supply | Qty: 60 | Fill #0

## 2018-02-05 MED FILL — ?ATORVASTATIN 40MG TABLET: 40 | 30 days supply | Qty: 30 | Fill #0

## 2018-02-05 MED FILL — AMLODIPINE BESYLATE 10 MG T: 10 | 30 days supply | Qty: 30 | Fill #0

## 2018-02-05 MED FILL — $LEVEMIR 100U/ML VIAL: 100 | 42 days supply | Qty: 10 | Fill #4

## 2018-02-08 ENCOUNTER — Encounter: Payer: Self-pay | Admitting: Internal Medicine

## 2018-02-08 ENCOUNTER — Ambulatory Visit: Payer: Self-pay | Attending: Internal Medicine | Admitting: Internal Medicine

## 2018-02-08 VITALS — BP 129/82 | HR 63 | Temp 98.2°F | Resp 16 | Wt 183.0 lb

## 2018-02-08 DIAGNOSIS — I6523 Occlusion and stenosis of bilateral carotid arteries: Secondary | ICD-10-CM | POA: Insufficient documentation

## 2018-02-08 DIAGNOSIS — Z955 Presence of coronary angioplasty implant and graft: Secondary | ICD-10-CM | POA: Insufficient documentation

## 2018-02-08 DIAGNOSIS — IMO0002 Reserved for concepts with insufficient information to code with codable children: Secondary | ICD-10-CM

## 2018-02-08 DIAGNOSIS — Z794 Long term (current) use of insulin: Secondary | ICD-10-CM | POA: Insufficient documentation

## 2018-02-08 DIAGNOSIS — G510 Bell's palsy: Secondary | ICD-10-CM | POA: Insufficient documentation

## 2018-02-08 DIAGNOSIS — I2510 Atherosclerotic heart disease of native coronary artery without angina pectoris: Secondary | ICD-10-CM | POA: Insufficient documentation

## 2018-02-08 DIAGNOSIS — I10 Essential (primary) hypertension: Secondary | ICD-10-CM | POA: Insufficient documentation

## 2018-02-08 DIAGNOSIS — E1165 Type 2 diabetes mellitus with hyperglycemia: Secondary | ICD-10-CM | POA: Insufficient documentation

## 2018-02-08 DIAGNOSIS — E118 Type 2 diabetes mellitus with unspecified complications: Secondary | ICD-10-CM

## 2018-02-08 DIAGNOSIS — Z6379 Other stressful life events affecting family and household: Secondary | ICD-10-CM

## 2018-02-08 DIAGNOSIS — I639 Cerebral infarction, unspecified: Secondary | ICD-10-CM

## 2018-02-08 DIAGNOSIS — Z23 Encounter for immunization: Secondary | ICD-10-CM | POA: Insufficient documentation

## 2018-02-08 DIAGNOSIS — Z8673 Personal history of transient ischemic attack (TIA), and cerebral infarction without residual deficits: Secondary | ICD-10-CM | POA: Insufficient documentation

## 2018-02-08 LAB — GLUCOSE, POCT (MANUAL RESULT ENTRY): POC Glucose: 245 mg/dl — AB (ref 70–99)

## 2018-02-08 MED ORDER — GLIPIZIDE 10 MG PO TABS: ORAL_TABLET | ORAL | 11 refills | Status: DC

## 2018-02-08 MED ORDER — INSULIN DETEMIR 100 UNIT/ML ~~LOC~~ SOLN: 17.0000 [IU] | Freq: Every day | SUBCUTANEOUS | 6 refills | Status: DC

## 2018-02-08 MED ORDER — DAPAGLIFLOZIN PROPANEDIOL 10 MG PO TABS: 10.0000 mg | ORAL_TABLET | Freq: Every day | ORAL | 11 refills | Status: DC

## 2018-02-08 MED ORDER — ATORVASTATIN CALCIUM 40 MG PO TABS: 40.0000 mg | ORAL_TABLET | Freq: Every day | ORAL | 11 refills | Status: DC

## 2018-02-08 MED ORDER — ATENOLOL 100 MG PO TABS: 100.0000 mg | ORAL_TABLET | Freq: Every evening | ORAL | 3 refills | Status: DC

## 2018-02-08 MED ORDER — VALSARTAN-HYDROCHLOROTHIAZIDE 320-25 MG PO TABS: 1.0000 | ORAL_TABLET | Freq: Every day | ORAL | 11 refills | Status: DC

## 2018-02-08 MED ORDER — METFORMIN HCL 1000 MG PO TABS: 1000.0000 mg | ORAL_TABLET | Freq: Two times a day (BID) | ORAL | 11 refills | Status: DC

## 2018-02-08 MED ORDER — AMLODIPINE BESYLATE 10 MG PO TABS: 10.0000 mg | ORAL_TABLET | Freq: Every day | ORAL | 3 refills | Status: DC

## 2018-02-08 MED FILL — ?CLOPIDOGREL 75MG TA: 75 | 30 days supply | Qty: 30 | Fill #0

## 2018-02-08 NOTE — Patient Instructions (Addendum)
Increase Levemir to 17 units daily.   Influenza Virus Vaccine injection (Fluarix) What is this medicine? INFLUENZA VIRUS VACCINE (in floo EN zuh VAHY ruhs vak SEEN) helps to reduce the risk of getting influenza also known as the flu. This medicine may be used for other purposes; ask your health care provider or pharmacist if you have questions. COMMON BRAND NAME(S): Fluarix, Fluzone What should I tell my health care provider before I take this medicine? They need to know if you have any of these conditions: -bleeding disorder like hemophilia -fever or infection -Guillain-Barre syndrome or other neurological problems -immune system problems -infection with the human immunodeficiency virus (HIV) or AIDS -low blood platelet counts -multiple sclerosis -an unusual or allergic reaction to influenza virus vaccine, eggs, chicken proteins, latex, gentamicin, other medicines, foods, dyes or preservatives -pregnant or trying to get pregnant -breast-feeding How should I use this medicine? This vaccine is for injection into a muscle. It is given by a health care professional. A copy of Vaccine Information Statements will be given before each vaccination. Read this sheet carefully each time. The sheet may change frequently. Talk to your pediatrician regarding the use of this medicine in children. Special care may be needed. Overdosage: If you think you have taken too much of this medicine contact a poison control center or emergency room at once. NOTE: This medicine is only for you. Do not share this medicine with others. What if I miss a dose? This does not apply. What may interact with this medicine? -chemotherapy or radiation therapy -medicines that lower your immune system like etanercept, anakinra, infliximab, and adalimumab -medicines that treat or prevent blood clots like warfarin -phenytoin -steroid medicines like prednisone or cortisone -theophylline -vaccines This list may not describe  all possible interactions. Give your health care provider a list of all the medicines, herbs, non-prescription drugs, or dietary supplements you use. Also tell them if you smoke, drink alcohol, or use illegal drugs. Some items may interact with your medicine. What should I watch for while using this medicine? Report any side effects that do not go away within 3 days to your doctor or health care professional. Call your health care provider if any unusual symptoms occur within 6 weeks of receiving this vaccine. You may still catch the flu, but the illness is not usually as bad. You cannot get the flu from the vaccine. The vaccine will not protect against colds or other illnesses that may cause fever. The vaccine is needed every year. What side effects may I notice from receiving this medicine? Side effects that you should report to your doctor or health care professional as soon as possible: -allergic reactions like skin rash, itching or hives, swelling of the face, lips, or tongue Side effects that usually do not require medical attention (report to your doctor or health care professional if they continue or are bothersome): -fever -headache -muscle aches and pains -pain, tenderness, redness, or swelling at site where injected -weak or tired This list may not describe all possible side effects. Call your doctor for medical advice about side effects. You may report side effects to FDA at 1-800-FDA-1088. Where should I keep my medicine? This vaccine is only given in a clinic, pharmacy, doctor's office, or other health care setting and will not be stored at home. NOTE: This sheet is a summary. It may not cover all possible information. If you have questions about this medicine, talk to your doctor, pharmacist, or health care provider.  2018 Elsevier/Gold  Standard (2007-12-11 09:30:40)

## 2018-02-08 NOTE — Progress Notes (Signed)
Patient ID: Breanna Obrien, female    DOB: 1956/05/07  MRN: [id number]  CC: Diabetes and Hypertension   Subjective: Breanna Obrien is a 62 y.o. female who presents for chronic disease management. 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  C/o having a very stressful past 2 mths due to her husband being hospitalized. Reports she has not been taking care of herself during all of this.  Just started trying to get back to normal.  DM:  Checking BS 2-3 x a day.  A.m 150-190s Was out of meds for 1 wk until 3 days ago.  Started walking for the past 2 wks, 5 days a wk for 1/4 miles.  Eating habits improve over the same time.  HTN: Reports compliance with medications but was out for a week until 3 days ago.  No headaches or dizziness.  No lower extremity edema.  MMG: She was able to have her mammogram done.  She requires short-term follow-up in January of next year.  HM: Due for flu vaccine which she is agreeable to having done today.  Patient Active Problem List   Diagnosis Date Noted  . Abnormal mammogram 01/15/2017  . Coronary artery disease involving native coronary artery of native heart without angina pectoris 01/15/2017  . Hx of Bell's palsy 01/15/2017  . Insomnia due to medical condition 10/24/2016  . Depression 10/24/2016  . Type 2 diabetes mellitus with complication, without long-term current use of insulin ([place]) 10/06/2016  . Essential hypertension 10/06/2016  . Ataxia   . Hyperlipidemia   . Bilateral carotid artery stenosis   . CVA (cerebral vascular accident) ([place]) 10/05/2016     Current Outpatient Medications on File Prior to Visit  Medication Sig Dispense Refill  . calcium-vitamin D (OSCAL WITH D) 500-200 MG-UNIT tablet Take 1 tablet by mouth.    . cholecalciferol (VITAMIN D) 400 units TABS tablet Take 400  Units by mouth.    . clopidogrel (PLAVIX) 75 MG tablet TAKE 1 TABLET BY MOUTH ONCE DAILY 30 tablet 2  . co-enzyme Q-10 30 MG capsule Take 30 mg by mouth 3 (three) times daily.    . insulin detemir (LEVEMIR) 100 UNIT/ML injection Inject 0.14 mLs (14 Units total) into the skin at bedtime. 10 mL 6  . Insulin Syringe-Needle U-100 (INSULIN SYRINGE .5CC/30GX1/2") 30G X 1/2" 0.5 ML MISC Use as directed 100 each 6  . niacin 500 MG tablet Take 500 mg by mouth at bedtime.    . pneumococcal 23 valent vaccine (PNEUMOVAX 23) 25 MCG/0.5ML injection To be administered by the pharmacist (Patient not taking: Reported on 11/01/2017) 0.5 mL 0   No current facility-administered medications on file prior to visit.     Allergies  Allergen Reactions  . Trazodone And Nefazodone     Makes her feel jittery    Social History   Socioeconomic History  . Marital status: Married    Spouse name: Not on file  . Number of children: Not on file  . Years of education: Not on file  . Highest education level: Not on file  Occupational History  . Not on file  Social Needs  . Financial resource strain: Not on file  . Food insecurity:    Worry: Not on file    Inability: Not on file  . Transportation needs:    Medical: Not  on file    Non-medical: Not on file  Tobacco Use  . Smoking status: Never Smoker  . Smokeless tobacco: Never Used  Substance and Sexual Activity  . Alcohol use: No  . Drug use: No  . Sexual activity: Yes    Birth control/protection: None  Lifestyle  . Physical activity:    Days per week: Not on file    Minutes per session: Not on file  . Stress: Not on file  Relationships  . Social connections:    Talks on phone: Not on file    Gets together: Not on file    Attends religious service: Not on file    Active member of club or organization: Not on file    Attends meetings of clubs or organizations: Not on file    Relationship status: Not on file  . Intimate partner violence:    Fear of  current or ex partner: Not on file    Emotionally abused: Not on file    Physically abused: Not on file    Forced sexual activity: Not on file  Other Topics Concern  . Not on file  Social History Narrative  . Not on file    Family History  Problem Relation Age of Onset  . Cancer Mother     Past Surgical History:  Procedure Laterality Date  . CHOLECYSTECTOMY    . CORONARY ANGIOPLASTY WITH STENT PLACEMENT      ROS: Review of Systems Negative except as stated above. PHYSICAL EXAM: BP 129/82   Pulse 63   Temp 98.2 F (36.8 C) (Oral)   Resp 16   Wt 183 lb (83 kg)   SpO2 99%   BMI 32.42 kg/m   Physical Exam  General appearance - alert, well appearing, and in no distress Mental status - normal mood, behavior, speech, dress, motor activity, and thought processes Neck - supple, no significant adenopathy Chest - clear to auscultation, no wheezes, rales or rhonchi, symmetric air entry Heart - normal rate, regular rhythm, normal S1, S2, no murmurs, rubs, clicks or gallops Extremities - peripheral pulses normal, no pedal edema, no clubbing or cyanosis Diabetic Foot Exam - Simple   Simple Foot Form Visual Inspection No deformities, no ulcerations, no other skin breakdown bilaterally:  Yes Sensation Testing Intact to touch and monofilament testing bilaterally:  Yes Pulse Check Posterior Tibialis and Dorsalis pulse intact bilaterally:  Yes Comments      Results for orders placed or performed in visit on 02/08/18  POCT glucose (manual entry)  Result Value Ref Range   POC Glucose 245 (A) 70 - 99 mg/dl   Lab Results  Component Value Date   HGBA1C 6.6 (A) 11/19/2017     ASSESSMENT AND PLAN: 1. Uncontrolled type 2 diabetes mellitus with complication, with long-term current use of insulin ([place]) Discussed the importance of healthy eating habits, regular aerobic exercise (at least 150 minutes a week as tolerated) and medication compliance to achieve or maintain control of  diabetes.  Commended her on starting her exercise routine and getting back to healthy eating habits. Increase Levemir from 14 units daily to 17 units daily. - POCT glucose (manual entry) - glipiZIDE (GLUCOTROL) 10 MG tablet; TAKE 1 TABLET BY MOUTH TWICE DAILY BEFORE A MEAL  Dispense: 60 tablet; Refill: 11 - metFORMIN (GLUCOPHAGE) 1000 MG tablet; Take 1 tablet (1,000 mg total) by mouth 2 (two) times daily.  Dispense: 60 tablet; Refill: 11  2. Essential hypertension Close to goal of 130/80 or lower.  Continue current medications. - atenolol (TENORMIN) 100 MG tablet; Take 1 tablet (100 mg total) by mouth every evening.  Dispense: 90 tablet; Refill: 3 - amLODipine (NORVASC) 10 MG tablet; Take 1 tablet (10 mg total) by mouth daily.  Dispense: 90 tablet; Refill: 3 - valsartan-hydrochlorothiazide (DIOVAN-HCT) 320-25 MG tablet; Take 1 tablet by mouth daily.  Dispense: 30 tablet; Refill: 11  3. Cerebrovascular accident (CVA), unspecified mechanism ([place]) - atorvastatin (LIPITOR) 40 MG tablet; Take 1 tablet (40 mg total) by mouth daily at 6 PM.  Dispense: 30 tablet; Refill: 11  4. Need for immunization against influenza - Flu Vaccine QUAD 36+ mos IM  5. Stressful life event affecting family   Patient was given the opportunity to ask questions.  Patient verbalized understanding of the plan and was able to repeat key elements of the plan.   Orders Placed This Encounter  Procedures  . Flu Vaccine QUAD 36+ mos IM  . POCT glucose (manual entry)     Requested Prescriptions   Signed Prescriptions Disp Refills  . atenolol (TENORMIN) 100 MG tablet 90 tablet 3    Sig: Take 1 tablet (100 mg total) by mouth every evening.  Marland Kitchen amLODipine (NORVASC) 10 MG tablet 90 tablet 3    Sig: Take 1 tablet (10 mg total) by mouth daily.  . dapagliflozin propanediol (FARXIGA) 10 MG TABS tablet 30 tablet 11    Sig: Take 10 mg by mouth daily.  Marland Kitchen glipiZIDE (GLUCOTROL) 10 MG tablet 60 tablet 11    Sig: TAKE 1 TABLET BY  MOUTH TWICE DAILY BEFORE A MEAL  . metFORMIN (GLUCOPHAGE) 1000 MG tablet 60 tablet 11    Sig: Take 1 tablet (1,000 mg total) by mouth 2 (two) times daily.  . valsartan-hydrochlorothiazide (DIOVAN-HCT) 320-25 MG tablet 30 tablet 11    Sig: Take 1 tablet by mouth daily.  Marland Kitchen atorvastatin (LIPITOR) 40 MG tablet 30 tablet 11    Sig: Take 1 tablet (40 mg total) by mouth daily at 6 PM.    Return in about 3 months (around 05/10/2018).  Karle Plumber, MD, FACP

## 2018-02-21 ENCOUNTER — Ambulatory Visit: Payer: No Typology Code available for payment source | Admitting: Internal Medicine

## 2018-03-04 MED FILL — AMLODIPINE BESYLATE 10 MG T: 10 | 30 days supply | Qty: 30 | Fill #0

## 2018-03-04 MED FILL — ?METFORMIN HCL 1000MG TABS: 1000 | 30 days supply | Qty: 60 | Fill #2

## 2018-03-04 MED FILL — ?ATORVASTATIN 40MG TABLET: 40 | 30 days supply | Qty: 30 | Fill #0

## 2018-03-04 MED FILL — ATENOLOL 100 MG TABLET: 100 | 30 days supply | Qty: 30 | Fill #0

## 2018-03-04 MED FILL — ?CLOPIDOGREL 75MG TA: 75 | 30 days supply | Qty: 30 | Fill #1

## 2018-03-04 MED FILL — glipiZIDE 10 MG TABS: 10 | 30 days supply | Qty: 60 | Fill #0

## 2018-03-07 ENCOUNTER — Other Ambulatory Visit: Payer: Self-pay | Admitting: Pharmacist

## 2018-03-07 MED ORDER — VALSARTAN 320 MG PO TABS: 320.0000 mg | ORAL_TABLET | Freq: Every day | ORAL | 2 refills | Status: DC

## 2018-03-07 MED ORDER — HYDROCHLOROTHIAZIDE 25 MG PO TABS: 25.0000 mg | ORAL_TABLET | Freq: Every day | ORAL | 2 refills | Status: DC

## 2018-03-07 MED FILL — $LEVEMIR 100U/ML VIAL: 100 | 42 days supply | Qty: 10 | Fill #0

## 2018-03-08 MED FILL — HYDROCHLOROTHIAZIDE 25 MG T: 25 | 30 days supply | Qty: 30 | Fill #0

## 2018-03-08 MED FILL — VALSARTAN 320 MG TAB: 320 | 30 days supply | Qty: 30 | Fill #0

## 2018-04-01 ENCOUNTER — Other Ambulatory Visit: Payer: Self-pay | Admitting: Internal Medicine

## 2018-04-01 MED FILL — ?ATORVASTATIN 40MG TABLET: 40 | 30 days supply | Qty: 30 | Fill #1

## 2018-04-01 MED FILL — ?CLOPIDOGREL 75MG TA: 75 | 30 days supply | Qty: 30 | Fill #2

## 2018-04-01 MED FILL — ?METFORMIN HCL 1000MG TABS: 1000 | 30 days supply | Qty: 60 | Fill #0

## 2018-04-01 MED FILL — VALSARTAN 320 MG TAB: 320 | 30 days supply | Qty: 30 | Fill #1

## 2018-04-01 MED FILL — ?GLIPIZIDE 10 MG TABLET: 10 | 30 days supply | Qty: 60 | Fill #1

## 2018-04-01 MED FILL — AMLODIPINE BESYLATE 10 MG T: 10 | 30 days supply | Qty: 30 | Fill #1

## 2018-04-01 MED FILL — HYDROCHLOROTHIAZIDE 25 MG T: 25 | 30 days supply | Qty: 30 | Fill #1

## 2018-04-01 MED FILL — ATENOLOL 100 MG TABLET: 100 | 30 days supply | Qty: 30 | Fill #1

## 2018-04-04 MED FILL — $FARXIGA 10 MG TABLET: 10 | 90 days supply | Qty: 90 | Fill #0

## 2018-05-03 ENCOUNTER — Other Ambulatory Visit: Payer: Self-pay | Admitting: Internal Medicine

## 2018-05-03 DIAGNOSIS — I639 Cerebral infarction, unspecified: Secondary | ICD-10-CM

## 2018-05-03 MED FILL — $LEVEMIR 100U/ML VIAL: 100 | 126 days supply | Qty: 30 | Fill #1

## 2018-05-03 MED FILL — HYDROCHLOROTHIAZIDE 25 MG T: 25 | 30 days supply | Qty: 30 | Fill #2

## 2018-05-03 MED FILL — ?ATORVASTATIN 40MG TABLET: 40 | 30 days supply | Qty: 30 | Fill #2

## 2018-05-03 MED FILL — VALSARTAN 320 MG TAB: 320 | 30 days supply | Qty: 30 | Fill #2

## 2018-05-03 MED FILL — AMLODIPINE BESYLATE 10 MG T: 10 | 30 days supply | Qty: 30 | Fill #2

## 2018-05-03 MED FILL — ?GLIPIZIDE 10 MG TABLET: 10 | 30 days supply | Qty: 60 | Fill #2

## 2018-05-03 MED FILL — ?METFORMIN HCL 1,000 MG TAB: 1000 | 30 days supply | Qty: 60 | Fill #1

## 2018-05-03 MED FILL — ?ATENOLOL 100MG TABLET: 100 | 30 days supply | Qty: 30 | Fill #2

## 2018-05-06 MED FILL — CLOPIDOGREL 75 MG TABLET: 75 | 30 days supply | Qty: 30 | Fill #0

## 2018-05-14 ENCOUNTER — Ambulatory Visit: Payer: Self-pay | Attending: Internal Medicine | Admitting: Internal Medicine

## 2018-05-14 VITALS — BP 132/75 | HR 73 | Temp 97.7°F | Wt 183.8 lb

## 2018-05-14 DIAGNOSIS — I10 Essential (primary) hypertension: Secondary | ICD-10-CM | POA: Insufficient documentation

## 2018-05-14 DIAGNOSIS — F329 Major depressive disorder, single episode, unspecified: Secondary | ICD-10-CM | POA: Insufficient documentation

## 2018-05-14 DIAGNOSIS — Z79899 Other long term (current) drug therapy: Secondary | ICD-10-CM | POA: Insufficient documentation

## 2018-05-14 DIAGNOSIS — Z955 Presence of coronary angioplasty implant and graft: Secondary | ICD-10-CM | POA: Insufficient documentation

## 2018-05-14 DIAGNOSIS — I2510 Atherosclerotic heart disease of native coronary artery without angina pectoris: Secondary | ICD-10-CM | POA: Insufficient documentation

## 2018-05-14 DIAGNOSIS — Z8673 Personal history of transient ischemic attack (TIA), and cerebral infarction without residual deficits: Secondary | ICD-10-CM | POA: Insufficient documentation

## 2018-05-14 DIAGNOSIS — E1159 Type 2 diabetes mellitus with other circulatory complications: Secondary | ICD-10-CM | POA: Insufficient documentation

## 2018-05-14 DIAGNOSIS — Z6832 Body mass index (BMI) 32.0-32.9, adult: Secondary | ICD-10-CM | POA: Insufficient documentation

## 2018-05-14 DIAGNOSIS — E6609 Other obesity due to excess calories: Secondary | ICD-10-CM | POA: Insufficient documentation

## 2018-05-14 DIAGNOSIS — E7849 Other hyperlipidemia: Secondary | ICD-10-CM | POA: Insufficient documentation

## 2018-05-14 DIAGNOSIS — E1165 Type 2 diabetes mellitus with hyperglycemia: Secondary | ICD-10-CM

## 2018-05-14 DIAGNOSIS — Z794 Long term (current) use of insulin: Secondary | ICD-10-CM | POA: Insufficient documentation

## 2018-05-14 LAB — POCT GLYCOSYLATED HEMOGLOBIN (HGB A1C): HbA1c, POC (controlled diabetic range): 7.4 % — AB (ref 0.0–7.0)

## 2018-05-14 LAB — GLUCOSE, POCT (MANUAL RESULT ENTRY): POC GLUCOSE: 172 mg/dL — AB (ref 70–99)

## 2018-05-14 MED ORDER — HYDROCHLOROTHIAZIDE 25 MG PO TABS: 25.0000 mg | ORAL_TABLET | Freq: Every day | ORAL | 2 refills | Status: DC

## 2018-05-14 NOTE — Progress Notes (Signed)
Patient ID: Breanna Obrien, female    DOB: 01/18/1956  MRN: [id number]  CC: Diabetes   Subjective: Breanna Obrien is a 62 y.o. female who presents for chronic disease management. 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  DM:  Checking BS 2-3 x a day.  Does not have log with her.  Range in a.m is 110-130 in a.m, after lunch 160-180, after dinner in the 170-180.   Doing okay with eating habits except over Thanksgiving.  "I was having a hard time over the holidays because every where you go people have sweet stuff." Walks 3 x a wk for 20-25 mins No blurred vision.  Due for eye exam.  She plans to wait until January.  No numbness in hands or feet Compliant with med:  Levimir, Farxiga, Metformin, glipizide  HTN:  Compliant with meds and salt restriction Checks BP 2-3 x a day.  Gives range 120-135/75-80 Denies any chest pains, shortness of breath, lower extremity edema   HL: Reports compliance with Lipitor.  Patient Active Problem List   Diagnosis Date Noted  . Abnormal mammogram 01/15/2017  . Coronary artery disease involving native coronary artery of native heart without angina pectoris 01/15/2017  . Hx of Bell's palsy 01/15/2017  . Insomnia due to medical condition 10/24/2016  . Depression 10/24/2016  . Type 2 diabetes mellitus with complication, without long-term current use of insulin ([place]) 10/06/2016  . Essential hypertension 10/06/2016  . Ataxia   . Hyperlipidemia   . Bilateral carotid artery stenosis   . CVA (cerebral vascular accident) ([place]) 10/05/2016     Current Outpatient Medications on File Prior to Visit  Medication Sig Dispense Refill  . amLODipine (NORVASC) 10 MG tablet Take 1 tablet (10 mg total) by mouth daily. 90 tablet 3  . atenolol (TENORMIN) 100 MG tablet Take 1 tablet (100 mg total) by mouth  every evening. 90 tablet 3  . atorvastatin (LIPITOR) 40 MG tablet Take 1 tablet (40 mg total) by mouth daily at 6 PM. 30 tablet 11  . calcium-vitamin D (OSCAL WITH D) 500-200 MG-UNIT tablet Take 1 tablet by mouth.    . cholecalciferol (VITAMIN D) 400 units TABS tablet Take 400 Units by mouth.    . clopidogrel (PLAVIX) 75 MG tablet TAKE 1 TABLET BY MOUTH ONCE DAILY 30 tablet 5  . co-enzyme Q-10 30 MG capsule Take 30 mg by mouth 3 (three) times daily.    . dapagliflozin propanediol (FARXIGA) 10 MG TABS tablet Take 10 mg by mouth daily. 30 tablet 11  . glipiZIDE (GLUCOTROL) 10 MG tablet TAKE 1 TABLET BY MOUTH TWICE DAILY BEFORE A MEAL 60 tablet 11  . insulin detemir (LEVEMIR) 100 UNIT/ML injection Inject 0.17 mLs (17 Units total) into the skin at bedtime. 10 mL 6  . Insulin Syringe-Needle U-100 (INSULIN SYRINGE .5CC/30GX1/2") 30G X 1/2" 0.5 ML MISC Use as directed 100 each 6  . metFORMIN (GLUCOPHAGE) 1000 MG tablet Take 1 tablet (1,000 mg total) by mouth 2 (two) times daily. 60 tablet 11  . niacin 500 MG tablet Take 500 mg by mouth at bedtime.    . pneumococcal 23 valent vaccine (PNEUMOVAX 23) 25 MCG/0.5ML injection To be administered by the pharmacist 0.5 mL 0  . valsartan (DIOVAN) 320 MG tablet Take 1 tablet (320 mg total) by mouth daily. 30 tablet 2  No current facility-administered medications on file prior to visit.     Allergies  Allergen Reactions  . Trazodone And Nefazodone     Makes her feel jittery    Social History   Socioeconomic History  . Marital status: Married    Spouse name: Not on file  . Number of children: Not on file  . Years of education: Not on file  . Highest education level: Not on file  Occupational History  . Not on file  Social Needs  . Financial resource strain: Not on file  . Food insecurity:    Worry: Not on file    Inability: Not on file  . Transportation needs:    Medical: Not on file    Non-medical: Not on file  Tobacco Use  . Smoking status:  Never Smoker  . Smokeless tobacco: Never Used  Substance and Sexual Activity  . Alcohol use: No  . Drug use: No  . Sexual activity: Yes    Birth control/protection: None  Lifestyle  . Physical activity:    Days per week: Not on file    Minutes per session: Not on file  . Stress: Not on file  Relationships  . Social connections:    Talks on phone: Not on file    Gets together: Not on file    Attends religious service: Not on file    Active member of club or organization: Not on file    Attends meetings of clubs or organizations: Not on file    Relationship status: Not on file  . Intimate partner violence:    Fear of current or ex partner: Not on file    Emotionally abused: Not on file    Physically abused: Not on file    Forced sexual activity: Not on file  Other Topics Concern  . Not on file  Social History Narrative  . Not on file    Family History  Problem Relation Age of Onset  . Cancer Mother     Past Surgical History:  Procedure Laterality Date  . CHOLECYSTECTOMY    . CORONARY ANGIOPLASTY WITH STENT PLACEMENT      ROS: Review of Systems Negative except as above.  PHYSICAL EXAM: BP 132/75   Pulse 73   Temp 97.7 F (36.5 C) (Oral)   Wt 183 lb 12.8 oz (83.4 kg)   SpO2 98%   BMI 32.56 kg/m   Wt Readings from Last 3 Encounters:  05/14/18 183 lb 12.8 oz (83.4 kg)  02/08/18 183 lb (83 kg)  11/01/17 183 lb (83 kg)    Physical Exam  General appearance - alert, well appearing, and in no distress Mental status - normal mood, behavior, speech, dress, motor activity, and thought processes Mouth - mucous membranes moist, pharynx normal without lesions Neck - supple, no significant adenopathy Chest - clear to auscultation, no wheezes, rales or rhonchi, symmetric air entry Heart - normal rate, regular rhythm, normal S1, S2, no murmurs, rubs, clicks or gallops Extremities - peripheral pulses normal, no pedal edema, no clubbing or cyanosis  Results for orders  placed or performed in visit on 05/14/18  POCT glucose (manual entry)  Result Value Ref Range   POC Glucose 172 (A) 70 - 99 mg/dl  POCT glycosylated hemoglobin (Hb A1C)  Result Value Ref Range   Hemoglobin A1C     HbA1c POC (<> result, manual entry)     HbA1c, POC (prediabetic range)     HbA1c, POC (controlled diabetic range) 7.4 (  A) 0.0 - 7.0 %    ASSESSMENT AND PLAN: 1. Type 2 diabetes mellitus with other circulatory complications (HCC) Y5W not at goal. Patient to work on limiting her portion sizes especially during the holiday season. Continue regular exercise that she has been doing. Continue current medications as mentioned above. - POCT glucose (manual entry) - POCT glycosylated hemoglobin (Hb A1C) - CBC - Comprehensive metabolic panel - Lipid panel  2. Essential hypertension Close to goal.  Continue current medications. - hydrochlorothiazide (HYDRODIURIL) 25 MG tablet; Take 1 tablet (25 mg total) by mouth daily.  Dispense: 90 tablet; Refill: 2  3. Other hyperlipidemia Continue Lipitor  4. Class 1 obesity due to excess calories with serious comorbidity and body mass index (BMI) of 32.0 to 32.9 in adult See #1 above.  Patient was given the opportunity to ask questions.  Patient verbalized understanding of the plan and was able to repeat key elements of the plan.   Orders Placed This Encounter  Procedures  . CBC  . Comprehensive metabolic panel  . Lipid panel  . POCT glucose (manual entry)  . POCT glycosylated hemoglobin (Hb A1C)     Requested Prescriptions   Signed Prescriptions Disp Refills  . hydrochlorothiazide (HYDRODIURIL) 25 MG tablet 90 tablet 2    Sig: Take 1 tablet (25 mg total) by mouth daily.    Return in about 3 months (around 08/13/2018).  Karle Plumber, MD, FACP

## 2018-05-14 NOTE — Patient Instructions (Signed)
Please remember to have your eye exam done. Work on limiting portion sizes during the holidays. Consider changing from white to wheat/whole-grain wheat bread.

## 2018-05-15 LAB — COMPREHENSIVE METABOLIC PANEL
ALK PHOS: 92 IU/L (ref 39–117)
ALT: 36 IU/L — ABNORMAL HIGH (ref 0–32)
AST: 26 IU/L (ref 0–40)
Albumin/Globulin Ratio: 1.6 (ref 1.2–2.2)
Albumin: 4.6 g/dL (ref 3.6–4.8)
BUN/Creatinine Ratio: 17 (ref 12–28)
BUN: 18 mg/dL (ref 8–27)
Bilirubin Total: 0.3 mg/dL (ref 0.0–1.2)
CO2: 23 mmol/L (ref 20–29)
CREATININE: 1.05 mg/dL — AB (ref 0.57–1.00)
Calcium: 10 mg/dL (ref 8.7–10.3)
Chloride: 97 mmol/L (ref 96–106)
GFR calc Af Amer: 66 mL/min/{1.73_m2} (ref >59)
GFR calc non Af Amer: 57 mL/min/{1.73_m2} — ABNORMAL LOW (ref >59)
GLUCOSE: 168 mg/dL — AB (ref 65–99)
Globulin, Total: 2.8 g/dL (ref 1.5–4.5)
Potassium: 4.2 mmol/L (ref 3.5–5.2)
Sodium: 138 mmol/L (ref 134–144)
Total Protein: 7.4 g/dL (ref 6.0–8.5)

## 2018-05-15 LAB — LIPID PANEL
CHOLESTEROL TOTAL: 121 mg/dL (ref 100–199)
Chol/HDL Ratio: 3.1 ratio (ref 0.0–4.4)
HDL: 39 mg/dL — ABNORMAL LOW (ref >39)
LDL CALC: 58 mg/dL (ref 0–99)
TRIGLYCERIDES: 120 mg/dL (ref 0–149)
VLDL CHOLESTEROL CAL: 24 mg/dL (ref 5–40)

## 2018-05-15 LAB — CBC
HEMOGLOBIN: 12.2 g/dL (ref 11.1–15.9)
Hematocrit: 36.4 % (ref 34.0–46.6)
MCH: 30.2 pg (ref 26.6–33.0)
MCHC: 33.5 g/dL (ref 31.5–35.7)
MCV: 90 fL (ref 79–97)
Platelets: 351 10*3/uL (ref 150–450)
RBC: 4.04 x10E6/uL (ref 3.77–5.28)
RDW: 13.1 % (ref 12.3–15.4)
WBC: 7.9 10*3/uL (ref 3.4–10.8)

## 2018-06-03 ENCOUNTER — Other Ambulatory Visit: Payer: Self-pay | Admitting: Internal Medicine

## 2018-06-03 MED FILL — ?ATENOLOL 100MG TABLET: 100 | 30 days supply | Qty: 30 | Fill #3

## 2018-06-03 MED FILL — ?GLIPIZIDE 10 MG TABLET: 10 | 30 days supply | Qty: 60 | Fill #3

## 2018-06-03 MED FILL — CLOPIDOGREL 75 MG TABLET: 75 | 30 days supply | Qty: 30 | Fill #1

## 2018-06-03 MED FILL — HYDROCHLOROTHIAZIDE 25 MG T: 25 | 30 days supply | Qty: 30 | Fill #0

## 2018-06-03 MED FILL — AMLODIPINE BESYLATE 10 MG T: 10 | 30 days supply | Qty: 30 | Fill #3

## 2018-06-03 MED FILL — ?METFORMIN HCL 1000MG TABS: 1000 | 30 days supply | Qty: 60 | Fill #2

## 2018-06-03 MED FILL — ?ATORVASTATIN 40MG TABLET: 40 | 30 days supply | Qty: 30 | Fill #3

## 2018-06-04 ENCOUNTER — Other Ambulatory Visit: Payer: Self-pay

## 2018-06-04 MED ORDER — VALSARTAN 160 MG PO TABS: 320.0000 mg | ORAL_TABLET | Freq: Every day | ORAL | 2 refills | Status: DC

## 2018-06-04 MED FILL — VALSARTAN 160 MG TABLET: 160 | 30 days supply | Qty: 60 | Fill #0

## 2018-06-25 ENCOUNTER — Ambulatory Visit
Admission: RE | Admit: 2018-06-25 | Discharge: 2018-06-25 | Disposition: A | Discharge status: Home / Self Care | Payer: No Typology Code available for payment source | Source: Ambulatory Visit | Attending: Obstetrics and Gynecology | Admitting: Obstetrics and Gynecology

## 2018-06-25 DIAGNOSIS — R921 Mammographic calcification found on diagnostic imaging of breast: Secondary | ICD-10-CM

## 2018-07-02 MED FILL — FARXIGA 10 MG TABLET: 10 | 30 days supply | Qty: 30 | Fill #1

## 2018-07-02 MED FILL — VALSARTAN 160 MG TABLET: 160 | 30 days supply | Qty: 60 | Fill #1

## 2018-07-02 MED FILL — AMLODIPINE BESYLATE 10 MG T: 10 | 30 days supply | Qty: 30 | Fill #4

## 2018-07-02 MED FILL — ?ATENOLOL 100MG TABLET: 100 | 30 days supply | Qty: 30 | Fill #4

## 2018-07-02 MED FILL — ?METFORMIN HCL 1000MG TABS: 1000 | 30 days supply | Qty: 60 | Fill #3

## 2018-07-02 MED FILL — ?GLIPIZIDE 10 MG TABLET: 10 | 30 days supply | Qty: 60 | Fill #4

## 2018-07-02 MED FILL — HYDROCHLOROTHIAZIDE 25 MG T: 25 | 30 days supply | Qty: 30 | Fill #1

## 2018-07-02 MED FILL — CLOPIDOGREL 75 MG TABLET: 75 | 30 days supply | Qty: 30 | Fill #2

## 2018-07-19 ENCOUNTER — Ambulatory Visit: Payer: Self-pay | Attending: Internal Medicine

## 2018-07-30 MED FILL — ?METFORMIN HCL 1000 MG TAB: 1000 | 30 days supply | Qty: 60 | Fill #4

## 2018-07-30 MED FILL — ?GLIPIZIDE 10 MG TABLET: 10 | 30 days supply | Qty: 60 | Fill #5

## 2018-07-30 MED FILL — AMLODIPINE BESYLATE 10 MG T: 10 | 30 days supply | Qty: 30 | Fill #5

## 2018-07-30 MED FILL — CLOPIDOGREL 75 MG TABLET: 75 | 30 days supply | Qty: 30 | Fill #3

## 2018-07-30 MED FILL — ?ATENOLOL 100MG TABLET: 100 | 30 days supply | Qty: 30 | Fill #5

## 2018-07-30 MED FILL — HYDROCHLOROTHIAZIDE 25 MG T: 25 | 30 days supply | Qty: 30 | Fill #2

## 2018-07-30 MED FILL — VALSARTAN 160 MG TABLET: 160 | 30 days supply | Qty: 60 | Fill #2

## 2018-07-30 MED FILL — FARXIGA 10 MG TABLET: 10 | 30 days supply | Qty: 30 | Fill #2

## 2018-08-26 ENCOUNTER — Other Ambulatory Visit: Payer: Self-pay | Admitting: Internal Medicine

## 2018-08-26 MED FILL — HYDROCHLOROTHIAZIDE 25 MG T: 25 | 30 days supply | Qty: 30 | Fill #3

## 2018-08-26 MED FILL — ?ATORVASTATIN 40MG TABLET: 40 | 30 days supply | Qty: 30 | Fill #4

## 2018-08-26 MED FILL — ?METFORMIN HCL 1000 MG TAB: 1000 | 30 days supply | Qty: 60 | Fill #5

## 2018-08-26 MED FILL — ?ATENOLOL 100MG TABLET: 100 | 30 days supply | Qty: 30 | Fill #6

## 2018-08-26 MED FILL — AMLODIPINE BESYLATE 10 MG T: 10 | 30 days supply | Qty: 30 | Fill #6

## 2018-08-26 MED FILL — CLOPIDOGREL 75 MG TABLET: 75 | 30 days supply | Qty: 30 | Fill #4

## 2018-08-27 ENCOUNTER — Ambulatory Visit: Payer: Medicaid Other | Admitting: Internal Medicine

## 2018-08-27 ENCOUNTER — Telehealth: Payer: Self-pay | Admitting: Pharmacist

## 2018-08-27 MED ORDER — LOSARTAN POTASSIUM 100 MG PO TABS: 100.0000 mg | ORAL_TABLET | Freq: Every day | ORAL | 3 refills | Status: DC

## 2018-08-27 NOTE — Addendum Note (Signed)
Addended by: Karle Plumber B on: 08/27/2018 05:25 PM   Modules accepted: Orders

## 2018-08-27 NOTE — Telephone Encounter (Signed)
Patient's valsartan is on backorder, Irbesartan is as well. Received notice from pharmacy. Will route to PCP to determine therapeutic equivalent. Was informed that we can obtain losartan; I recommend to switch to losartan.

## 2018-08-28 MED FILL — LOSARTAN POTASSIUM 100 MG T: 100 | 90 days supply | Qty: 90 | Fill #0

## 2018-08-28 NOTE — Telephone Encounter (Signed)
Contacted pt and made aware of Dr. Johnson message pt doesn't have any questions or concerns  

## 2018-08-29 ENCOUNTER — Ambulatory Visit: Payer: Medicaid Other | Admitting: Internal Medicine

## 2018-09-02 MED FILL — ?GLIPIZIDE 10 MG TABLET: 10 | 90 days supply | Qty: 180 | Fill #6

## 2018-09-09 MED FILL — $LEVEMIR 100U/ML VIAL: 100 | 90 days supply | Qty: 30 | Fill #2

## 2018-09-12 ENCOUNTER — Ambulatory Visit: Payer: Medicaid Other | Admitting: Internal Medicine

## 2018-09-12 ENCOUNTER — Ambulatory Visit: Payer: Self-pay | Attending: Internal Medicine | Admitting: Internal Medicine

## 2018-09-12 ENCOUNTER — Encounter: Payer: Self-pay | Admitting: Internal Medicine

## 2018-09-12 ENCOUNTER — Other Ambulatory Visit: Payer: Self-pay

## 2018-09-12 DIAGNOSIS — M25571 Pain in right ankle and joints of right foot: Secondary | ICD-10-CM

## 2018-09-12 DIAGNOSIS — Z794 Long term (current) use of insulin: Secondary | ICD-10-CM

## 2018-09-12 DIAGNOSIS — E1169 Type 2 diabetes mellitus with other specified complication: Secondary | ICD-10-CM

## 2018-09-12 DIAGNOSIS — E1159 Type 2 diabetes mellitus with other circulatory complications: Secondary | ICD-10-CM

## 2018-09-12 DIAGNOSIS — E785 Hyperlipidemia, unspecified: Secondary | ICD-10-CM

## 2018-09-12 DIAGNOSIS — I10 Essential (primary) hypertension: Secondary | ICD-10-CM

## 2018-09-12 DIAGNOSIS — G8929 Other chronic pain: Secondary | ICD-10-CM

## 2018-09-12 DIAGNOSIS — I2510 Atherosclerotic heart disease of native coronary artery without angina pectoris: Secondary | ICD-10-CM

## 2018-09-12 MED ORDER — DAPAGLIFLOZIN PROPANEDIOL 10 MG PO TABS: 10.0000 mg | ORAL_TABLET | Freq: Every day | ORAL | 6 refills | Status: DC

## 2018-09-12 MED ORDER — INSULIN DETEMIR 100 UNIT/ML ~~LOC~~ SOLN: 17.0000 [IU] | Freq: Every day | SUBCUTANEOUS | 6 refills | Status: DC

## 2018-09-12 MED ORDER — AMLODIPINE BESYLATE 10 MG PO TABS: 10.0000 mg | ORAL_TABLET | Freq: Every day | ORAL | 3 refills | Status: DC

## 2018-09-12 MED ORDER — GLIPIZIDE 10 MG PO TABS: ORAL_TABLET | ORAL | 6 refills | Status: DC

## 2018-09-12 MED ORDER — ATORVASTATIN CALCIUM 40 MG PO TABS: 40.0000 mg | ORAL_TABLET | Freq: Every day | ORAL | 11 refills | Status: DC

## 2018-09-12 MED ORDER — METFORMIN HCL 1000 MG PO TABS: 1000.0000 mg | ORAL_TABLET | Freq: Two times a day (BID) | ORAL | 6 refills | Status: DC

## 2018-09-12 MED ORDER — ATENOLOL 100 MG PO TABS: 100.0000 mg | ORAL_TABLET | Freq: Every evening | ORAL | 3 refills | Status: DC

## 2018-09-12 MED ORDER — CLOPIDOGREL BISULFATE 75 MG PO TABS: 75.0000 mg | ORAL_TABLET | Freq: Every day | ORAL | 6 refills | Status: DC

## 2018-09-12 MED ORDER — HYDROCHLOROTHIAZIDE 25 MG PO TABS: 25.0000 mg | ORAL_TABLET | Freq: Every day | ORAL | 2 refills | Status: DC

## 2018-09-12 MED ORDER — LOSARTAN POTASSIUM 100 MG PO TABS: 100.0000 mg | ORAL_TABLET | Freq: Every day | ORAL | 3 refills | Status: DC

## 2018-09-12 NOTE — Progress Notes (Signed)
Virtual Visit via Telephone Note  I connected with Breanna Obrien on 09/12/18 at 9:32 a.m by telephone  from my office and verified that I am speaking with the correct person using two identifiers.  Pt is at home.  Only the patient and myself participated in this encounter.   I discussed the limitations, risks, security and privacy concerns of performing an evaluation and management service by telephone and the availability of in person appointments. I also discussed with the patient that there may be a patient responsible charge related to this service. The patient expressed understanding and agreed to proceed.   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  Last seen 04/2018  DM:  Last A1C 7.4 in 04/2018 Checking BS 2-4 x a day. Gives a.m range of 83-103, 2 hrs post BF 150-189 Meds: compliant with Levemir, Farxiga, Metformin and Glipizide Eating habits: doing well with eating habits.  Uses sugar free creamer and Splenda Exercise: walks 1/2 a mile a day.  Can not walk more than 1/2 mile because she gets pain in RT foot near the ankle. Has notice this for 1 yr.  Sprained the ankle many yrs ago.  No swelling.   HTN/CAD: checks BP daily.  This a.m was 132/78, yesterday 126/81, 132/72, 116/69 Limits salt in foods No CP/SOB/PND/HA Compliant with meds including Plavix and Lipitor.  HL: Reports compliance with an tolerating Lipitor  HM: she has not had eye exam done as yet.  FIT test done 11/2017 was negative   Observations/Objective:  Reported BP reading this a.m was 132/78  Lab Results  Component Value Date   CHOL 121 05/14/2018   HDL 39 (L) 05/14/2018   LDLCALC 58 05/14/2018   TRIG 120 05/14/2018   CHOLHDL 3.1 05/14/2018     Chemistry      Component Value Date/Time   NA 138 05/14/2018 0925   K 4.2 05/14/2018 0925   CL 97  05/14/2018 0925   CO2 23 05/14/2018 0925   BUN 18 05/14/2018 0925   CREATININE 1.05 (H) 05/14/2018 0925      Component Value Date/Time   CALCIUM 10.0 05/14/2018 0925   ALKPHOS 92 05/14/2018 0925   AST 26 05/14/2018 0925   ALT 36 (H) 05/14/2018 0925   BILITOT 0.3 05/14/2018 0925     Lab Results  Component Value Date   CHOL 121 05/14/2018   HDL 39 (L) 05/14/2018   LDLCALC 58 05/14/2018   TRIG 120 05/14/2018   CHOLHDL 3.1 05/14/2018    Assessment and Plan: 1. Controlled type 2 diabetes mellitus with other circulatory complication, with long-term current use of insulin (HCC) Blood sugar readings for the most part are within targeted range.  I commended her on this. Encouraged her to continue healthy eating habits and regular exercise.  No changes made in medications at this time. Discussed the importance of the annual diabetic eye exam.  Patient plans to have it done once the COVID-19 pandemic is over. - dapagliflozin propanediol (FARXIGA) 10 MG TABS tablet; Take 10 mg by mouth daily.  Dispense: 30 tablet; Refill: 6 - glipiZIDE (GLUCOTROL) 10 MG tablet; TAKE 1 TABLET BY MOUTH TWICE DAILY BEFORE A MEAL  Dispense: 60 tablet; Refill: 6 - insulin detemir (LEVEMIR) 100 UNIT/ML injection; Inject 0.17 mLs (17 Units total) into the skin at bedtime.  Dispense: 10 mL; Refill: 6 -  metFORMIN (GLUCOPHAGE) 1000 MG tablet; Take 1 tablet (1,000 mg total) by mouth 2 (two) times daily.  Dispense: 60 tablet; Refill: 6  2. Essential hypertension Blood pressure readings are at goal and some are close to goal.  Goal is 130/80 or lower.  Encouraged her to continue limiting salt in the foods. - amLODipine (NORVASC) 10 MG tablet; Take 1 tablet (10 mg total) by mouth daily.  Dispense: 90 tablet; Refill: 3 - atenolol (TENORMIN) 100 MG tablet; Take 1 tablet (100 mg total) by mouth every evening.  Dispense: 90 tablet; Refill: 3 - hydrochlorothiazide (HYDRODIURIL) 25 MG tablet; Take 1 tablet (25 mg total) by mouth  daily.  Dispense: 90 tablet; Refill: 2 - losartan (COZAAR) 100 MG tablet; Take 1 tablet (100 mg total) by mouth daily.  Dispense: 90 tablet; Refill: 3  3. Hyperlipidemia associated with type 2 diabetes mellitus (HCC) - atorvastatin (LIPITOR) 40 MG tablet; Take 1 tablet (40 mg total) by mouth daily at 6 PM.  Dispense: 30 tablet; Refill: 11  4. Coronary artery disease involving native coronary artery of native heart without angina pectoris Clinically stable without symptoms at this time. - atorvastatin (LIPITOR) 40 MG tablet; Take 1 tablet (40 mg total) by mouth daily at 6 PM.  Dispense: 30 tablet; Refill: 11 - clopidogrel (PLAVIX) 75 MG tablet; Take 1 tablet (75 mg total) by mouth daily.  Dispense: 30 tablet; Refill: 6  5. Chronic pain of right ankle Advise use of Tylenol as needed.  We will get an x-ray since this has been a chronic issue.  Further management will be based on results.  I did give patient the option of coming in to have it evaluated in person but she wanted to hold off at this time. - DG Ankle Complete Right; Future   Follow Up Instructions: F/u in 3 mths   I discussed the assessment and treatment plan with the patient. The patient was provided an opportunity to ask questions and all were answered. The patient agreed with the plan and demonstrated an understanding of the instructions.   The patient was advised to call back or seek an in-person evaluation if the symptoms worsen or if the condition fails to improve as anticipated.  I provided 19 minutes of non-face-to-face time during this encounter.   Karle Plumber, MD

## 2018-09-27 MED FILL — ?METFORMIN HCL 1000 MG TAB: 1000 | 30 days supply | Qty: 60 | Fill #6

## 2018-09-27 MED FILL — CLOPIDOGREL 75 MG TABLET: 75 | 30 days supply | Qty: 30 | Fill #5

## 2018-09-27 MED FILL — ?ATORVASTATIN 40MG TABLET: 40 | 30 days supply | Qty: 30 | Fill #5

## 2018-09-27 MED FILL — ATENOLOL 100 MG TABLET: 100 | 30 days supply | Qty: 30 | Fill #7

## 2018-09-27 MED FILL — ?AMLODIPINE BESYLATE 10 MG: 10 | 30 days supply | Qty: 30 | Fill #7

## 2018-09-27 MED FILL — HYDROCHLOROTHIAZIDE 25 MG T: 25 | 30 days supply | Qty: 30 | Fill #4

## 2018-11-05 MED FILL — CLOPIDOGREL 75 MG TABLET: 75 | 30 days supply | Qty: 30 | Fill #0

## 2018-11-05 MED FILL — metFORMIN HCL 1000 MG TABS: 1000 | 30 days supply | Qty: 60 | Fill #7

## 2018-11-05 MED FILL — ?HYDROCHLOROTHIAZIDE 25MG T: 25 | 30 days supply | Qty: 30 | Fill #5

## 2018-11-05 MED FILL — ?AMLODIPINE BESYLATE 10 MG: 10 | 30 days supply | Qty: 30 | Fill #8

## 2018-11-26 MED FILL — ATENOLOL 100 MG TABLET: 100 | 30 days supply | Qty: 30 | Fill #8

## 2018-11-26 MED FILL — ?GLIPIZIDE 10 MG TABLET: 10 | 90 days supply | Qty: 180 | Fill #7

## 2018-12-23 MED FILL — CLOPIDOGREL 75 MG TABLET: 75 | 30 days supply | Qty: 30 | Fill #1

## 2018-12-23 MED FILL — ?HYDROCHLOROTHIAZIDE 25MG T: 25 | 30 days supply | Qty: 30 | Fill #6

## 2018-12-23 MED FILL — ?ATORVASTATIN 40MG TABLET: 40 | 30 days supply | Qty: 30 | Fill #6

## 2018-12-23 MED FILL — ATENOLOL 100 MG TAB: 100 | 30 days supply | Qty: 30 | Fill #9

## 2018-12-23 MED FILL — metFORMIN HCL 1000 MG TABS: 1000 | 30 days supply | Qty: 60 | Fill #8

## 2018-12-23 MED FILL — ?AMLODIPINE BESYLATE 10 MG: 10 | 30 days supply | Qty: 30 | Fill #9

## 2018-12-26 ENCOUNTER — Encounter: Payer: Self-pay | Admitting: Internal Medicine

## 2018-12-26 ENCOUNTER — Telehealth: Payer: Self-pay | Admitting: Internal Medicine

## 2018-12-26 ENCOUNTER — Other Ambulatory Visit: Payer: Self-pay

## 2018-12-26 ENCOUNTER — Ambulatory Visit: Payer: Self-pay | Attending: Internal Medicine | Admitting: Internal Medicine

## 2018-12-26 DIAGNOSIS — M25571 Pain in right ankle and joints of right foot: Secondary | ICD-10-CM

## 2018-12-26 DIAGNOSIS — IMO0002 Reserved for concepts with insufficient information to code with codable children: Secondary | ICD-10-CM

## 2018-12-26 DIAGNOSIS — Z1211 Encounter for screening for malignant neoplasm of colon: Secondary | ICD-10-CM

## 2018-12-26 DIAGNOSIS — I2510 Atherosclerotic heart disease of native coronary artery without angina pectoris: Secondary | ICD-10-CM

## 2018-12-26 DIAGNOSIS — R05 Cough: Secondary | ICD-10-CM

## 2018-12-26 DIAGNOSIS — E1165 Type 2 diabetes mellitus with hyperglycemia: Secondary | ICD-10-CM

## 2018-12-26 DIAGNOSIS — E1159 Type 2 diabetes mellitus with other circulatory complications: Secondary | ICD-10-CM

## 2018-12-26 DIAGNOSIS — E1169 Type 2 diabetes mellitus with other specified complication: Secondary | ICD-10-CM

## 2018-12-26 DIAGNOSIS — E785 Hyperlipidemia, unspecified: Secondary | ICD-10-CM

## 2018-12-26 DIAGNOSIS — G8929 Other chronic pain: Secondary | ICD-10-CM

## 2018-12-26 DIAGNOSIS — R058 Other specified cough: Secondary | ICD-10-CM

## 2018-12-26 DIAGNOSIS — I10 Essential (primary) hypertension: Secondary | ICD-10-CM

## 2018-12-26 DIAGNOSIS — Z794 Long term (current) use of insulin: Secondary | ICD-10-CM

## 2018-12-26 MED ORDER — INSULIN DETEMIR 100 UNIT/ML ~~LOC~~ SOLN: 20.0000 [IU] | Freq: Every day | SUBCUTANEOUS | 6 refills | Status: DC

## 2018-12-26 MED ORDER — LORATADINE 10 MG PO TABS: 10.0000 mg | ORAL_TABLET | Freq: Every day | ORAL | 0 refills | Status: DC

## 2018-12-26 MED ORDER — FLUTICASONE PROPIONATE 50 MCG/ACT NA SUSP: 1.0000 | Freq: Every day | NASAL | 0 refills | Status: DC | PRN

## 2018-12-26 MED FILL — FLUTICASONE PROP 50 MCG SPR: 50 | 25 days supply | Qty: 16 | Fill #0

## 2018-12-26 MED FILL — ?LORATADINE 10 MG TABS: 10 | 30 days supply | Qty: 30 | Fill #0

## 2018-12-26 MED FILL — !LEVEMIR 100 UNITS/ML VIAL: 100/ML | 50 days supply | Qty: 10 | Fill #0

## 2018-12-26 NOTE — Telephone Encounter (Signed)
Attempted to reach pt; LVM informing to call back so we sch a appt

## 2018-12-26 NOTE — Progress Notes (Signed)
Virtual Visit via Telephone Note Due to current restrictions/limitations of in-office visits due to the COVID-19 pandemic, this scheduled clinical appointment was converted to a telehealth visit  I connected with Breanna Obrien on 12/26/18 at 8.40 a.m EDT by telephone and verified that I am speaking with the correct person using two identifiers. I am in my office.  The patient is at home.  Only the patient and myself participated in this encounter.  I discussed the limitations, risks, security and privacy concerns of performing an evaluation and management service by telephone and the availability of in person appointments. I also discussed with the patient that there may be a patient responsible charge related to this service. The patient expressed understanding and agreed to proceed.   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  Last eval 08/2018   Has a dry and sometimes productive cough for about 2 wks.  Little better over past few days -had negative COVID test recently -thinks it is her sinuses because she wakes up in the morning "with a headache at the bridge of my nose."  +Nasal congestion.  Yellow mucus when she blows nose in mornings.  Itchy throat sometimes.  Post-nasal drip initially but that has resolved. Taking Benadryl at nights.    Still having discomfort around RT ankle for over 1 yr.  Feels it is getting worse.  Does a lot of standing at work.  Hurts with walking, sitting and first thing in the mornings. + swelling "only when I'm on my feet a lot."  DM:  "not good." Checks BS TID.  Gives a.m range of 130-180.  This morning was 132;  after BF 200-220, after dinner 138-165 -Compliant with meds listed below Eating Habits:  "not good."  She was not working for a while after COVID pandemic.  Doing better now that she is back  at work for past 1 mth Exercise:  Not much due to RT ankle issue No blurred vision.  Not able to afford the eye exam   HYPERTENSION/CAD Currently taking: see medication list Med Adherence: [x]  Yes    []  No Medication side effects: []  Yes    [x]  No Adherence with salt restriction: [x]  Yes    []  No Home Monitoring?: [x]  Yes    []  No Monitoring Frequency: daily Home BP results range: 126/72 this a.m. Other readings in similar range SOB? []  Yes    [x]  No Chest Pain?: []  Yes    [x]  No Leg swelling?: []  Yes    [x]  No Headaches?: []  Yes    [x]  No Dizziness? []  Yes    [x]  No Comments:  No PND/orthopnea    Outpatient Encounter Medications as of 12/26/2018  Medication Sig  . amLODipine (NORVASC) 10 MG tablet Take 1 tablet (10 mg total) by mouth daily.  Marland Kitchen atenolol (TENORMIN) 100 MG tablet Take 1 tablet (100 mg total) by mouth every evening.  Marland Kitchen atorvastatin (LIPITOR) 40 MG tablet Take 1 tablet (40 mg total) by mouth daily at 6 PM.  . clopidogrel (PLAVIX) 75 MG tablet Take 1 tablet (75 mg total) by mouth daily.  . dapagliflozin propanediol (FARXIGA) 10 MG TABS tablet Take 10 mg by mouth daily.  Marland Kitchen glipiZIDE (GLUCOTROL) 10 MG tablet TAKE 1 TABLET BY MOUTH TWICE DAILY BEFORE A MEAL  . hydrochlorothiazide (HYDRODIURIL) 25 MG tablet Take 1 tablet (  25 mg total) by mouth daily.  . insulin detemir (LEVEMIR) 100 UNIT/ML injection Inject 0.17 mLs (17 Units total) into the skin at bedtime.  . Insulin Syringe-Needle U-100 (INSULIN SYRINGE .5CC/30GX1/2") 30G X 1/2" 0.5 ML MISC Use as directed  . losartan (COZAAR) 100 MG tablet Take 1 tablet (100 mg total) by mouth daily.  . metFORMIN (GLUCOPHAGE) 1000 MG tablet Take 1 tablet (1,000 mg total) by mouth 2 (two) times daily.  . pneumococcal 23 valent vaccine (PNEUMOVAX 23) 25 MCG/0.5ML injection To be administered by the pharmacist  . calcium-vitamin D (OSCAL WITH D) 500-200 MG-UNIT tablet Take 1 tablet by mouth.   No facility-administered encounter medications  on file as of 12/26/2018.     Observations/Objective: No direct observation done as this was a tele visit  Assessment and Plan: 1. Uncontrolled type 2 diabetes mellitus with circulatory disorder, with long-term current use of insulin ([place]) Dietary counseling given.  Increase Levemir from 17 units daily now to 20 units daily. - Hemoglobin A1c; Future - insulin detemir (LEVEMIR) 100 UNIT/ML injection; Inject 0.2 mLs (20 Units total) into the skin at bedtime.  Dispense: 10 mL; Refill: 6  2. Essential hypertension Reported blood pressure readings are at goal.  Continue current medications  3. Coronary artery disease involving native coronary artery of native heart without angina pectoris Clinically stable.  Continue Lipitor, Plavix, atenolol  4. Chronic pain of right ankle We will get an x-ray of this ankle.  In the meantime recommend using Tylenol as needed - DG Ankle Complete Right; Future  5. Hyperlipidemia associated with type 2 diabetes mellitus (HCC) Continue Lipitor  6. Upper airway cough syndrome Stop Benadryl - loratadine (CLARITIN) 10 MG tablet; Take 1 tablet (10 mg total) by mouth daily.  Dispense: 30 tablet; Refill: 0 - fluticasone (FLONASE) 50 MCG/ACT nasal spray; Place 1 spray into both nostrils daily as needed for allergies or rhinitis.  Dispense: 16 g; Refill: 0  7. Screening for colon cancer - Fecal occult blood, imunochemical([facility]); Future   Follow Up Instructions: 3 mths   I discussed the assessment and treatment plan with the patient. The patient was provided an opportunity to ask questions and all were answered. The patient agreed with the plan and demonstrated an understanding of the instructions.   The patient was advised to call back or seek an in-person evaluation if the symptoms worsen or if the condition fails to improve as anticipated.  I provided 19 minutes of non-face-to-face time during this encounter.   Karle Plumber, MD

## 2018-12-26 NOTE — Telephone Encounter (Signed)
-----   Message from Jackelyn Knife, [place] sent at 12/26/2018  1:49 PM EDT ----- Please contact Breanna Obrien and schedule a 3 month in person visit(Morning time), a lab appointment

## 2018-12-26 NOTE — Progress Notes (Signed)
Follow up for DM and med refills  CBG at home this morning was 132

## 2018-12-27 ENCOUNTER — Ambulatory Visit: Payer: Self-pay | Attending: Internal Medicine

## 2018-12-27 DIAGNOSIS — E1159 Type 2 diabetes mellitus with other circulatory complications: Secondary | ICD-10-CM

## 2018-12-27 DIAGNOSIS — Z1211 Encounter for screening for malignant neoplasm of colon: Secondary | ICD-10-CM

## 2018-12-27 DIAGNOSIS — IMO0002 Reserved for concepts with insufficient information to code with codable children: Secondary | ICD-10-CM

## 2018-12-28 LAB — HEMOGLOBIN A1C
Est. average glucose Bld gHb Est-mCnc: 160 mg/dL
Hgb A1c MFr Bld: 7.2 % — ABNORMAL HIGH (ref 4.8–5.6)

## 2019-01-17 ENCOUNTER — Other Ambulatory Visit: Payer: Self-pay

## 2019-01-17 ENCOUNTER — Ambulatory Visit: Payer: Self-pay | Attending: Internal Medicine

## 2019-01-27 MED FILL — ?ATORVASTATIN 40MG TABLET: 40 | 30 days supply | Qty: 30 | Fill #7

## 2019-01-27 MED FILL — ?ATENOLOL 100MG TABLET: 100 | 30 days supply | Qty: 30 | Fill #10

## 2019-01-27 MED FILL — ?HYDROCHLOROTHIAZIDE 25MG T: 25 | 30 days supply | Qty: 30 | Fill #7

## 2019-01-27 MED FILL — CLOPIDOGREL 75 MG TABLET: 75 | 30 days supply | Qty: 30 | Fill #2

## 2019-01-27 MED FILL — ?AMLODIPINE BESYLATE 10 MG: 10 | 30 days supply | Qty: 30 | Fill #10

## 2019-01-27 MED FILL — metFORMIN HCL 1000 MG TABS: 1000 | 30 days supply | Qty: 60 | Fill #9

## 2019-02-24 MED FILL — ?AMLODIPINE BESYLATE 10 MG: 10 | 90 days supply | Qty: 90 | Fill #0

## 2019-02-24 MED FILL — CLOPIDOGREL 75 MG TABLET: 75 | 30 days supply | Qty: 30 | Fill #3

## 2019-02-24 MED FILL — !LEVEMIR 100 UNITS/ML VIAL: 100/ML | 42 days supply | Qty: 10 | Fill #1

## 2019-02-24 MED FILL — ?metFORMIN HCL 1000MG TABL: 1000 | 30 days supply | Qty: 60 | Fill #0

## 2019-02-24 MED FILL — LOSARTAN POTASSIUM 100 MG T: 100 | 90 days supply | Qty: 90 | Fill #1

## 2019-02-24 MED FILL — ?GLIPIZIDE 10 MG TABLET: 10 | 30 days supply | Qty: 60 | Fill #0

## 2019-02-24 MED FILL — ?HYDROCHLOROTHIAZIDE 25MG T: 25 | 30 days supply | Qty: 30 | Fill #8

## 2019-03-04 IMAGING — MG DIGITAL DIAGNOSTIC BILATERAL MAMMOGRAM WITH TOMO AND CAD
8 of 12 series · 8 of 28 positions shown · non-contrast
Comparison: Previous exam(s).

CLINICAL DATA: Final follow-up for probably benign calcifications
in the bilateral breasts.

EXAM:
DIGITAL DIAGNOSTIC BILATERAL MAMMOGRAM WITH CAD AND TOMO

[R CC]
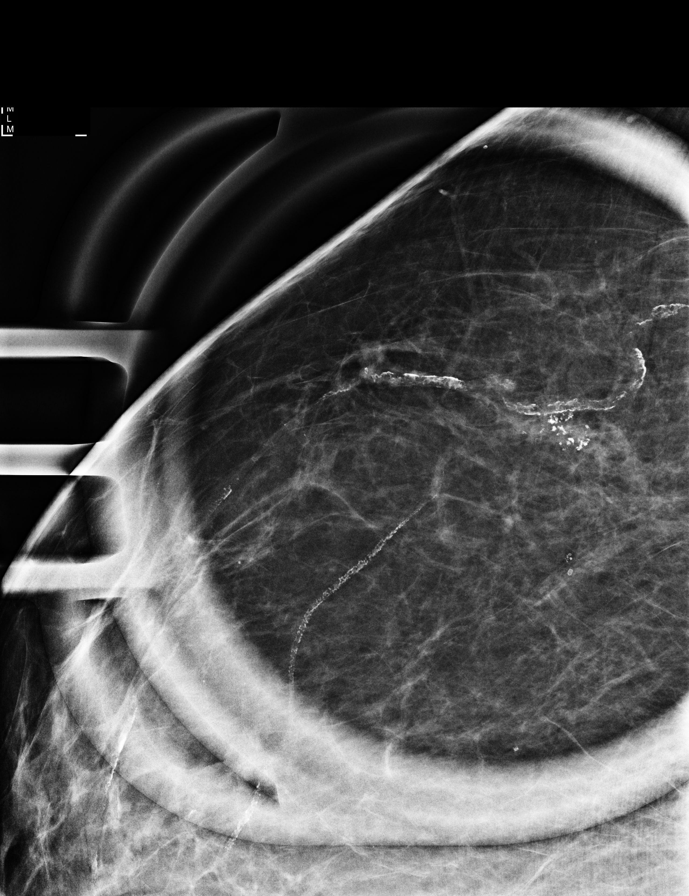

[R ML]
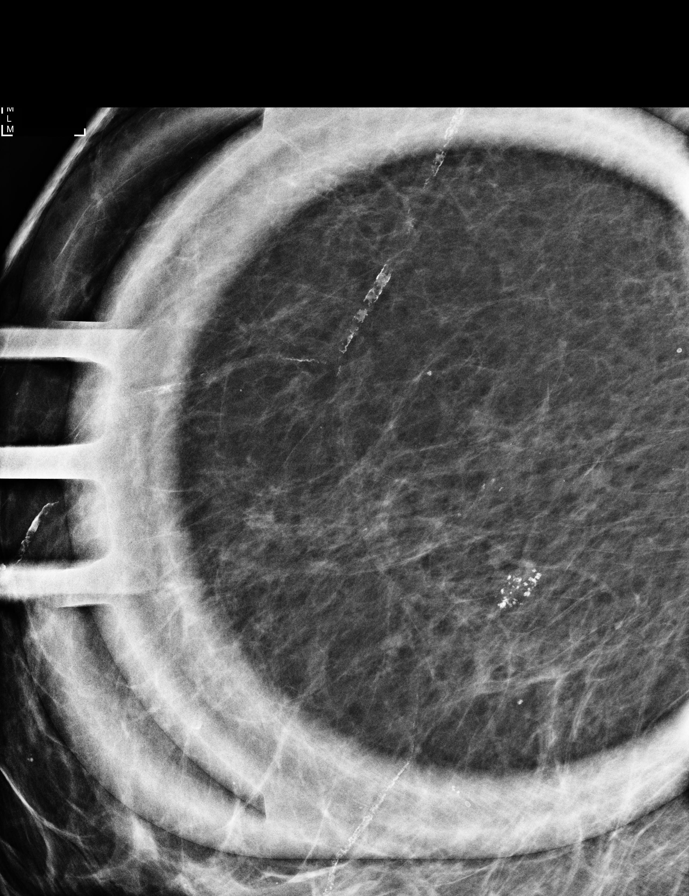

[L CC]
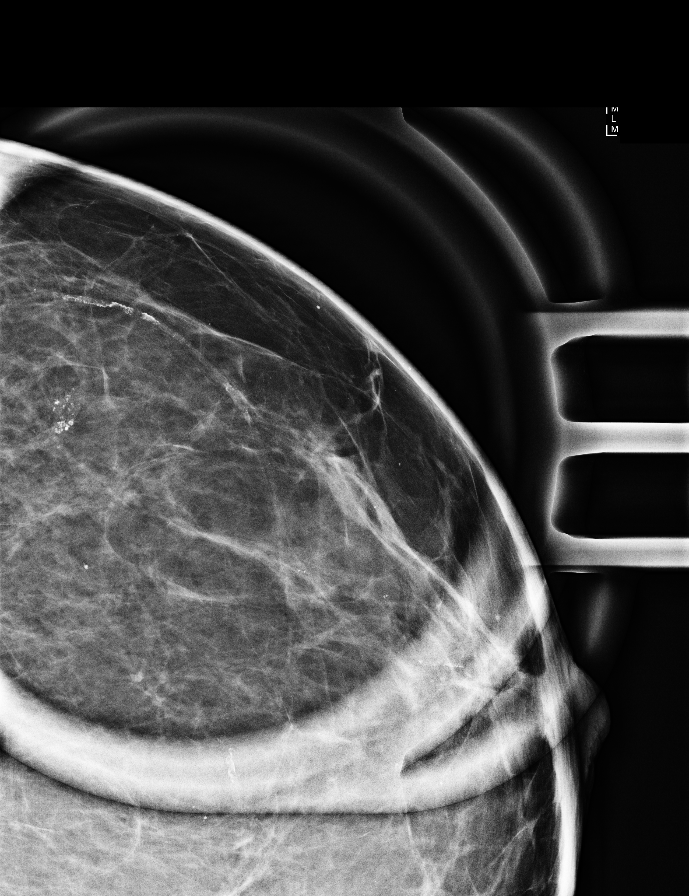

[L ML]
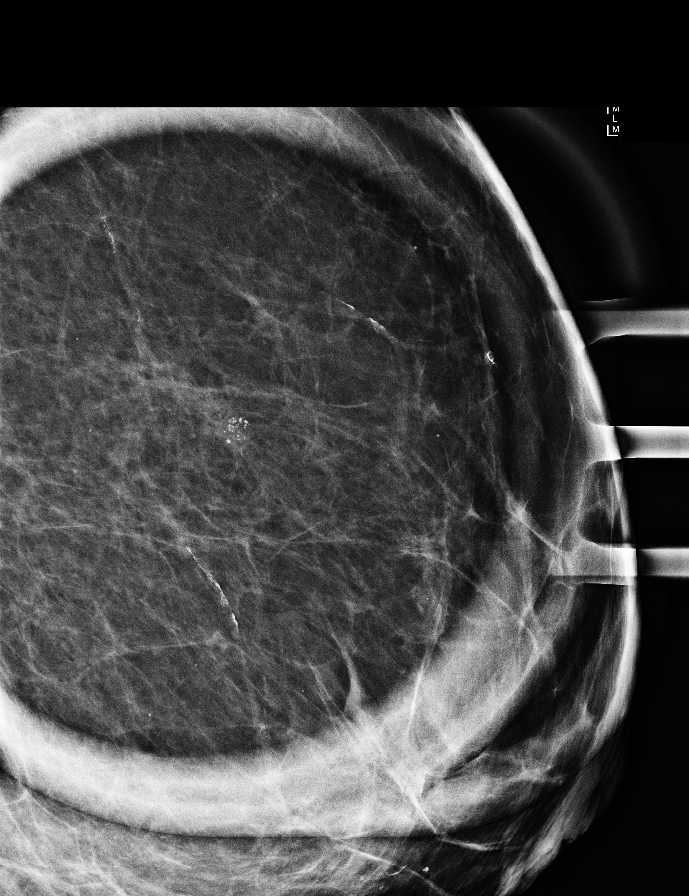

[L MLO synth-2D]
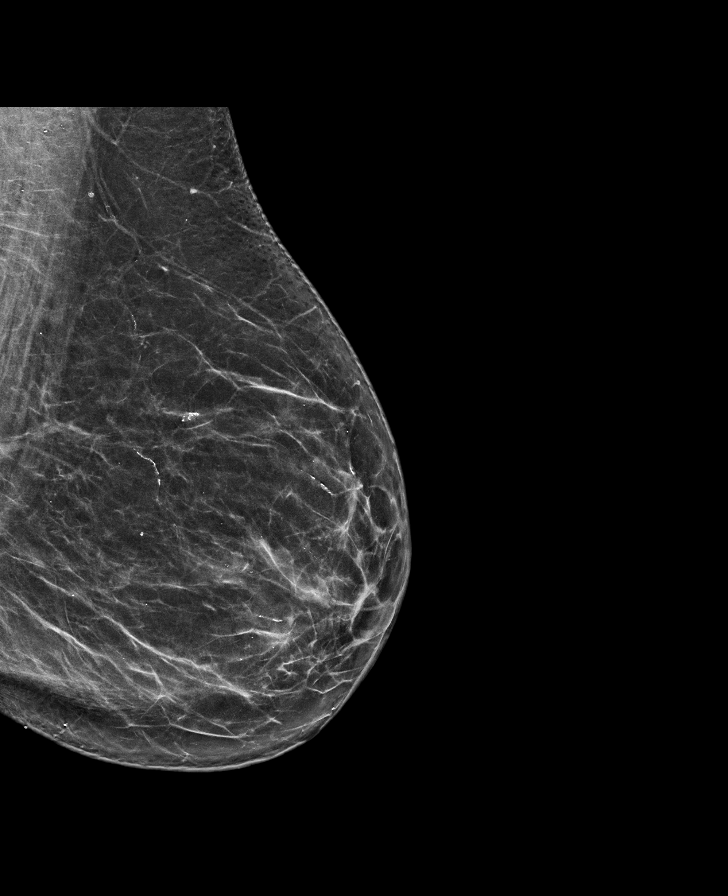

[L CC synth-2D]
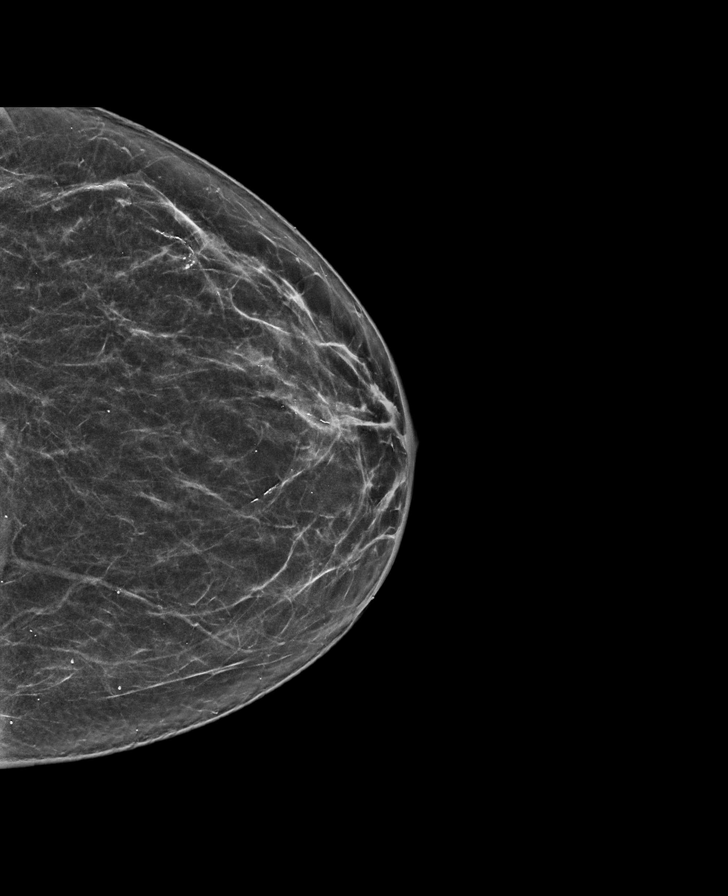

[R MLO synth-2D]
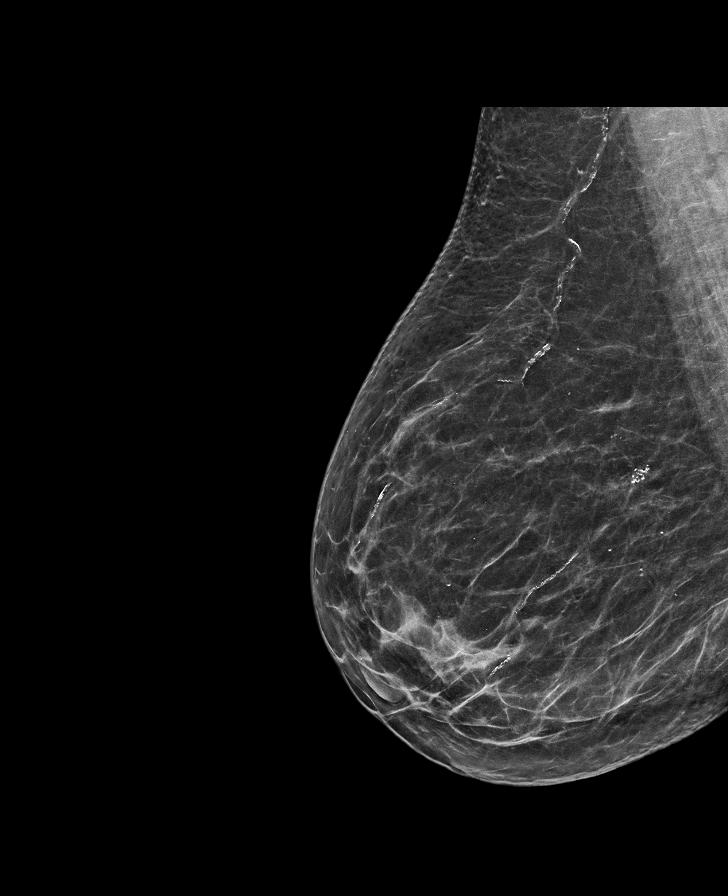

[R CC synth-2D]
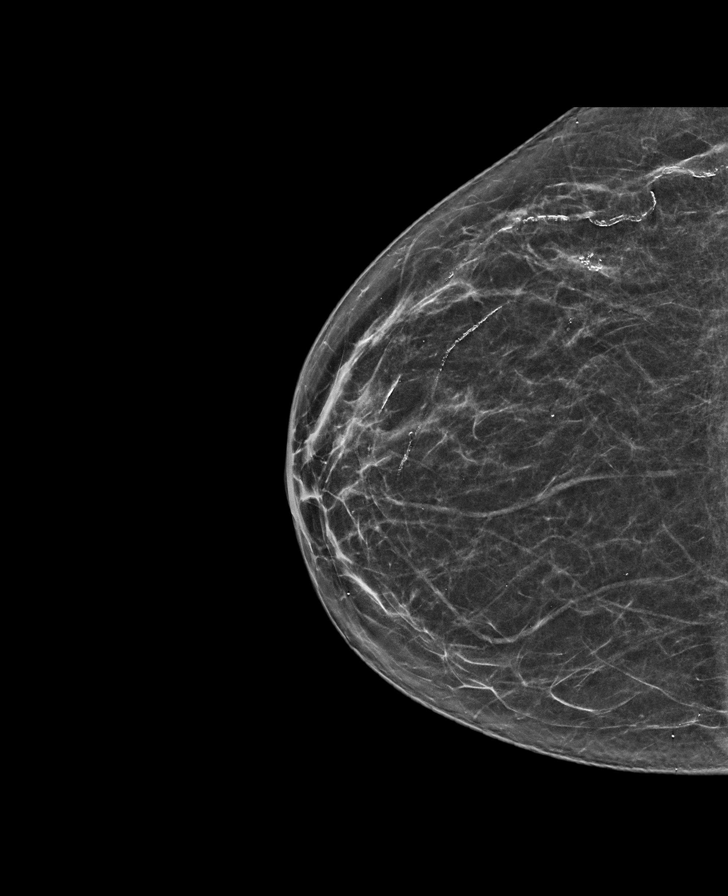

[8 of 28 positions shown; findings below may reference images not displayed]

ACR Breast Density Category b: There are scattered areas of
fibroglandular density.
FINDINGS: The small group of coarse heterogeneous calcifications in the
lateral posterior right and upper outer posterior left breast are
mammographically stable. No suspicious calcifications, masses or
areas of distortion are seen in the bilateral breasts.

Mammographic images were processed with CAD.
IMPRESSION: 1. The bilateral calcifications have demonstrated 2 years of
stability, and are therefore benign.

2.  No mammographic evidence of malignancy in the bilateral breasts.

RECOMMENDATION:
Screening mammogram in one year.(Code:[id number]-[id number])

I have discussed the findings and recommendations with the patient.
Results were also provided in writing at the conclusion of the
visit. If applicable, a reminder letter will be sent to the patient
regarding the next appointment.

BI-RADS CATEGORY  2: Benign.

## 2019-03-05 ENCOUNTER — Encounter: Payer: Self-pay | Admitting: Internal Medicine

## 2019-03-08 ENCOUNTER — Encounter: Payer: Self-pay | Admitting: Internal Medicine

## 2019-03-10 ENCOUNTER — Encounter: Payer: Self-pay | Admitting: Internal Medicine

## 2019-03-10 MED ORDER — BENZONATATE 100 MG PO CAPS: 100.0000 mg | ORAL_CAPSULE | Freq: Three times a day (TID) | ORAL | 0 refills | Status: DC | PRN

## 2019-03-10 NOTE — Telephone Encounter (Signed)
Patient would like an alternate cough medication. Please advise.

## 2019-03-11 MED FILL — BENZONATATE 100 MG CAPS: 100 | 10 days supply | Qty: 30 | Fill #0

## 2019-03-24 MED FILL — ?ATENOLOL 100MG TABLET: 100 | 30 days supply | Qty: 30 | Fill #0

## 2019-03-24 MED FILL — HYDROCHLOROTHIAZIDE 25 MG T: 25 | 90 days supply | Qty: 90 | Fill #0

## 2019-03-24 MED FILL — LOSARTAN POTASSIUM 100 MG T: 100 | 90 days supply | Qty: 90 | Fill #0

## 2019-03-24 MED FILL — ?GLIPIZIDE 10 MG TABLET: 10 | 30 days supply | Qty: 60 | Fill #1

## 2019-03-24 MED FILL — CLOPIDOGREL 75 MG TABLET: 75 | 30 days supply | Qty: 30 | Fill #4

## 2019-03-24 MED FILL — metFORMIN HCL 1000 MG TABS: 1000 | 30 days supply | Qty: 60 | Fill #1

## 2019-03-27 ENCOUNTER — Other Ambulatory Visit: Payer: Self-pay

## 2019-03-27 ENCOUNTER — Encounter: Payer: Self-pay | Admitting: Internal Medicine

## 2019-03-27 ENCOUNTER — Ambulatory Visit: Payer: Self-pay | Attending: Internal Medicine | Admitting: Internal Medicine

## 2019-03-27 VITALS — BP 145/78 | HR 91 | Temp 98.7°F | Resp 16 | Wt 178.2 lb

## 2019-03-27 DIAGNOSIS — IMO0002 Reserved for concepts with insufficient information to code with codable children: Secondary | ICD-10-CM

## 2019-03-27 DIAGNOSIS — Z794 Long term (current) use of insulin: Secondary | ICD-10-CM

## 2019-03-27 DIAGNOSIS — I2510 Atherosclerotic heart disease of native coronary artery without angina pectoris: Secondary | ICD-10-CM

## 2019-03-27 DIAGNOSIS — I10 Essential (primary) hypertension: Secondary | ICD-10-CM

## 2019-03-27 DIAGNOSIS — E1159 Type 2 diabetes mellitus with other circulatory complications: Secondary | ICD-10-CM

## 2019-03-27 DIAGNOSIS — E1165 Type 2 diabetes mellitus with hyperglycemia: Secondary | ICD-10-CM

## 2019-03-27 DIAGNOSIS — J321 Chronic frontal sinusitis: Secondary | ICD-10-CM

## 2019-03-27 DIAGNOSIS — J209 Acute bronchitis, unspecified: Secondary | ICD-10-CM

## 2019-03-27 LAB — GLUCOSE, POCT (MANUAL RESULT ENTRY): POC Glucose: 228 mg/dl — AB (ref 70–99)

## 2019-03-27 MED ORDER — AMOXICILLIN-POT CLAVULANATE 500-125 MG PO TABS: 1.0000 | ORAL_TABLET | Freq: Three times a day (TID) | ORAL | 0 refills | Status: DC

## 2019-03-27 MED ORDER — ALBUTEROL SULFATE HFA 108 (90 BASE) MCG/ACT IN AERS: 2.0000 | INHALATION_SPRAY | Freq: Four times a day (QID) | RESPIRATORY_TRACT | 0 refills | Status: DC | PRN

## 2019-03-27 MED FILL — !VENTOLIN HFA INHALER: 108 (90 BAS | 25 days supply | Qty: 18 | Fill #0

## 2019-03-27 MED FILL — AMOX-CLAV 500-125 MG TABLET: 500-125 | 7 days supply | Qty: 21 | Fill #0

## 2019-03-27 NOTE — Patient Instructions (Signed)
Please give patient a follow-up appointment in 2 weeks with the clinical pharmacist for flu shot and repeat blood pressure check.  Please go to the radiology department at [facility] to get your x-ray done.

## 2019-03-27 NOTE — Progress Notes (Signed)
Patient ID: Breanna Obrien, female    DOB: 21-Aug-1955  MRN: [id number]  CC: Hypertension, Diabetes, and Cough   Subjective: Breanna Obrien is a 63 y.o. 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  She was concerned she may of had Covid so she went and had a testing done which was negative.  She had contacted [place] via MyChart requesting something for the cough.  Prescribe some Tessalon Perles but they have not been helpful.  She is still having cough especially when she tries to lay back.  She feels it may be a sinus infection as she is having a lot of pain and pressure over the frontal sinuses with green nasal mucus.  She gets drainage at the back of the throat and when she coughs the mucus that comes up is also greenish.  She has not had any further fevers.  No shortness of breath.  DM Lab Results  Component Value Date   HGBA1C 7.2 (H) 12/27/2018   -BS up and down over past several wks since resp illness -checks BS QID.  Range 160-200s Meds:  Compliant with Levemir, Metformin and Farxiga Has eye appt next wk  HTN: compliant with med and salt restricion Checks BP daily.  Range 121/78.  She is not sure why it is elevated today.  She has taken her medicines already for today.  CAD/No CP no chest pains.  Compliant with Plavix, Lipitor, atenolol  HM:  Mailed FIT yesterday Patient Active Problem List   Diagnosis Date Noted  . Abnormal mammogram 01/15/2017  . Coronary artery disease involving native coronary artery of native heart without angina pectoris 01/15/2017  . Hx of Bell's palsy 01/15/2017  . Insomnia due to medical condition 10/24/2016  . Depression 10/24/2016  . Type 2 diabetes mellitus with complication, without long-term current use of  insulin ([place]) 10/06/2016  . Essential hypertension 10/06/2016  . Ataxia   . Hyperlipidemia   . Bilateral carotid artery stenosis   . CVA (cerebral vascular accident) ([place]) 10/05/2016     Current Outpatient Medications on File Prior to Visit  Medication Sig Dispense Refill  . amLODipine (NORVASC) 10 MG tablet Take 1 tablet (10 mg total) by mouth daily. 90 tablet 3  . atenolol (TENORMIN) 100 MG tablet Take 1 tablet (100 mg total) by mouth every evening. 90 tablet 3  . atorvastatin (LIPITOR) 40 MG tablet Take 1 tablet (40 mg total) by mouth daily at 6 PM. 30 tablet 11  . benzonatate (TESSALON PERLES) 100 MG capsule Take 1 capsule (100 mg total) by mouth 3 (three) times daily as needed for cough. (Patient not taking: Reported on 03/27/2019) 30 capsule 0  . calcium-vitamin D (OSCAL WITH D) 500-200 MG-UNIT tablet Take 1 tablet by mouth.    . clopidogrel (PLAVIX) 75 MG tablet Take 1 tablet (75 mg total) by mouth daily. 30 tablet 6  . dapagliflozin propanediol (FARXIGA) 10 MG TABS tablet Take 10 mg by mouth daily. 30 tablet 6  . fluticasone (FLONASE) 50 MCG/ACT nasal spray Place 1 spray into both nostrils daily as needed for allergies or rhinitis. 16 g 0  . glipiZIDE (GLUCOTROL) 10 MG tablet TAKE 1 TABLET BY MOUTH TWICE DAILY BEFORE A MEAL 60 tablet 6  .  hydrochlorothiazide (HYDRODIURIL) 25 MG tablet Take 1 tablet (25 mg total) by mouth daily. 90 tablet 2  . insulin detemir (LEVEMIR) 100 UNIT/ML injection Inject 0.2 mLs (20 Units total) into the skin at bedtime. 10 mL 6  . Insulin Syringe-Needle U-100 (INSULIN SYRINGE .5CC/30GX1/2") 30G X 1/2" 0.5 ML MISC Use as directed 100 each 6  . loratadine (CLARITIN) 10 MG tablet Take 1 tablet (10 mg total) by mouth daily. 30 tablet 0  . losartan (COZAAR) 100 MG tablet Take 1 tablet (100 mg total) by mouth daily. 90 tablet 3  . metFORMIN (GLUCOPHAGE) 1000 MG tablet Take 1 tablet (1,000 mg total) by mouth 2 (two) times daily. 60 tablet 6  . pneumococcal 23  valent vaccine (PNEUMOVAX 23) 25 MCG/0.5ML injection To be administered by the pharmacist 0.5 mL 0   No current facility-administered medications on file prior to visit.     Allergies  Allergen Reactions  . Trazodone And Nefazodone     Makes her feel jittery    Social History   Socioeconomic History  . Marital status: Married    Spouse name: Not on file  . Number of children: Not on file  . Years of education: Not on file  . Highest education level: Not on file  Occupational History  . Not on file  Social Needs  . Financial resource strain: Not on file  . Food insecurity    Worry: Not on file    Inability: Not on file  . Transportation needs    Medical: Not on file    Non-medical: Not on file  Tobacco Use  . Smoking status: Never Smoker  . Smokeless tobacco: Never Used  Substance and Sexual Activity  . Alcohol use: No  . Drug use: No  . Sexual activity: Yes    Birth control/protection: None  Lifestyle  . Physical activity    Days per week: Not on file    Minutes per session: Not on file  . Stress: Not on file  Relationships  . Social Herbalist on phone: Not on file    Gets together: Not on file    Attends religious service: Not on file    Active member of club or organization: Not on file    Attends meetings of clubs or organizations: Not on file    Relationship status: Not on file  . Intimate partner violence    Fear of current or ex partner: Not on file    Emotionally abused: Not on file    Physically abused: Not on file    Forced sexual activity: Not on file  Other Topics Concern  . Not on file  Social History Narrative  . Not on file    Family History  Problem Relation Age of Onset  . Cancer Mother     Past Surgical History:  Procedure Laterality Date  . CHOLECYSTECTOMY    . CORONARY ANGIOPLASTY WITH STENT PLACEMENT      ROS: Review of Systems Negative except as stated above  PHYSICAL EXAM: BP 136/81   Pulse 91   Temp 98.7  F (37.1 C) (Oral)   Resp 16   Wt 178 lb 3.2 oz (80.8 kg)   SpO2 97%   BMI 31.57 kg/m   Physical Exam  General appearance - alert, well appearing, and in no distress.  No audible congestion Mental status - normal mood, behavior, speech, dress, motor activity, and thought processes Eyes - pupils equal and reactive, extraocular eye  movements intact Nose -mild enlargement of nasal turbinates  mouth -no oral lesions.  She has thick yellow mucus noted draining at the back of the throat Neck - supple, no significant adenopathy Chest -mild diffuse wheezing and rhonchi Heart - normal rate, regular rhythm, normal S1, S2, no murmurs, rubs, clicks or gallops Extremities - peripheral pulses normal, no pedal edema, no clubbing or cyanosis   CMP Latest Ref Rng & Units 05/14/2018 05/17/2017 10/07/2016  Glucose 65 - 99 mg/dL 168(H) 175(H) 119(H)  BUN 8 - 27 mg/dL 18 18 8   Creatinine 0.57 - 1.00 mg/dL 1.05(H) 1.00 0.80  Sodium 134 - 144 mmol/L 138 140 137  Potassium 3.5 - 5.2 mmol/L 4.2 4.4 3.7  Chloride 96 - 106 mmol/L 97 100 101  CO2 20 - 29 mmol/L 23 23 26   Calcium 8.7 - 10.3 mg/dL 10.0 9.8 9.5  Total Protein 6.0 - 8.5 g/dL 7.4 7.5 7.4  Total Bilirubin 0.0 - 1.2 mg/dL 0.3 0.3 0.7  Alkaline Phos 39 - 117 IU/L 92 97 71  AST 0 - 40 IU/L 26 27 42(H)  ALT 0 - 32 IU/L 36(H) 36(H) 67(H)   Lipid Panel     Component Value Date/Time   CHOL 121 05/14/2018 0925   TRIG 120 05/14/2018 0925   HDL 39 (L) 05/14/2018 0925   CHOLHDL 3.1 05/14/2018 0925   CHOLHDL 5.1 10/06/2016 0333   VLDL 31 10/06/2016 0333   LDLCALC 58 05/14/2018 0925    CBC    Component Value Date/Time   WBC 7.9 05/14/2018 0925   WBC 6.4 10/07/2016 0317   RBC 4.04 05/14/2018 0925   RBC 4.12 10/07/2016 0317   HGB 12.2 05/14/2018 0925   HCT 36.4 05/14/2018 0925   PLT 351 05/14/2018 0925   MCV 90 05/14/2018 0925   MCH 30.2 05/14/2018 0925   MCH 31.3 10/07/2016 0317   MCHC 33.5 05/14/2018 0925   MCHC 33.8 10/07/2016 0317    RDW 13.1 05/14/2018 0925   LYMPHSABS 1.2 10/05/2016 1203   MONOABS 0.3 10/05/2016 1203   EOSABS 0.2 10/05/2016 1203   BASOSABS 0.0 10/05/2016 1203    ASSESSMENT AND PLAN:  1. Sinusitis chronic, frontal We will give a course of antibiotics - amoxicillin-clavulanate (AUGMENTIN) 500-125 MG tablet; Take 1 tablet (500 mg total) by mouth 3 (three) times daily.  Dispense: 21 tablet; Refill: 0  2. Bronchitis with bronchospasm See #1 above. - DG Chest 2 View; Future - albuterol (VENTOLIN HFA) 108 (90 Base) MCG/ACT inhaler; Inhale 2 puffs into the lungs every 6 (six) hours as needed for wheezing or shortness of breath.  Dispense: 6.7 g; Refill: 0  3. Uncontrolled type 2 diabetes mellitus with circulatory disorder, with long-term current use of insulin ([place]) Not at goal.  Recommend increasing Levemir to 23 units daily.  Continue metformin, glipizide and Farxiga - POCT glucose (manual entry) - Microalbumin / creatinine urine ratio  4. Essential hypertension Not at goal today.  However she reports good home blood pressure readings.  We will have her follow-up with the clinical pharmacist in 2 weeks for repeat blood pressure check  5. Coronary artery disease involving native coronary artery of native heart without angina pectoris Clinically stable.  Continue current medications    Patient was given the opportunity to ask questions.  Patient verbalized understanding of the plan and was able to repeat key elements of the plan.   Orders Placed This Encounter  Procedures  . Microalbumin / creatinine urine ratio  . POCT  glucose (manual entry)     Requested Prescriptions    No prescriptions requested or ordered in this encounter    No follow-ups on file.  Karle Plumber, MD, FACP

## 2019-03-28 ENCOUNTER — Ambulatory Visit (HOSPITAL_COMMUNITY)
Admission: RE | Admit: 2019-03-28 | Discharge: 2019-03-28 | Disposition: A | Discharge status: Home / Self Care | Payer: No Typology Code available for payment source | Source: Ambulatory Visit | Attending: Internal Medicine | Admitting: Internal Medicine

## 2019-03-28 ENCOUNTER — Encounter: Payer: Self-pay | Admitting: Internal Medicine

## 2019-03-28 DIAGNOSIS — J209 Acute bronchitis, unspecified: Secondary | ICD-10-CM

## 2019-04-07 MED FILL — $LEVEMIR 100U/ML VIAL: 100 | 42 days supply | Qty: 10 | Fill #2

## 2019-04-10 ENCOUNTER — Other Ambulatory Visit: Payer: Self-pay

## 2019-04-10 ENCOUNTER — Ambulatory Visit: Payer: Self-pay | Attending: Internal Medicine | Admitting: Pharmacist

## 2019-04-10 ENCOUNTER — Encounter: Payer: Self-pay | Admitting: Pharmacist

## 2019-04-10 VITALS — BP 138/75 | HR 71

## 2019-04-10 DIAGNOSIS — Z23 Encounter for immunization: Secondary | ICD-10-CM

## 2019-04-10 DIAGNOSIS — I10 Essential (primary) hypertension: Secondary | ICD-10-CM

## 2019-04-10 NOTE — Progress Notes (Signed)
   S:    PCP: Dr. Wynetta Emery Patient arrives in good spirits. Presents to the clinic for hypertension evaluation, counseling, and management.  Patient was referred and last seen by Primary Care Provider on 03/27/19.    Patient reports adherence with medications.  Current BP Medications include:  Amlodipine 10 mg daily, atenolol 100 mg daily, HCTZ 25 mg daily, losartan 100  Dietary habits include: limits salt, drinks 2 cups of coffee in the AM Exercise habits include: used to walk 3-4 days a week but is unable to at this point d/t some family situations Family / Social history:  - no pertinent positives  - never smoker  - denies alcohol use   O:  Vitals:   04/10/19 1036  BP: 138/75  Pulse: 71    Home BP readings: 120s-130s/70-80s  Last 3 Office BP readings: BP Readings from Last 3 Encounters:  04/10/19 138/75  03/27/19 (!) 145/78  05/14/18 132/75   BMET    Component Value Date/Time   NA 138 05/14/2018 0925   K 4.2 05/14/2018 0925   CL 97 05/14/2018 0925   CO2 23 05/14/2018 0925   GLUCOSE 168 (H) 05/14/2018 0925   GLUCOSE 119 (H) 10/07/2016 0317   BUN 18 05/14/2018 0925   CREATININE 1.05 (H) 05/14/2018 0925   CALCIUM 10.0 05/14/2018 0925   GFRNONAA 57 (L) 05/14/2018 0925   GFRAA 66 05/14/2018 0925   Renal function: CrCl cannot be calculated (Patient's most recent lab result is older than the maximum 21 days allowed.).  Clinical ASCVD: Yes  The ASCVD Risk score Mikey Bussing DC Jr., et al., 2013) failed to calculate for the following reasons:   The patient has a prior MI or stroke diagnosis  A/P: Hypertension longstanding currently above goal on current medications. BP Goal = <130/80 mmHg. Patient reports adherence. She has goal BP at home. Will have her take all medications in the evening with the exception of HCTZ in the AM.   -Continued current medications. -Counseled on lifestyle modifications for blood pressure control including reduced dietary sodium, increased  exercise, adequate sleep  Results reviewed and written information provided. Total time in face-to-face counseling 15 minutes.   F/U Clinic Visit in 1 month.    Patient seen with: Lesle Reek, PharmD Candidate [employer] Class of 2022  Benard Halsted, PharmD, [place] [phone]

## 2019-04-10 NOTE — Patient Instructions (Signed)
Thank you for coming to see [place] today.   Blood pressure today is close to goal.  Continue taking blood pressure medications as prescribed. Try taking at bedtime. Keep the HCTZ in the AM.   Limiting salt and caffeine, as well as exercising as able for at least 30 minutes for 5 days out of the week, can also help you lower your blood pressure.  Take your blood pressure at home if you are able. Please write down these numbers and bring them to your visits.  If you have any questions about medications, please call me [phone].  Lurena Joiner

## 2019-04-28 MED FILL — CLOPIDOGREL 75 MG TABLET: 75 | 30 days supply | Qty: 30 | Fill #5

## 2019-04-28 MED FILL — metFORMIN HCL 1000 MG TABS: 1000 | 30 days supply | Qty: 60 | Fill #2

## 2019-04-28 MED FILL — ?ATENOLOL 100MG TABLET: 100 | 30 days supply | Qty: 30 | Fill #1

## 2019-04-28 MED FILL — ?GLIPIZIDE 10 MG TABLET: 10 | 30 days supply | Qty: 60 | Fill #2

## 2019-05-12 ENCOUNTER — Other Ambulatory Visit: Payer: Self-pay

## 2019-05-12 ENCOUNTER — Ambulatory Visit: Payer: Self-pay | Attending: Internal Medicine | Admitting: Pharmacist

## 2019-05-12 VITALS — BP 108/67 | HR 64

## 2019-05-12 DIAGNOSIS — I10 Essential (primary) hypertension: Secondary | ICD-10-CM

## 2019-05-12 NOTE — Patient Instructions (Signed)

## 2019-05-12 NOTE — Progress Notes (Signed)
   S:    PCP: Dr. Wynetta Emery Patient arrives in good spirits. Presents to the clinic for hypertension evaluation, counseling, and management.  Patient was referred and last seen by Primary Care Provider on 03/27/19. I saw her on 04/10/19 and made no therapy changes. I did recommend night time dosing and pt reports adapting to this. Her pressures at home have come down a bit as well.     Patient reports adherence with medications.  Current BP Medications include:  Amlodipine 10 mg daily, atenolol 100 mg daily, HCTZ 25 mg daily, losartan 100 mg   Dietary habits include: limits salt, drinks 2 cups of coffee in the AM Exercise habits include: used to walk 3-4 days a week but is unable to at this point d/t some family situations Family / Social history:  - no pertinent positives  - never smoker  - denies alcohol use   O:  Vitals:   05/12/19 1010  BP: 108/67  Pulse: 64    Home BP readings: 117-130s/70-80s  Last 3 Office BP readings: BP Readings from Last 3 Encounters:  05/12/19 108/67  04/10/19 138/75  03/27/19 (!) 145/78   BMET    Component Value Date/Time   NA 138 05/14/2018 0925   K 4.2 05/14/2018 0925   CL 97 05/14/2018 0925   CO2 23 05/14/2018 0925   GLUCOSE 168 (H) 05/14/2018 0925   GLUCOSE 119 (H) 10/07/2016 0317   BUN 18 05/14/2018 0925   CREATININE 1.05 (H) 05/14/2018 0925   CALCIUM 10.0 05/14/2018 0925   GFRNONAA 57 (L) 05/14/2018 0925   GFRAA 66 05/14/2018 0925   Renal function: CrCl cannot be calculated (Patient's most recent lab result is older than the maximum 21 days allowed.).  Clinical ASCVD: Yes  The ASCVD Risk score Mikey Bussing DC Jr., et al., 2013) failed to calculate for the following reasons:   The patient has a prior MI or stroke diagnosis  A/P: Hypertension longstanding currently at goal on current medications. BP Goal = <130/80 mmHg. Patient reports adherence. She has goal BP at home.  -Continued current medications. -Counseled on lifestyle  modifications for blood pressure control including reduced dietary sodium, increased exercise, adequate sleep  Results reviewed and written information provided. Total time in face-to-face counseling 15 minutes.   F/U Clinic Visit w/ PCP.   Benard Halsted, PharmD, [place] [phone]

## 2019-05-26 ENCOUNTER — Encounter: Payer: Self-pay | Admitting: Internal Medicine

## 2019-05-27 MED FILL — ?ATENOLOL 100MG TABLET: 100 | 30 days supply | Qty: 30 | Fill #2

## 2019-05-27 MED FILL — metFORMIN HCL 1000 MG TABS: 1000 | 30 days supply | Qty: 60 | Fill #3

## 2019-05-27 MED FILL — CLOPIDOGREL 75 MG TABLET: 75 | 30 days supply | Qty: 30 | Fill #6

## 2019-05-27 MED FILL — ?GLIPIZIDE 10 MG TABLET: 10 | 30 days supply | Qty: 60 | Fill #3

## 2019-05-27 MED FILL — $LEVEMIR 100U/ML VIAL: 100 | 42 days supply | Qty: 10 | Fill #3

## 2019-06-25 ENCOUNTER — Other Ambulatory Visit: Payer: Self-pay | Admitting: Internal Medicine

## 2019-06-25 DIAGNOSIS — I2510 Atherosclerotic heart disease of native coronary artery without angina pectoris: Secondary | ICD-10-CM

## 2019-06-25 MED FILL — ?HYDROCHLOROTHIAZIDE 25MG T: 25 | 30 days supply | Qty: 30 | Fill #1

## 2019-06-25 MED FILL — ?ATENOLOL 100 MG TAB: 100 | 30 days supply | Qty: 30 | Fill #3

## 2019-06-25 MED FILL — CLOPIDOGREL 75 MG TABLET: 75 | 30 days supply | Qty: 30 | Fill #0

## 2019-06-25 MED FILL — AMLODIPINE BESYLATE 10 MG T: 10 | 90 days supply | Qty: 90 | Fill #1

## 2019-06-25 MED FILL — ?ATORVASTATIN 40MG TABL: 40 | 30 days supply | Qty: 30 | Fill #0

## 2019-06-25 MED FILL — metFORMIN HCL 1000 MG TABS: 1000 | 30 days supply | Qty: 60 | Fill #4

## 2019-06-25 MED FILL — ?GLIPIZIDE 10 MG TABLET: 10 | 30 days supply | Qty: 60 | Fill #4

## 2019-06-25 NOTE — Telephone Encounter (Signed)
Last OV 10/29. Please refill if appropriate to continue.

## 2019-06-27 ENCOUNTER — Encounter: Payer: Self-pay | Admitting: Internal Medicine

## 2019-06-27 ENCOUNTER — Ambulatory Visit: Payer: Medicaid Other | Attending: Internal Medicine | Admitting: Internal Medicine

## 2019-06-27 DIAGNOSIS — I2510 Atherosclerotic heart disease of native coronary artery without angina pectoris: Secondary | ICD-10-CM

## 2019-06-27 DIAGNOSIS — E1159 Type 2 diabetes mellitus with other circulatory complications: Secondary | ICD-10-CM

## 2019-06-27 DIAGNOSIS — E785 Hyperlipidemia, unspecified: Secondary | ICD-10-CM

## 2019-06-27 DIAGNOSIS — Z1211 Encounter for screening for malignant neoplasm of colon: Secondary | ICD-10-CM

## 2019-06-27 DIAGNOSIS — I10 Essential (primary) hypertension: Secondary | ICD-10-CM

## 2019-06-27 DIAGNOSIS — E1169 Type 2 diabetes mellitus with other specified complication: Secondary | ICD-10-CM

## 2019-06-27 DIAGNOSIS — Z794 Long term (current) use of insulin: Secondary | ICD-10-CM

## 2019-06-27 MED ORDER — AMLODIPINE BESYLATE 10 MG PO TABS: 10.0000 mg | ORAL_TABLET | Freq: Every day | ORAL | 3 refills | Status: DC

## 2019-06-27 MED ORDER — ATORVASTATIN CALCIUM 40 MG PO TABS: 40.0000 mg | ORAL_TABLET | Freq: Every day | ORAL | 3 refills | Status: DC

## 2019-06-27 MED ORDER — HYDROCHLOROTHIAZIDE 25 MG PO TABS: 25.0000 mg | ORAL_TABLET | Freq: Every day | ORAL | 3 refills | Status: DC

## 2019-06-27 MED ORDER — FARXIGA 10 MG PO TABS: 10.0000 mg | ORAL_TABLET | Freq: Every day | ORAL | 3 refills | Status: DC

## 2019-06-27 MED ORDER — CLOPIDOGREL BISULFATE 75 MG PO TABS: 75.0000 mg | ORAL_TABLET | Freq: Every day | ORAL | 3 refills | Status: DC

## 2019-06-27 MED ORDER — METFORMIN HCL 1000 MG PO TABS: 1000.0000 mg | ORAL_TABLET | Freq: Two times a day (BID) | ORAL | 3 refills | Status: DC

## 2019-06-27 MED ORDER — INSULIN DETEMIR 100 UNIT/ML ~~LOC~~ SOLN: 23.0000 [IU] | Freq: Every day | SUBCUTANEOUS | 6 refills | Status: DC

## 2019-06-27 MED FILL — $LEVEMIR 100U/ML VIAL: 100 | 42 days supply | Qty: 10 | Fill #0

## 2019-06-27 NOTE — Progress Notes (Signed)
Virtual Visit via Telephone Note Due to current restrictions/limitations of in-office visits due to the COVID-19 pandemic, this scheduled clinical appointment was converted to a telehealth visit  I connected with Breanna Obrien on 06/27/19 at 11:10 AM EST by telephone and verified that I am speaking with the correct person using two identifiers. I am in my office.  The patient is at home.  Only the patient and myself participated in this encounter.  I discussed the limitations, risks, security and privacy concerns of performing an evaluation and management service by telephone and the availability of in person appointments. I also discussed with the patient that there may be a patient responsible charge related to this service. The patient expressed understanding and agreed to proceed.   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  Last visit 02/2019  DIABETES TYPE 2 Last A1C:   Results for orders placed or performed in visit on 03/27/19  POCT glucose (manual entry)  Result Value Ref Range   POC Glucose 228 (A) 70 - 99 mg/dl    Med Adherence:  [x]  Yes    []  No Medication side effects:  []  Yes    [x]  No Home Monitoring?  [x]  Yes - twice a day 2 hrs after meals   Home glucose results range: this a.m post BF was 118, last night post dinner was 174 Diet Adherence: [x]  Yes    []  No Exercise: [x]  Yes - doing a little more and moves a lot att work Hypoglycemic episodes?: [x]  Yes - at nights, can drop into the 50's.  Occurs 1-2 x a mth and not every mth.  Can feel when BS low Numbness of the feet? []  Yes    [x]  No Retinopathy hx? []  Yes    [x]  No Last eye exam: 05/26/2019 Comments:   HYPERTENSION/CAD/HL Currently taking: see medication list Med Adherence: [x]  Yes    []  No Medication side effects: []  Yes    [x]  No Adherence with salt restriction: [x]  Yes    []   No Home Monitoring?: [x]  Yes    2-5 x a day Monitoring Frequency: []  Yes    []  No Home BP results range: 109-120s/70 SOB? []  Yes    [x]  No Chest Pain?: []  Yes    [x]  No Leg swelling?: []  Yes    [x]  No Headaches?: []  Yes    [x]  No Dizziness? []  Yes    [x]  No Comments:   Current Outpatient Medications  Medication Instructions  . albuterol (VENTOLIN HFA) 108 (90 Base) MCG/ACT inhaler 2 puffs, Inhalation, Every 6 hours PRN  . amLODipine (NORVASC) 10 mg, Oral, Daily  . amoxicillin-clavulanate (AUGMENTIN) 500-125 MG tablet 500 mg, Oral, 3 times daily  . atenolol (TENORMIN) 100 mg, Oral, Every evening  . atorvastatin (LIPITOR) 40 mg, Oral, Daily-1800  . benzonatate (TESSALON PERLES) 100 mg, Oral, 3 times daily PRN  . calcium-vitamin D (OSCAL WITH D) 500-200 MG-UNIT tablet 1 tablet, Oral  . clopidogrel (PLAVIX) 75 MG tablet TAKE 1 TABLET BY MOUTH DAILY.  . dapagliflozin propanediol (FARXIGA) 10 mg, Oral, Daily  . fluticasone (FLONASE) 50 MCG/ACT nasal spray 1 spray, Each Nare, Daily PRN  . glipiZIDE (GLUCOTROL) 10 MG tablet TAKE 1 TABLET BY MOUTH TWICE DAILY BEFORE A MEAL  . hydrochlorothiazide (HYDRODIURIL) 25 mg, Oral, Daily  . insulin detemir (LEVEMIR) 20 Units, Subcutaneous, Daily at bedtime  . Insulin  Syringe-Needle U-100 (INSULIN SYRINGE .5CC/30GX1/2") 30G X 1/2" 0.5 ML MISC Use as directed  . loratadine (CLARITIN) 10 mg, Oral, Daily  . losartan (COZAAR) 100 mg, Oral, Daily  . metFORMIN (GLUCOPHAGE) 1,000 mg, Oral, 2 times daily  . pneumococcal 23 valent vaccine (PNEUMOVAX 23) 25 MCG/0.5ML injection To be administered by the pharmacist      Observations/Objective: Results for orders placed or performed in visit on 03/27/19  POCT glucose (manual entry)  Result Value Ref Range   POC Glucose 228 (A) 70 - 99 mg/dl     Assessment and Plan: 1. Controlled type 2 diabetes mellitus with other circulatory complication, with long-term current use of insulin ([employer]) Reported blood sugar  readings 2 hours after meal are at goal.  She will come in for an A1c and other blood tests.  She will continue current medications.  Encourage regular exercise.  Continue healthy eating habits. -Hypoglycemic episodes are infrequent.  We went over signs and symptoms and how to treat.  Encouraged her to purchase some glucose tablets from over-the-counter and keep them with her at all times - metFORMIN (GLUCOPHAGE) 1000 MG tablet; Take 1 tablet (1,000 mg total) by mouth 2 (two) times daily.  Dispense: 180 tablet; Refill: 3 - dapagliflozin propanediol (FARXIGA) 10 MG TABS tablet; Take 10 mg by mouth daily.  Dispense: 90 tablet; Refill: 3 - CBC; Future - Comprehensive metabolic panel; Future - Lipid panel; Future - Hemoglobin A1c; Future  2. Essential hypertension Reported readings are at goal.  Continue current medications and low-salt diet - amLODipine (NORVASC) 10 MG tablet; Take 1 tablet (10 mg total) by mouth daily.  Dispense: 90 tablet; Refill: 3 - hydrochlorothiazide (HYDRODIURIL) 25 MG tablet; Take 1 tablet (25 mg total) by mouth daily.  Dispense: 90 tablet; Refill: 3  3. Hyperlipidemia associated with type 2 diabetes mellitus (HCC) - atorvastatin (LIPITOR) 40 MG tablet; Take 1 tablet (40 mg total) by mouth daily at 6 PM.  Dispense: 90 tablet; Refill: 3  4. Coronary artery disease involving native coronary artery of native heart without angina pectoris Stable.  Continue beta-blocker, Lipitor, Plavix - atorvastatin (LIPITOR) 40 MG tablet; Take 1 tablet (40 mg total) by mouth daily at 6 PM.  Dispense: 90 tablet; Refill: 3 - clopidogrel (PLAVIX) 75 MG tablet; Take 1 tablet (75 mg total) by mouth daily.  Dispense: 90 tablet; Refill: 3  5. Screening for colon cancer - Fecal occult blood, imunochemical   Follow Up Instructions: 3-4 mths   I discussed the assessment and treatment plan with the patient. The patient was provided an opportunity to ask questions and all were answered. The  patient agreed with the plan and demonstrated an understanding of the instructions.   The patient was advised to call back or seek an in-person evaluation if the symptoms worsen or if the condition fails to improve as anticipated.  I provided 15 minutes of non-face-to-face time during this encounter.   Karle Plumber, MD

## 2019-06-27 NOTE — Progress Notes (Signed)
Pt states her blood sugar was 118 after breakfast

## 2019-07-23 MED FILL — ?ATORVASTATIN 40MG TABLET: 40 | 30 days supply | Qty: 30 | Fill #1

## 2019-07-23 MED FILL — metFORMIN HCL 1000 MG TABS: 1000 | 30 days supply | Qty: 60 | Fill #5

## 2019-07-23 MED FILL — LOSARTAN POTASSIUM 100 MG T: 100 | 90 days supply | Qty: 90 | Fill #1

## 2019-07-23 MED FILL — CLOPIDOGREL 75 MG TABLET: 75 | 30 days supply | Qty: 30 | Fill #1

## 2019-07-23 MED FILL — ?HYDROCHLOROTHIAZIDE 25MG T: 25 | 30 days supply | Qty: 30 | Fill #2

## 2019-07-23 MED FILL — ?GLIPIZIDE 10 MG TABLET: 10 | 30 days supply | Qty: 60 | Fill #5

## 2019-07-23 MED FILL — ATENOLOL 100 MG TAB: 100 | 30 days supply | Qty: 30 | Fill #4

## 2019-07-23 MED FILL — $LEVEMIR 100U/ML VIAL: 100 | 42 days supply | Qty: 10 | Fill #4

## 2019-07-25 ENCOUNTER — Other Ambulatory Visit: Payer: Self-pay

## 2019-07-25 ENCOUNTER — Ambulatory Visit: Payer: No Typology Code available for payment source

## 2019-07-25 ENCOUNTER — Ambulatory Visit: Payer: Self-pay | Attending: Internal Medicine

## 2019-07-25 DIAGNOSIS — Z794 Long term (current) use of insulin: Secondary | ICD-10-CM

## 2019-07-25 DIAGNOSIS — E1159 Type 2 diabetes mellitus with other circulatory complications: Secondary | ICD-10-CM

## 2019-07-26 ENCOUNTER — Ambulatory Visit: Payer: Medicaid Other | Attending: Internal Medicine

## 2019-07-26 DIAGNOSIS — Z23 Encounter for immunization: Secondary | ICD-10-CM

## 2019-07-26 LAB — CBC
Hematocrit: 38.3 % (ref 34.0–46.6)
Hemoglobin: 12.9 g/dL (ref 11.1–15.9)
MCH: 30.4 pg (ref 26.6–33.0)
MCHC: 33.7 g/dL (ref 31.5–35.7)
MCV: 90 fL (ref 79–97)
Platelets: 338 10*3/uL (ref 150–450)
RBC: 4.25 x10E6/uL (ref 3.77–5.28)
RDW: 13.4 % (ref 11.7–15.4)
WBC: 6 10*3/uL (ref 3.4–10.8)

## 2019-07-26 LAB — COMPREHENSIVE METABOLIC PANEL
ALT: 37 IU/L — ABNORMAL HIGH (ref 0–32)
AST: 26 IU/L (ref 0–40)
Albumin/Globulin Ratio: 1.4 (ref 1.2–2.2)
Albumin: 4.5 g/dL (ref 3.8–4.8)
Alkaline Phosphatase: 87 IU/L (ref 39–117)
BUN/Creatinine Ratio: 16 (ref 12–28)
BUN: 16 mg/dL (ref 8–27)
Bilirubin Total: 0.3 mg/dL (ref 0.0–1.2)
CO2: 22 mmol/L (ref 20–29)
Calcium: 10 mg/dL (ref 8.7–10.3)
Chloride: 98 mmol/L (ref 96–106)
Creatinine, Ser: 0.99 mg/dL (ref 0.57–1.00)
GFR calc Af Amer: 70 mL/min/{1.73_m2} (ref >59)
GFR calc non Af Amer: 61 mL/min/{1.73_m2} (ref >59)
Globulin, Total: 3.2 g/dL (ref 1.5–4.5)
Glucose: 112 mg/dL — ABNORMAL HIGH (ref 65–99)
Potassium: 4.2 mmol/L (ref 3.5–5.2)
Sodium: 138 mmol/L (ref 134–144)
Total Protein: 7.7 g/dL (ref 6.0–8.5)

## 2019-07-26 LAB — LIPID PANEL
Chol/HDL Ratio: 3.2 ratio (ref 0.0–4.4)
Cholesterol, Total: 134 mg/dL (ref 100–199)
HDL: 42 mg/dL (ref >39)
LDL Chol Calc (NIH): 65 mg/dL (ref 0–99)
Triglycerides: 158 mg/dL — ABNORMAL HIGH (ref 0–149)
VLDL Cholesterol Cal: 27 mg/dL (ref 5–40)

## 2019-07-26 LAB — HEMOGLOBIN A1C
Est. average glucose Bld gHb Est-mCnc: 160 mg/dL
Hgb A1c MFr Bld: 7.2 % — ABNORMAL HIGH (ref 4.8–5.6)

## 2019-07-26 NOTE — Progress Notes (Signed)
   Covid-19 Vaccination Clinic  Name:  Breanna Obrien    MRN: [id number] DOB: May 07, 1956  07/26/2019  Breanna Obrien was observed post Covid-19 immunization for 15 minutes without incidence. She was provided with Vaccine Information Sheet and instruction to access the V-Safe system.   Breanna Obrien was instructed to call 911 with any severe reactions post vaccine: Marland Kitchen Difficulty breathing  . Swelling of your face and throat  . A fast heartbeat  . A bad rash all over your body  . Dizziness and weakness    Immunizations Administered    Name Date Dose VIS Date Route   Pfizer COVID-19 Vaccine 07/26/2019 10:28 AM 0.3 mL 05/09/2019 Intramuscular   Manufacturer: [place]   Lot: [id number]   [place]: [id number]

## 2019-08-16 ENCOUNTER — Ambulatory Visit: Payer: Medicaid Other | Attending: Internal Medicine

## 2019-08-16 DIAGNOSIS — Z23 Encounter for immunization: Secondary | ICD-10-CM

## 2019-08-16 NOTE — Progress Notes (Signed)
   Covid-19 Vaccination Clinic  Name:  Breanna Obrien    MRN: [id number] DOB: 15-Jun-1955  08/16/2019  [name] was observed post Covid-19 immunization for 15 minutes without incident. She was provided with Vaccine Information Sheet and instruction to access the V-Safe system.   [name] was instructed to call 911 with any severe reactions post vaccine: Marland Kitchen Difficulty breathing  . Swelling of face and throat  . A fast heartbeat  . A bad rash all over body  . Dizziness and weakness   Immunizations Administered    Name Date Dose VIS Date Route   Pfizer COVID-19 Vaccine 08/16/2019 12:13 PM 0.3 mL 05/09/2019 Intramuscular   Manufacturer: [place]   Lot: [id number]   [place]: [id number]

## 2019-08-19 ENCOUNTER — Ambulatory Visit: Payer: No Typology Code available for payment source | Admitting: Internal Medicine

## 2019-08-20 ENCOUNTER — Ambulatory Visit: Payer: No Typology Code available for payment source

## 2019-08-25 MED FILL — ?ATORVASTATIN 40MG TABLET: 40 | 30 days supply | Qty: 30 | Fill #2

## 2019-08-25 MED FILL — ?HYDROCHLOROTHIAZIDE 25MG T: 25 | 30 days supply | Qty: 30 | Fill #3

## 2019-08-25 MED FILL — ATENOLOL 100 MG TABLET: 100 | 30 days supply | Qty: 30 | Fill #5

## 2019-08-25 MED FILL — ?GLIPIZIDE 10 MG TABLET: 10 | 30 days supply | Qty: 60 | Fill #6

## 2019-08-25 MED FILL — CLOPIDOGREL 75 MG TABLET: 75 | 30 days supply | Qty: 30 | Fill #2

## 2019-08-25 MED FILL — metFORMIN HCL 1000 MG TABS: 1000 | 30 days supply | Qty: 60 | Fill #6

## 2019-09-01 MED FILL — !LEVEMIR 100 UNITS/ML VIAL: 100/ML | 42 days supply | Qty: 10 | Fill #5

## 2019-09-19 ENCOUNTER — Encounter: Payer: Self-pay | Admitting: Internal Medicine

## 2019-09-19 ENCOUNTER — Ambulatory Visit: Payer: Medicaid Other | Attending: Internal Medicine | Admitting: Internal Medicine

## 2019-09-19 ENCOUNTER — Other Ambulatory Visit: Payer: Self-pay

## 2019-09-19 VITALS — BP 131/73 | HR 72 | Temp 96.4°F | Resp 16 | Wt 179.8 lb

## 2019-09-19 DIAGNOSIS — I10 Essential (primary) hypertension: Secondary | ICD-10-CM

## 2019-09-19 DIAGNOSIS — I2510 Atherosclerotic heart disease of native coronary artery without angina pectoris: Secondary | ICD-10-CM

## 2019-09-19 DIAGNOSIS — E1159 Type 2 diabetes mellitus with other circulatory complications: Secondary | ICD-10-CM

## 2019-09-19 DIAGNOSIS — E785 Hyperlipidemia, unspecified: Secondary | ICD-10-CM

## 2019-09-19 DIAGNOSIS — Z794 Long term (current) use of insulin: Secondary | ICD-10-CM

## 2019-09-19 DIAGNOSIS — Z6831 Body mass index (BMI) 31.0-31.9, adult: Secondary | ICD-10-CM

## 2019-09-19 DIAGNOSIS — E6609 Other obesity due to excess calories: Secondary | ICD-10-CM

## 2019-09-19 DIAGNOSIS — E1169 Type 2 diabetes mellitus with other specified complication: Secondary | ICD-10-CM

## 2019-09-19 LAB — GLUCOSE, POCT (MANUAL RESULT ENTRY): POC Glucose: 229 mg/dl — AB (ref 70–99)

## 2019-09-19 NOTE — Progress Notes (Signed)
Patient ID: Breanna Obrien, female    DOB: 1956-05-05  MRN: [id number]  CC: Diabetes and Hypertension   Subjective: Breanna Obrien is a 64 y.o. 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  She has received the COVID-19 vaccine already.  DIABETES TYPE 2 Last A1C:   Results for orders placed or performed in visit on 09/19/19  POCT glucose (manual entry)  Result Value Ref Range   POC Glucose 229 (A) 70 - 99 mg/dl    Lab Results  Component Value Date   HGBA1C 7.2 (H) 07/25/2019  Med Adherence:  [x]  Yes - Metformin, Farxiga and Levimier 23 units    []  No Medication side effects:  []  Yes    [x]  No Home Monitoring?  [x]  Yes.  BS this a.m before breakfast was 161.  Most mornings 120-140.  After meals usually less than 160.   Home glucose results range:reports BS were up and done for the past several months but started to level out over the past mth.  Few mths have been stressful; they have been trying to find a place to live Diet Adherence:  Back on track for the last mth Exercise: [x]  Yes -went to [place] to visit grandd-daughter.  Walked 1.5 miles 2 x a wk with her to school.  She plans to start walking in the mornings 2-3 days a wk   Hypoglycemic episodes?: []  Yes    [x]  No Numbness of the feet? []  Yes    [x]  No Retinopathy hx? []  Yes    []  No Last eye exam: Up-to-date Comments  HYPERTENSION/CAD/HL Currently taking: see medication list Med Adherence: [x]  Yes    []  No Medication side effects: []  Yes    [x]  No Adherence with salt restriction: [x]  Yes    []  No Home Monitoring?: [x]  Yes    []  No Monitoring Frequency: [x]  Yes daily.  Been good   []  No Home BP results range: 123/68 this morning SOB? []  Yes    [x]  No Chest Pain?: []  Yes    [x]  No Leg swelling?: []   Yes    [x]  No Headaches?: []  Yes    [x]  No Dizziness? []  Yes    [x]  No Comments:   Patient Active Problem List   Diagnosis Date Noted  . Abnormal mammogram 01/15/2017  . Coronary artery disease involving native coronary artery of native heart without angina pectoris 01/15/2017  . Hx of Bell's palsy 01/15/2017  . Insomnia due to medical condition 10/24/2016  . Depression 10/24/2016  . Type 2 diabetes mellitus with complication, without long-term current use of insulin ([place]) 10/06/2016  . Essential hypertension 10/06/2016  . Ataxia   . Hyperlipidemia   . Bilateral carotid artery stenosis   . CVA (cerebral vascular accident) ([place]) 10/05/2016     Current Outpatient Medications on File Prior to Visit  Medication Sig Dispense Refill  . albuterol (VENTOLIN HFA) 108 (90 Base) MCG/ACT inhaler Inhale 2 puffs into the lungs every 6 (six) hours as needed for wheezing or shortness of breath. 6.7 g 0  . amLODipine (NORVASC) 10 MG tablet Take 1 tablet (10 mg total) by mouth daily. 90 tablet 3  . atenolol (TENORMIN) 100 MG tablet Take 1 tablet (100 mg total) by mouth every evening.  90 tablet 3  . atorvastatin (LIPITOR) 40 MG tablet Take 1 tablet (40 mg total) by mouth daily at 6 PM. 90 tablet 3  . calcium-vitamin D (OSCAL WITH D) 500-200 MG-UNIT tablet Take 1 tablet by mouth.    . clopidogrel (PLAVIX) 75 MG tablet Take 1 tablet (75 mg total) by mouth daily. 90 tablet 3  . dapagliflozin propanediol (FARXIGA) 10 MG TABS tablet Take 10 mg by mouth daily. 90 tablet 3  . fluticasone (FLONASE) 50 MCG/ACT nasal spray Place 1 spray into both nostrils daily as needed for allergies or rhinitis. 16 g 0  . glipiZIDE (GLUCOTROL) 10 MG tablet TAKE 1 TABLET BY MOUTH TWICE DAILY BEFORE A MEAL 60 tablet 6  . hydrochlorothiazide (HYDRODIURIL) 25 MG tablet Take 1 tablet (25 mg total) by mouth daily. 90 tablet 3  . insulin detemir (LEVEMIR) 100 UNIT/ML injection Inject 0.23 mLs (23 Units total) into the skin at bedtime.  10 mL 6  . Insulin Syringe-Needle U-100 (INSULIN SYRINGE .5CC/30GX1/2") 30G X 1/2" 0.5 ML MISC Use as directed 100 each 6  . loratadine (CLARITIN) 10 MG tablet Take 1 tablet (10 mg total) by mouth daily. 30 tablet 0  . losartan (COZAAR) 100 MG tablet Take 1 tablet (100 mg total) by mouth daily. 90 tablet 3  . metFORMIN (GLUCOPHAGE) 1000 MG tablet Take 1 tablet (1,000 mg total) by mouth 2 (two) times daily. 180 tablet 3  . pneumococcal 23 valent vaccine (PNEUMOVAX 23) 25 MCG/0.5ML injection To be administered by the pharmacist 0.5 mL 0   No current facility-administered medications on file prior to visit.    Allergies  Allergen Reactions  . Trazodone And Nefazodone     Makes her feel jittery    Social History   Socioeconomic History  . Marital status: Married    Spouse name: Not on file  . Number of children: Not on file  . Years of education: Not on file  . Highest education level: Not on file  Occupational History  . Not on file  Tobacco Use  . Smoking status: Never Smoker  . Smokeless tobacco: Never Used  Substance and Sexual Activity  . Alcohol use: No  . Drug use: No  . Sexual activity: Yes    Birth control/protection: None  Other Topics Concern  . Not on file  Social History Narrative  . Not on file   Social Determinants of Health   Financial Resource Strain:   . Difficulty of Paying Living Expenses:   Food Insecurity:   . Worried About Charity fundraiser in the Last Year:   . Arboriculturist in the Last Year:   Transportation Needs:   . Film/video editor (Medical):   Marland Kitchen Lack of Transportation (Non-Medical):   Physical Activity:   . Days of Exercise per Week:   . Minutes of Exercise per Session:   Stress:   . Feeling of Stress :   Social Connections:   . Frequency of Communication with Friends and Family:   . Frequency of Social Gatherings with Friends and Family:   . Attends Religious Services:   . Active Member of Clubs or Organizations:   .  Attends Archivist Meetings:   Marland Kitchen Marital Status:   Intimate Partner Violence:   . Fear of Current or Ex-Partner:   . Emotionally Abused:   Marland Kitchen Physically Abused:   . Sexually Abused:     Family History  Problem Relation Age of Onset  .  Cancer Mother     Past Surgical History:  Procedure Laterality Date  . CHOLECYSTECTOMY    . CORONARY ANGIOPLASTY WITH STENT PLACEMENT      ROS: Review of Systems Negative except as stated above  PHYSICAL EXAM: BP 131/73   Pulse 72   Temp (!) 96.4 F (35.8 C)   Resp 16   Wt 179 lb 12.8 oz (81.6 kg)   SpO2 99%   BMI 31.85 kg/m   Wt Readings from Last 3 Encounters:  09/19/19 179 lb 12.8 oz (81.6 kg)  03/27/19 178 lb 3.2 oz (80.8 kg)  05/14/18 183 lb 12.8 oz (83.4 kg)    Physical Exam  General appearance - alert, well appearing, older Hispanic female and in no distress Mental status - normal mood, behavior, speech, dress, motor activity, and thought processes Nose - normal and patent, no erythema, discharge or polyps Mouth - mucous membranes moist, pharynx normal without lesions Neck - supple, no significant adenopathy Chest - clear to auscultation, no wheezes, rales or rhonchi, symmetric air entry Heart - normal rate, regular rhythm, normal S1, S2, no murmurs, rubs, clicks or gallops Extremities - peripheral pulses normal, no pedal edema, no clubbing or cyanosis Diabetic Foot Exam - Simple   Simple Foot Form Visual Inspection No deformities, no ulcerations, no other skin breakdown bilaterally: Yes Sensation Testing Intact to touch and monofilament testing bilaterally: Yes Pulse Check Posterior Tibialis and Dorsalis pulse intact bilaterally: Yes Comments      CMP Latest Ref Rng & Units 07/25/2019 05/14/2018 05/17/2017  Glucose 65 - 99 mg/dL 112(H) 168(H) 175(H)  BUN 8 - 27 mg/dL 16 18 18   Creatinine 0.57 - 1.00 mg/dL 0.99 1.05(H) 1.00  Sodium 134 - 144 mmol/L 138 138 140  Potassium 3.5 - 5.2 mmol/L 4.2 4.2 4.4    Chloride 96 - 106 mmol/L 98 97 100  CO2 20 - 29 mmol/L 22 23 23   Calcium 8.7 - 10.3 mg/dL 10.0 10.0 9.8  Total Protein 6.0 - 8.5 g/dL 7.7 7.4 7.5  Total Bilirubin 0.0 - 1.2 mg/dL 0.3 0.3 0.3  Alkaline Phos 39 - 117 IU/L 87 92 97  AST 0 - 40 IU/L 26 26 27   ALT 0 - 32 IU/L 37(H) 36(H) 36(H)   Lipid Panel     Component Value Date/Time   CHOL 134 07/25/2019 1524   TRIG 158 (H) 07/25/2019 1524   HDL 42 07/25/2019 1524   CHOLHDL 3.2 07/25/2019 1524   CHOLHDL 5.1 10/06/2016 0333   VLDL 31 10/06/2016 0333   LDLCALC 65 07/25/2019 1524    CBC    Component Value Date/Time   WBC 6.0 07/25/2019 1524   WBC 6.4 10/07/2016 0317   RBC 4.25 07/25/2019 1524   RBC 4.12 10/07/2016 0317   HGB 12.9 07/25/2019 1524   HCT 38.3 07/25/2019 1524   PLT 338 07/25/2019 1524   MCV 90 07/25/2019 1524   MCH 30.4 07/25/2019 1524   MCH 31.3 10/07/2016 0317   MCHC 33.7 07/25/2019 1524   MCHC 33.8 10/07/2016 0317   RDW 13.4 07/25/2019 1524   LYMPHSABS 1.2 10/05/2016 1203   MONOABS 0.3 10/05/2016 1203   EOSABS 0.2 10/05/2016 1203   BASOSABS 0.0 10/05/2016 1203    ASSESSMENT AND PLAN:  1. Controlled type 2 diabetes mellitus with other circulatory complication, with long-term current use of insulin ([place]) Reported home blood sugar readings are at goal. Encourage healthy eating habits.  Encouraged her to start her exercise routine as she has planned.  Continue Metformin, Farxiga and Levemir. - POCT glucose (manual entry)  2. Essential hypertension Close to goal.  Continue amlodipine, atenolol, hydrochlorothiazide  3. Hyperlipidemia associated with type 2 diabetes mellitus (HCC) Continue Lipitor  4. Coronary artery disease involving native coronary artery of native heart without angina pectoris Clinically stable.  Continue Lipitor, Plavix, atenolol  5. Class 1 obesity due to excess calories with serious comorbidity and body mass index (BMI) of 31.0 to 31.9 in adult See #1 above  6. Colon cancer  screen Asked the lab to check to see if we receive the results of her fit test   Patient was given the opportunity to ask questions.  Patient verbalized understanding of the plan and was able to repeat key elements of the plan.   Orders Placed This Encounter  Procedures  . POCT glucose (manual entry)     Requested Prescriptions    No prescriptions requested or ordered in this encounter    Return in about 4 months (around 01/19/2020).  Karle Plumber, MD, FACP

## 2019-09-21 ENCOUNTER — Encounter: Payer: Self-pay | Admitting: Internal Medicine

## 2019-09-24 ENCOUNTER — Other Ambulatory Visit: Payer: Self-pay | Admitting: Pharmacist

## 2019-09-24 DIAGNOSIS — E1159 Type 2 diabetes mellitus with other circulatory complications: Secondary | ICD-10-CM

## 2019-09-24 DIAGNOSIS — Z794 Long term (current) use of insulin: Secondary | ICD-10-CM

## 2019-09-24 MED ORDER — FARXIGA 10 MG PO TABS: 10.0000 mg | ORAL_TABLET | Freq: Every day | ORAL | 3 refills | Status: DC

## 2019-09-25 ENCOUNTER — Other Ambulatory Visit: Payer: Self-pay | Admitting: Internal Medicine

## 2019-09-25 DIAGNOSIS — Z794 Long term (current) use of insulin: Secondary | ICD-10-CM

## 2019-09-25 DIAGNOSIS — E1159 Type 2 diabetes mellitus with other circulatory complications: Secondary | ICD-10-CM

## 2019-09-25 DIAGNOSIS — I10 Essential (primary) hypertension: Secondary | ICD-10-CM

## 2019-09-25 MED FILL — HYDROCHLOROTHIAZIDE 25 MG T: 25 | 90 days supply | Qty: 90 | Fill #0

## 2019-09-25 MED FILL — metFORMIN HCL 1000 MG TABS: 1000 | 30 days supply | Qty: 60 | Fill #0

## 2019-09-25 MED FILL — ATENOLOL 100 MG TABLET: 100 | 30 days supply | Qty: 30 | Fill #0

## 2019-09-25 MED FILL — ?CLOPIDOGREL 75MG TA: 75 | 30 days supply | Qty: 30 | Fill #0

## 2019-09-25 MED FILL — AMLODIPINE BESYLATE 10 MG T: 10 | 30 days supply | Qty: 30 | Fill #0

## 2019-09-25 MED FILL — ?GLIPIZIDE 10 MG TABLET: 10 | 30 days supply | Qty: 60 | Fill #0

## 2019-09-25 MED FILL — ?ATORVASTATIN 40MG TABLET: 40 | 30 days supply | Qty: 30 | Fill #0

## 2019-10-13 ENCOUNTER — Other Ambulatory Visit: Payer: Self-pay | Admitting: Internal Medicine

## 2019-10-13 ENCOUNTER — Other Ambulatory Visit: Payer: Self-pay | Admitting: Pharmacist

## 2019-10-13 MED ORDER — "INSULIN SYRINGE 30G X 5/16"" 0.5 ML MISC": 6 refills | Status: DC

## 2019-10-13 MED FILL — TRUEPLUS SYR 0.5ML 30GX5/16: 30G X 5/16" | 30 days supply | Qty: 100 | Fill #0

## 2019-10-28 ENCOUNTER — Other Ambulatory Visit: Payer: Self-pay | Admitting: Internal Medicine

## 2019-10-28 DIAGNOSIS — E1159 Type 2 diabetes mellitus with other circulatory complications: Secondary | ICD-10-CM

## 2019-10-28 MED FILL — ?CLOPIDOGREL 75MG TA: 75 | 30 days supply | Qty: 30 | Fill #1

## 2019-10-28 MED FILL — metFORMIN HCL 1000 MG TABS: 1000 | 30 days supply | Qty: 60 | Fill #1

## 2019-10-28 MED FILL — ATENOLOL 100 MG TABLET: 100 | 30 days supply | Qty: 30 | Fill #1

## 2019-10-29 ENCOUNTER — Encounter: Payer: Self-pay | Admitting: Internal Medicine

## 2019-10-29 ENCOUNTER — Other Ambulatory Visit: Payer: Self-pay

## 2019-10-29 DIAGNOSIS — Z794 Long term (current) use of insulin: Secondary | ICD-10-CM

## 2019-10-29 MED ORDER — GLIPIZIDE 10 MG PO TABS: ORAL_TABLET | ORAL | 0 refills | Status: DC

## 2019-10-30 MED FILL — ?GLIPIZIDE 10 MG TABLET: 10 | 30 days supply | Qty: 60 | Fill #0

## 2019-11-24 ENCOUNTER — Other Ambulatory Visit: Payer: Self-pay | Admitting: Internal Medicine

## 2019-11-24 DIAGNOSIS — I10 Essential (primary) hypertension: Secondary | ICD-10-CM

## 2019-11-24 MED FILL — ?ATORVASTATIN 40MG TABLET: 40 | 30 days supply | Qty: 30 | Fill #1

## 2019-11-24 MED FILL — ATENOLOL 100 MG TABLET: 100 | 30 days supply | Qty: 30 | Fill #2

## 2019-11-24 MED FILL — ?GLIPIZIDE 10 MG TABLET: 10 | 30 days supply | Qty: 60 | Fill #1

## 2019-11-24 MED FILL — metFORMIN HCL 1000 MG TABS: 1000 | 30 days supply | Qty: 60 | Fill #2

## 2019-11-24 MED FILL — ?CLOPIDOGREL 75MG TA: 75 | 30 days supply | Qty: 30 | Fill #2

## 2019-11-25 MED FILL — LOSARTAN POTASSIUM 100 MG T: 100 | 30 days supply | Qty: 30 | Fill #0

## 2019-11-25 MED FILL — $LEVEMIR 100U/ML VIAL: 100 | 84 days supply | Qty: 20 | Fill #0

## 2019-12-02 MED FILL — ?AMLODIPINE BESYL 10MG TABL: 10 | 30 days supply | Qty: 30 | Fill #0

## 2019-12-05 MED FILL — ?AMLODIPINE BESYL 10MG TABL: 10 | 30 days supply | Qty: 30 | Fill #1

## 2019-12-25 MED FILL — ATORVASTATIN CALCIUM 40 MG: 40 | 30 days supply | Qty: 30 | Fill #2

## 2019-12-25 MED FILL — ?GLIPIZIDE 10 MG TABLET: 10 | 30 days supply | Qty: 60 | Fill #2

## 2019-12-25 MED FILL — LOSARTAN POTASSIUM 100 MG T: 100 | 30 days supply | Qty: 30 | Fill #1

## 2019-12-25 MED FILL — metFORMIN HCL 1000 MG TABS: 1000 | 30 days supply | Qty: 60 | Fill #3

## 2019-12-25 MED FILL — ATENOLOL 100 MG TABLET: 100 | 30 days supply | Qty: 30 | Fill #3

## 2019-12-25 MED FILL — ?CLOPIDOGREL 75MG TA: 75 | 30 days supply | Qty: 30 | Fill #3

## 2020-01-01 MED FILL — ?AMLODIPINE BESYL 10MG TABL: 10 | 30 days supply | Qty: 30 | Fill #2

## 2020-01-01 MED FILL — HYDROCHLOROTHIAZIDE 25 MG T: 25 | 30 days supply | Qty: 30 | Fill #1

## 2020-01-05 ENCOUNTER — Ambulatory Visit: Payer: Self-pay | Attending: Internal Medicine

## 2020-01-05 ENCOUNTER — Other Ambulatory Visit: Payer: Self-pay

## 2020-01-20 ENCOUNTER — Other Ambulatory Visit: Payer: Self-pay

## 2020-01-20 ENCOUNTER — Ambulatory Visit: Payer: Self-pay | Admitting: Internal Medicine

## 2020-01-20 ENCOUNTER — Encounter: Payer: Self-pay | Admitting: Internal Medicine

## 2020-01-20 ENCOUNTER — Other Ambulatory Visit: Payer: Self-pay | Admitting: Internal Medicine

## 2020-01-20 VITALS — BP 116/70 | Ht 63.0 in

## 2020-01-20 DIAGNOSIS — Z794 Long term (current) use of insulin: Secondary | ICD-10-CM

## 2020-01-20 DIAGNOSIS — I10 Essential (primary) hypertension: Secondary | ICD-10-CM

## 2020-01-20 DIAGNOSIS — Z23 Encounter for immunization: Secondary | ICD-10-CM

## 2020-01-20 DIAGNOSIS — Z1211 Encounter for screening for malignant neoplasm of colon: Secondary | ICD-10-CM

## 2020-01-20 DIAGNOSIS — I2510 Atherosclerotic heart disease of native coronary artery without angina pectoris: Secondary | ICD-10-CM

## 2020-01-20 DIAGNOSIS — E1159 Type 2 diabetes mellitus with other circulatory complications: Secondary | ICD-10-CM

## 2020-01-20 MED ORDER — ATENOLOL 100 MG PO TABS: 100.0000 mg | ORAL_TABLET | Freq: Every evening | ORAL | 3 refills | Status: DC

## 2020-01-20 MED ORDER — AMLODIPINE BESYLATE 10 MG PO TABS: 10.0000 mg | ORAL_TABLET | Freq: Every day | ORAL | 3 refills | Status: DC

## 2020-01-20 MED ORDER — METFORMIN HCL 1000 MG PO TABS: 1000.0000 mg | ORAL_TABLET | Freq: Two times a day (BID) | ORAL | 3 refills | Status: DC

## 2020-01-20 MED ORDER — LOSARTAN POTASSIUM 100 MG PO TABS: 100.0000 mg | ORAL_TABLET | Freq: Every day | ORAL | 3 refills | Status: DC

## 2020-01-20 MED ORDER — DAPAGLIFLOZIN PROPANEDIOL 10 MG PO TABS: 10.0000 mg | ORAL_TABLET | Freq: Every day | ORAL | 3 refills | Status: DC

## 2020-01-20 MED ORDER — INSULIN DETEMIR 100 UNIT/ML ~~LOC~~ SOLN: 26.0000 [IU] | Freq: Every day | SUBCUTANEOUS | 6 refills | Status: DC

## 2020-01-20 MED ORDER — GLIPIZIDE 10 MG PO TABS: ORAL_TABLET | ORAL | 3 refills | Status: DC

## 2020-01-20 MED FILL — HYDROCHLOROTHIAZIDE 25 MG T: 25 | 90 days supply | Qty: 90 | Fill #2

## 2020-01-20 MED FILL — metFORMIN HCL 1000 MG TABS: 1000 | 90 days supply | Qty: 180 | Fill #4

## 2020-01-20 MED FILL — AMLODIPINE BESYLATE 10 MG T: 10 | 90 days supply | Qty: 90 | Fill #3

## 2020-01-20 MED FILL — CLOPIDOGREL 75 MG TABLET: 75 | 90 days supply | Qty: 90 | Fill #4

## 2020-01-20 MED FILL — ATENOLOL 100 MG TABLET: 100 | 90 days supply | Qty: 90 | Fill #0

## 2020-01-20 MED FILL — glipiZIDE 10 MG TABS: 10 | 90 days supply | Qty: 180 | Fill #0

## 2020-01-20 MED FILL — LOSARTAN POTASSIUM 100 MG T: 100 | 90 days supply | Qty: 90 | Fill #0

## 2020-01-20 MED FILL — ATORVASTATIN CALCIUM 40 MG: 40 | 90 days supply | Qty: 90 | Fill #3

## 2020-01-20 NOTE — Progress Notes (Signed)
Virtual Visit via Telephone Note Due to current restrictions/limitations of in-office visits due to the COVID-19 pandemic, this scheduled clinical appointment was converted to a telehealth visit  I connected with Breanna Obrien on 01/20/20 at 9:25 a.m by telephone and verified that I am speaking with the correct person using two identifiers. I am in my office.  The patient is at home.  Only the patient and myself participated in this encounter.  I discussed the limitations, risks, security and privacy concerns of performing an evaluation and management service by telephone and the availability of in person appointments. I also discussed with the patient that there may be a patient responsible charge related to this service. The patient expressed understanding and agreed to proceed.   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  Last seen 4/21.  Today's visit is for chronic disease management.  DIABETES TYPE 2 Last A1C:   Results for orders placed or performed in visit on 09/19/19  POCT glucose (manual entry)  Result Value Ref Range   POC Glucose 229 (A) 70 - 99 mg/dl    Med Adherence:  _0  Yes -Levemir 24 units daily, Metformin, Glucotrol and Farxiga Medication side effects:  _1  Yes    _2  No Home Monitoring?  _3  Yes    _4  No Home glucose results range: reports BS "are up and down." Checking BS before meals and sometime after.  Range before BF usually  114-226, 2 hrs post BF 125-208. Diet Adherence: _5  Yes - feels she is doing better.  Use to eat on the go but not any more.  Also doing better with portion control   Exercise: _6  Yes -walking 1 mile a day Hypoglycemic episodes?: _7  Yes    _8  No Numbness of the feet? _9  Yes    _10  No Retinopathy hx? _11  Yes    _12  No Last eye exam: up to date Comments:   HYPERTENSION/CAD/HL Currently taking: see medication list.   Reports compliance with atenolol, Cozaar, HCTZ, Lipitor and Plavix. Med Adherence: _13  Yes    _14  No Medication side effects: _15  Yes    _16  No Adherence with salt restriction: _17  Yes    _18  No Home Monitoring?: _19  Yes    _20  No Monitoring Frequency:  3-4 days a wk Home BP results range: 116/70 this a.m SOB? _21  Yes    _22  No Chest Pain?: _23  Yes    _24  No Leg swelling?: _25  Yes    _26  No Headaches?: _27  Yes    _28  No Dizziness? _29  Yes    _30  No Comments:   HM: [facility] did not receive FIT.   Due for flu shot and A1C . Outpatient Encounter Medications as of 01/20/2020  Medication Sig  . albuterol (VENTOLIN HFA) 108 (90 Base) MCG/ACT inhaler Inhale 2 puffs into the lungs every 6 (six) hours as needed for wheezing or shortness of breath.  Marland Kitchen amLODipine (NORVASC) 10 MG tablet Take 1 tablet (10 mg total) by mouth daily.  Marland Kitchen atenolol (TENORMIN) 100 MG tablet TAKE 1 TABLET BY MOUTH EVERY EVENING.  Marland Kitchen atorvastatin (LIPITOR) 40 MG tablet Take 1 tablet (40 mg total) by mouth daily at 6 PM.  . calcium-vitamin D (OSCAL WITH D) 500-200 MG-UNIT tablet Take 1 tablet by mouth.  . clopidogrel (PLAVIX) 75 MG tablet Take 1 tablet (75 mg total) by mouth daily.  . dapagliflozin propanediol (FARXIGA)  10 MG TABS tablet Take 10 mg by mouth daily.  . fluticasone (FLONASE) 50 MCG/ACT nasal spray Place 1 spray into both nostrils daily as needed for allergies or rhinitis.  Marland Kitchen glipiZIDE (GLUCOTROL) 10 MG tablet TAKE 1 TABLET BY MOUTH TWICE DAILY BEFORE A MEAL  . hydrochlorothiazide (HYDRODIURIL) 25 MG tablet Take 1 tablet (25 mg total) by mouth daily.  . insulin detemir (LEVEMIR) 100 UNIT/ML injection Inject 0.23 mLs (23 Units total) into the skin at bedtime.  . Insulin Syringe-Needle U-100 (INSULIN SYRINGE .5CC/30GX1/2") 30G X 1/2" 0.5 ML MISC Use as directed to inject insulin.  Marland Kitchen loratadine (CLARITIN) 10 MG tablet Take 1 tablet (10 mg total) by mouth daily.  Marland Kitchen losartan (COZAAR) 100 MG tablet TAKE 1 TABLET BY MOUTH DAILY.   . metFORMIN (GLUCOPHAGE) 1000 MG tablet Take 1 tablet (1,000 mg total) by mouth 2 (two) times daily.  . pneumococcal 23 valent vaccine (PNEUMOVAX 23) 25 MCG/0.5ML injection To be administered by the pharmacist   No facility-administered encounter medications on file as of 01/20/2020.      Observations/Objective: Lab Results  Component Value Date   CHOL 134 07/25/2019   HDL 42 07/25/2019   LDLCALC 65 07/25/2019   TRIG 158 (H) 07/25/2019   CHOLHDL 3.2 07/25/2019   Lab Results  Component Value Date   WBC 6.0 07/25/2019   HGB 12.9 07/25/2019   HCT 38.3 07/25/2019   MCV 90 07/25/2019   PLT 338 07/25/2019     Assessment and Plan: 1. Controlled type 2 diabetes mellitus with other circulatory complication, with long-term current use of insulin (HCC) Blood sugars not quite at goal.  Goal for blood sugars before meals is 90-130 and 2 hours after meal less than 180.  I recommend increasing Levemir to 26 units daily.  Continue other oral medicines.  Commended her on changes in her eating habits.  Encouraged her to keep up the good works.  Continue regular exercise.  She will come to the lab to have an A1c checked - Hemoglobin A1c; Future - glipiZIDE (GLUCOTROL) 10 MG tablet; TAKE 1 TABLET BY MOUTH TWICE DAILY BEFORE A MEAL  Dispense: 180 tablet; Refill: 3 - metFORMIN (GLUCOPHAGE) 1000 MG tablet; Take 1 tablet (1,000 mg total) by mouth 2 (two) times daily.  Dispense: 180 tablet; Refill: 3 - dapagliflozin propanediol (FARXIGA) 10 MG TABS tablet; Take 1 tablet (10 mg total) by mouth daily.  Dispense: 90 tablet; Refill: 3  2. Essential hypertension At goal.  Continue current medications and low-salt diet - atenolol (TENORMIN) 100 MG tablet; Take 1 tablet (100 mg total) by mouth every evening.  Dispense: 90 tablet; Refill: 3 - losartan (COZAAR) 100 MG tablet; Take 1 tablet (100 mg total) by mouth daily.  Dispense: 90 tablet; Refill: 3 - amLODipine (NORVASC) 10 MG tablet; Take 1 tablet (10 mg  total) by mouth daily.  Dispense: 90 tablet; Refill: 3  3. Coronary artery disease involving native coronary artery of native heart without angina pectoris Doing well.  Continue beta-blocker, Plavix, and atorvastatin.  4. Need for influenza vaccination She will come to see our clinical pharmacist next week for flu shot  5. Screening for colon cancer She will pick up the kit for the fit test when she comes in to get the flu shot.  I advised that she try to give a stool sample while she is here and turn it into the lab directly so that she does not have to mail it.  Sometimes we have not  been getting the results for samples that have been mailed in. - Fecal occult blood, imunochemical([facility])   Follow Up Instructions: 4 mths   I discussed the assessment and treatment plan with the patient. The patient was provided an opportunity to ask questions and all were answered. The patient agreed with the plan and demonstrated an understanding of the instructions.   The patient was advised to call back or seek an in-person evaluation if the symptoms worsen or if the condition fails to improve as anticipated.  I provided  16 mins minutes of non-face-to-face time during this encounter.   Karle Plumber, MD

## 2020-01-20 NOTE — Progress Notes (Signed)
Pt states her blood sugar was 158

## 2020-01-21 MED FILL — TRUEPLUS SYR 0.5ML 30GX5/16: 30G X 5/16" | 30 days supply | Qty: 100 | Fill #1

## 2020-01-23 ENCOUNTER — Other Ambulatory Visit: Payer: Self-pay

## 2020-01-23 ENCOUNTER — Ambulatory Visit: Payer: No Typology Code available for payment source

## 2020-01-23 ENCOUNTER — Ambulatory Visit: Payer: Self-pay

## 2020-02-03 MED FILL — $LEVEMIR 100U/ML VIAL: 100 | 38 days supply | Qty: 10 | Fill #1

## 2020-02-03 MED FILL — $LEVEMIR 100U/ML VIAL: 100 | 38 days supply | Qty: 10 | Fill #0

## 2020-02-04 ENCOUNTER — Ambulatory Visit: Payer: Medicaid Other | Attending: Family Medicine | Admitting: Pharmacist

## 2020-02-04 ENCOUNTER — Other Ambulatory Visit: Payer: Self-pay

## 2020-02-04 DIAGNOSIS — Z23 Encounter for immunization: Secondary | ICD-10-CM

## 2020-02-04 NOTE — Progress Notes (Signed)
Patient presents for vaccination against influenza per orders of Dr. Johnson. Consent given. Counseling provided. No contraindications exists. Vaccine administered without incident.  ° °Luke Van Ausdall, PharmD, CPP °Clinical Pharmacist °[facility] °[phone] ° °

## 2020-03-15 MED FILL — $LEVEMIR 100U/ML VIAL: 100 | 38 days supply | Qty: 10 | Fill #2

## 2020-04-19 ENCOUNTER — Other Ambulatory Visit: Payer: Self-pay

## 2020-04-19 ENCOUNTER — Ambulatory Visit: Payer: Medicaid Other | Attending: Internal Medicine

## 2020-04-19 DIAGNOSIS — Z794 Long term (current) use of insulin: Secondary | ICD-10-CM

## 2020-04-19 MED FILL — glipiZIDE 10 MG TABS: 10 | 90 days supply | Qty: 180 | Fill #1

## 2020-04-19 MED FILL — LOSARTAN POTASSIUM 100 MG T: 100 | 90 days supply | Qty: 90 | Fill #1

## 2020-04-19 MED FILL — HYDROCHLOROTHIAZIDE 25 MG T: 25 | 90 days supply | Qty: 90 | Fill #3

## 2020-04-19 MED FILL — METFORMIN HCL 1000 MG TABS: 1000 | 90 days supply | Qty: 180 | Fill #5

## 2020-04-19 MED FILL — ATENOLOL 100 MG TABLET: 100 | 90 days supply | Qty: 90 | Fill #1

## 2020-04-19 MED FILL — AMLODIPINE BESYLATE 10 MG T: 10 | 90 days supply | Qty: 90 | Fill #4

## 2020-04-19 MED FILL — TRUEPLUS SYR 0.5ML 30GX5/16: 30G X 5/16" | 30 days supply | Qty: 100 | Fill #2

## 2020-04-19 MED FILL — $LEVEMIR 100U/ML VIAL: 100 | 38 days supply | Qty: 10 | Fill #3

## 2020-04-19 MED FILL — CLOPIDOGREL 75 MG TABLET: 75 | 90 days supply | Qty: 90 | Fill #5

## 2020-04-20 LAB — HEMOGLOBIN A1C
Est. average glucose Bld gHb Est-mCnc: 177 mg/dL
Hgb A1c MFr Bld: 7.8 % — ABNORMAL HIGH (ref 4.8–5.6)

## 2020-05-20 ENCOUNTER — Other Ambulatory Visit: Payer: Self-pay

## 2020-05-20 ENCOUNTER — Encounter: Payer: Self-pay | Admitting: Internal Medicine

## 2020-05-20 ENCOUNTER — Ambulatory Visit: Payer: Medicaid Other | Attending: Internal Medicine | Admitting: Internal Medicine

## 2020-05-20 VITALS — BP 130/78 | HR 68 | Temp 98.5°F | Resp 16 | Wt 194.8 lb

## 2020-05-20 DIAGNOSIS — I2510 Atherosclerotic heart disease of native coronary artery without angina pectoris: Secondary | ICD-10-CM

## 2020-05-20 DIAGNOSIS — M25571 Pain in right ankle and joints of right foot: Secondary | ICD-10-CM

## 2020-05-20 DIAGNOSIS — Z794 Long term (current) use of insulin: Secondary | ICD-10-CM

## 2020-05-20 DIAGNOSIS — Z6831 Body mass index (BMI) 31.0-31.9, adult: Secondary | ICD-10-CM

## 2020-05-20 DIAGNOSIS — G8929 Other chronic pain: Secondary | ICD-10-CM

## 2020-05-20 DIAGNOSIS — E785 Hyperlipidemia, unspecified: Secondary | ICD-10-CM

## 2020-05-20 DIAGNOSIS — E1159 Type 2 diabetes mellitus with other circulatory complications: Secondary | ICD-10-CM

## 2020-05-20 DIAGNOSIS — I10 Essential (primary) hypertension: Secondary | ICD-10-CM

## 2020-05-20 DIAGNOSIS — Z7189 Other specified counseling: Secondary | ICD-10-CM

## 2020-05-20 DIAGNOSIS — E1169 Type 2 diabetes mellitus with other specified complication: Secondary | ICD-10-CM

## 2020-05-20 DIAGNOSIS — E6609 Other obesity due to excess calories: Secondary | ICD-10-CM

## 2020-05-20 DIAGNOSIS — Z1211 Encounter for screening for malignant neoplasm of colon: Secondary | ICD-10-CM

## 2020-05-20 LAB — GLUCOSE, POCT (MANUAL RESULT ENTRY): POC Glucose: 181 mg/dl — AB (ref 70–99)

## 2020-05-20 MED ORDER — INSULIN DETEMIR 100 UNIT/ML ~~LOC~~ SOLN
30.0000 [IU] | Freq: Every day | SUBCUTANEOUS | 6 refills | Status: DC
  Filled 2020-09-24: qty 10, 33d supply, fill #0
  Filled 2020-10-18: qty 10, 33d supply, fill #1
  Filled 2020-11-17: qty 10, 33d supply, fill #2
  Filled 2021-01-02: qty 10, 33d supply, fill #3

## 2020-05-20 MED FILL — $LEVEMIR 100U/ML VIAL: 100 | 30 days supply | Qty: 10 | Fill #0

## 2020-05-20 NOTE — Patient Instructions (Signed)
Increase your Levemir insulin to 30 units daily. Please be mindful of eating habits during the holiday season. Please come to the lab about a week before your next office visit to get your blood test done ahead of time.   Diabetes Mellitus and Nutrition, Adult When you have diabetes (diabetes mellitus), it is very important to have healthy eating habits because your blood sugar (glucose) levels are greatly affected by what you eat and drink. Eating healthy foods in the appropriate amounts, at about the same times every day, can help you:  Control your blood glucose.  Lower your risk of heart disease.  Improve your blood pressure.  Reach or maintain a healthy weight. Every person with diabetes is different, and each person has different needs for a meal plan. Your health care provider may recommend that you work with a diet and nutrition specialist (dietitian) to make a meal plan that is best for you. Your meal plan may vary depending on factors such as:  The calories you need.  The medicines you take.  Your weight.  Your blood glucose, blood pressure, and cholesterol levels.  Your activity level.  Other health conditions you have, such as heart or kidney disease. How do carbohydrates affect me? Carbohydrates, also called carbs, affect your blood glucose level more than any other type of food. Eating carbs naturally raises the amount of glucose in your blood. Carb counting is a method for keeping track of how many carbs you eat. Counting carbs is important to keep your blood glucose at a healthy level, especially if you use insulin or take certain oral diabetes medicines. It is important to know how many carbs you can safely have in each meal. This is different for every person. Your dietitian can help you calculate how many carbs you should have at each meal and for each snack. Foods that contain carbs include:  Bread, cereal, rice, pasta, and crackers.  Potatoes and corn.  Peas,  beans, and lentils.  Milk and yogurt.  Fruit and juice.  Desserts, such as cakes, cookies, ice cream, and candy. How does alcohol affect me? Alcohol can cause a sudden decrease in blood glucose (hypoglycemia), especially if you use insulin or take certain oral diabetes medicines. Hypoglycemia can be a life-threatening condition. Symptoms of hypoglycemia (sleepiness, dizziness, and confusion) are similar to symptoms of having too much alcohol. If your health care provider says that alcohol is safe for you, follow these guidelines:  Limit alcohol intake to no more than 1 drink per day for nonpregnant women and 2 drinks per day for men. One drink equals 12 oz of beer, 5 oz of wine, or 1 oz of hard liquor.  Do not drink on an empty stomach.  Keep yourself hydrated with water, diet soda, or unsweetened iced tea.  Keep in mind that regular soda, juice, and other mixers may contain a lot of sugar and must be counted as carbs. What are tips for following this plan?  Reading food labels  Start by checking the serving size on the "Nutrition Facts" label of packaged foods and drinks. The amount of calories, carbs, fats, and other nutrients listed on the label is based on one serving of the item. Many items contain more than one serving per package.  Check the total grams (g) of carbs in one serving. You can calculate the number of servings of carbs in one serving by dividing the total carbs by 15. For example, if a food has 30 g of  total carbs, it would be equal to 2 servings of carbs.  Check the number of grams (g) of saturated and trans fats in one serving. Choose foods that have low or no amount of these fats.  Check the number of milligrams (mg) of salt (sodium) in one serving. Most people should limit total sodium intake to less than 2,300 mg per day.  Always check the nutrition information of foods labeled as "low-fat" or "nonfat". These foods may be higher in added sugar or refined carbs  and should be avoided.  Talk to your dietitian to identify your daily goals for nutrients listed on the label. Shopping  Avoid buying canned, premade, or processed foods. These foods tend to be high in fat, sodium, and added sugar.  Shop around the outside edge of the grocery store. This includes fresh fruits and vegetables, bulk grains, fresh meats, and fresh dairy. Cooking  Use low-heat cooking methods, such as baking, instead of high-heat cooking methods like deep frying.  Cook using healthy oils, such as olive, canola, or sunflower oil.  Avoid cooking with butter, cream, or high-fat meats. Meal planning  Eat meals and snacks regularly, preferably at the same times every day. Avoid going long periods of time without eating.  Eat foods high in fiber, such as fresh fruits, vegetables, beans, and whole grains. Talk to your dietitian about how many servings of carbs you can eat at each meal.  Eat 4-6 ounces (oz) of lean protein each day, such as lean meat, chicken, fish, eggs, or tofu. One oz of lean protein is equal to: ? 1 oz of meat, chicken, or fish. ? 1 egg. ?  cup of tofu.  Eat some foods each day that contain healthy fats, such as avocado, nuts, seeds, and fish. Lifestyle  Check your blood glucose regularly.  Exercise regularly as told by your health care provider. This may include: ? 150 minutes of moderate-intensity or vigorous-intensity exercise each week. This could be brisk walking, biking, or water aerobics. ? Stretching and doing strength exercises, such as yoga or weightlifting, at least 2 times a week.  Take medicines as told by your health care provider.  Do not use any products that contain nicotine or tobacco, such as cigarettes and e-cigarettes. If you need help quitting, ask your health care provider.  Work with a Social worker or diabetes educator to identify strategies to manage stress and any emotional and social challenges. Questions to ask a health care  provider  Do I need to meet with a diabetes educator?  Do I need to meet with a dietitian?  What number can I call if I have questions?  When are the best times to check my blood glucose? Where to find more information:  American Diabetes Association: diabetes.org  Academy of Nutrition and Dietetics: www.eatright.[employer] of Diabetes and Digestive and Kidney Diseases (NIH): [employer] Summary  A healthy meal plan will help you control your blood glucose and maintain a healthy lifestyle.  Working with a diet and nutrition specialist (dietitian) can help you make a meal plan that is best for you.  Keep in mind that carbohydrates (carbs) and alcohol have immediate effects on your blood glucose levels. It is important to count carbs and to use alcohol carefully. This information is not intended to replace advice given to you by your health care provider. Make sure you discuss any questions you have with your health care provider. Document Revised: 04/27/2017 Document Reviewed: 06/19/2016 Elsevier Patient Education  [place].

## 2020-05-20 NOTE — Progress Notes (Signed)
Patient ID: Breanna Obrien, female    DOB: 11/06/1955  MRN: [id number]  CC: Diabetes and Hypertension   Subjective: Breanna Obrien is a 64 y.o. 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    DIABETES TYPE 2/Obesity Last A1C:   Results for orders placed or performed in visit on 05/20/20  POCT glucose (manual entry)  Result Value Ref Range   POC Glucose 181 (A) 70 - 99 mg/dl    Lab Results  Component Value Date   HGBA1C 7.8 (H) 04/19/2020   Med Adherence: [x] ? Yes -Levemir 24 units daily previously but she increased to 28 units after receiving results of recent A1C, also on Metformin, Glucotrol and Farxiga Medication side effects:  []  Yes    [x]  No Home Monitoring?  [x]  Yes  Before and after meals.  Does not have log or meter withher.  Home glucose results range: before meals 150-180, and 2 hrs after meals 160-235 Diet Adherence: a lot more difficult to stick with good eating habits during the holidays.  Doing a lot of baking Exercise: [x]  Yes  walking 2-4 days a wk.  Sometimes RT ankle hurts to extent where she has to stop walking.   Hypoglycemic episodes?: []  Yes    [x]  No Numbness of the feet? []  Yes    [x]  No but stinging pain below medial malleolus on root foot when walking Retinopathy hx? []  Yes    [x]  No Last eye exam: up to date Comments: wgh was 180 lbs when she left home. wgh today at office was 194.  She attributes this to having on several layers of clothing including a jacket and heavy boots which she did not take off when she was waiting.  She does not feel that she has had a change in clothes sizes.  HYPERTENSION/CAD Currently taking: see medication list.  She is on amlodipine, Cozaar, hydrochlorothiazide and atenolol Med Adherence: [x]  Yes    []  No Medication side effects: []  Yes    [x]   No Adherence with salt restriction: [x]  Yes    []  No Home Monitoring?: [x]  Yes    []  No Monitoring Frequency: 3-4 x a day Home BP results range:, 130/78 this a.m, yesterday was 128/78 SOB? []  Yes    [x]  No Chest Pain?: []  Yes    [x]  No Leg swelling?: []  Yes    [x]  No Headaches?: []  Yes    [x]  No Dizziness? []  Yes    [x]  No Comments:   HL:  Taking and tolerating Lipitor.   HM: debating whether to get  COVID booster.  Patient Active Problem List   Diagnosis Date Noted  . Abnormal mammogram 01/15/2017  . Coronary artery disease involving native coronary artery of native heart without angina pectoris 01/15/2017  . Hx of Bell's palsy 01/15/2017  . Insomnia due to medical condition 10/24/2016  . Depression 10/24/2016  . Type 2 diabetes mellitus with complication, without long-term current use of insulin (HCC) 10/06/2016  . Essential hypertension 10/06/2016  . Ataxia   . Hyperlipidemia   . Bilateral carotid artery stenosis   . CVA (cerebral vascular accident) (HCC) 10/05/2016     Current Outpatient Medications on File Prior to Visit  Medication Sig Dispense Refill  . albuterol (VENTOLIN HFA) 108 (90 Base) MCG/ACT inhaler Inhale 2 puffs into the lungs every  6 (six) hours as needed for wheezing or shortness of breath. 6.7 g 0  . amLODipine (NORVASC) 10 MG tablet Take 1 tablet (10 mg total) by mouth daily. 90 tablet 3  . atenolol (TENORMIN) 100 MG tablet Take 1 tablet (100 mg total) by mouth every evening. 90 tablet 3  . atorvastatin (LIPITOR) 40 MG tablet Take 1 tablet (40 mg total) by mouth daily at 6 PM. 90 tablet 3  . calcium-vitamin D (OSCAL WITH D) 500-200 MG-UNIT tablet Take 1 tablet by mouth.    . clopidogrel (PLAVIX) 75 MG tablet Take 1 tablet (75 mg total) by mouth daily. 90 tablet 3  . dapagliflozin propanediol (FARXIGA) 10 MG TABS tablet Take 1 tablet (10 mg total) by mouth daily. 90 tablet 3  . fluticasone (FLONASE) 50 MCG/ACT nasal spray Place 1 spray into both nostrils  daily as needed for allergies or rhinitis. 16 g 0  . glipiZIDE (GLUCOTROL) 10 MG tablet TAKE 1 TABLET BY MOUTH TWICE DAILY BEFORE A MEAL 180 tablet 3  . hydrochlorothiazide (HYDRODIURIL) 25 MG tablet Take 1 tablet (25 mg total) by mouth daily. 90 tablet 3  . Insulin Syringe-Needle U-100 (INSULIN SYRINGE .5CC/30GX1/2") 30G X 1/2" 0.5 ML MISC Use as directed to inject insulin. 100 each 6  . loratadine (CLARITIN) 10 MG tablet Take 1 tablet (10 mg total) by mouth daily. 30 tablet 0  . losartan (COZAAR) 100 MG tablet Take 1 tablet (100 mg total) by mouth daily. 90 tablet 3  . metFORMIN (GLUCOPHAGE) 1000 MG tablet Take 1 tablet (1,000 mg total) by mouth 2 (two) times daily. 180 tablet 3  . pneumococcal 23 valent vaccine (PNEUMOVAX 23) 25 MCG/0.5ML injection To be administered by the pharmacist 0.5 mL 0   No current facility-administered medications on file prior to visit.    Allergies  Allergen Reactions  . Trazodone And Nefazodone     Makes her feel jittery    Social History   Socioeconomic History  . Marital status: Married    Spouse name: Not on file  . Number of children: Not on file  . Years of education: Not on file  . Highest education level: Not on file  Occupational History  . Not on file  Tobacco Use  . Smoking status: Never Smoker  . Smokeless tobacco: Never Used  Vaping Use  . Vaping Use: Never used  Substance and Sexual Activity  . Alcohol use: No  . Drug use: No  . Sexual activity: Yes    Birth control/protection: None  Other Topics Concern  . Not on file  Social History Narrative  . Not on file   Social Determinants of Health   Financial Resource Strain: Not on file  Food Insecurity: Not on file  Transportation Needs: Not on file  Physical Activity: Not on file  Stress: Not on file  Social Connections: Not on file  Intimate Partner Violence: Not on file    Family History  Problem Relation Age of Onset  . Cancer Mother     Past Surgical History:   Procedure Laterality Date  . CHOLECYSTECTOMY    . CORONARY ANGIOPLASTY WITH STENT PLACEMENT      ROS: Review of Systems Negative except as stated above  PHYSICAL EXAM: BP 130/78   Pulse 68   Temp 98.5 F (36.9 C)   Resp 16   Wt 194 lb 12.8 oz (88.4 kg)   SpO2 99%   BMI 34.51 kg/m   Wt Readings from Last 3  Encounters:  05/20/20 194 lb 12.8 oz (88.4 kg)  09/19/19 179 lb 12.8 oz (81.6 kg)  03/27/19 178 lb 3.2 oz (80.8 kg)    Physical Exam  General appearance - alert, well appearing, and in no distress Mental status - normal mood, behavior, speech, dress, motor activity, and thought processes Neck - supple, no significant adenopathy Chest - clear to auscultation, no wheezes, rales or rhonchi, symmetric air entry Heart - normal rate, regular rhythm, normal S1, S2, no murmurs, rubs, clicks or gallops Musculoskeletal -right ankle: Slight edema below the medial malleolus.  No point tenderness.  Good range of motion  extremities - peripheral pulses normal, no pedal edema, no clubbing or cyanosis   CMP Latest Ref Rng & Units 07/25/2019 05/14/2018 05/17/2017  Glucose 65 - 99 mg/dL 109(N) 235(T) 732(K)  BUN 8 - 27 mg/dL 16 18 18   Creatinine 0.57 - 1.00 mg/dL 0.25) 4.27(C  Sodium 134 - 144 mmol/L 138 138 140  Potassium 3.5 - 5.2 mmol/L 4.2 4.2 4.4  Chloride 96 - 106 mmol/L 98 97 100  CO2 20 - 29 mmol/L 22 23 23   Calcium 8.7 - 10.3 mg/dL 6.23 9.8  Total Protein 6.0 - 8.5 g/dL 7.7 7.4 7.5  Total Bilirubin 0.0 - 1.2 mg/dL 0.3 0.3 0.3  Alkaline Phos 39 - 117 IU/L 87 92 97  AST 0 - 40 IU/L 26 26 27   ALT 0 - 32 IU/L 37(H) 36(H) 36(H)   Lipid Panel     Component Value Date/Time   CHOL 134 07/25/2019 1524   TRIG 158 (H) 07/25/2019 1524   HDL 42 07/25/2019 1524   CHOLHDL 3.2 07/25/2019 1524   CHOLHDL 5.1 10/06/2016 0333   VLDL 31 10/06/2016 0333   LDLCALC 65 07/25/2019 1524    CBC    Component Value Date/Time   WBC 6.0 07/25/2019 1524   WBC 6.4 10/07/2016 0317    RBC 4.25 07/25/2019 1524   RBC 4.12 10/07/2016 0317   HGB 12.9 07/25/2019 1524   HCT 38.3 07/25/2019 1524   PLT 338 07/25/2019 1524   MCV 90 07/25/2019 1524   MCH 30.4 07/25/2019 1524   MCH 31.3 10/07/2016 0317   MCHC 33.7 07/25/2019 1524   MCHC 33.8 10/07/2016 0317   RDW 13.4 07/25/2019 1524   LYMPHSABS 1.2 10/05/2016 1203   MONOABS 0.3 10/05/2016 1203   EOSABS 0.2 10/05/2016 1203   BASOSABS 0.0 10/05/2016 1203    ASSESSMENT AND PLAN: 1. Type 2 diabetes mellitus with other circulatory complication, with long-term current use of insulin (HCC) Not at goal.  We discussed healthy eating eating habits tips for the holidays and beyond.  Continue regular exercise.  Increase Levemir to 30 units daily.  Continue to monitor blood sugars - POCT glucose (manual entry) - CBC; Future - Comprehensive metabolic panel; Future - Lipid panel; Future - Microalbumin / creatinine urine ratio; Future - Hemoglobin A1c; Future - insulin detemir (LEVEMIR) 100 UNIT/ML injection; Inject 0.3 mLs (30 Units total) into the skin at bedtime.  Dispense: 10 mL; Refill: 6  2. Essential hypertension Home blood pressure readings are at goal.  Continue current medications and low-salt diet  3. Coronary artery disease involving native coronary artery of native heart without angina pectoris Stable.  Continue statin, beta-blocker and Plavix  4. Hyperlipidemia associated with type 2 diabetes mellitus (HCC) Continue atorvastatin  5. Class 1 obesity due to excess calories with serious comorbidity and body mass index (BMI) of 31.0 to 31.9 in adult See #  1 above.  Weight today does not reflect her true weight due to layers of clothing and heavy boots that she is wearing  6. Screening for colon cancer Now that she is here today she will get another fit test.  She will use it and bring it back personally to the lab since we have not been able to get it when she sends it in the mail  7. Chronic pain of right ankle -  Ambulatory referral to Podiatry  8. Educated about COVID-19 virus infection Encourage patient to get the [place] booster     Patient was given the opportunity to ask questions.  Patient verbalized understanding of the plan and was able to repeat key elements of the plan.   Orders Placed This Encounter  Procedures  . CBC  . Comprehensive metabolic panel  . Lipid panel  . Microalbumin / creatinine urine ratio  . Hemoglobin A1c  . Ambulatory referral to Podiatry  . POCT glucose (manual entry)     Requested Prescriptions   Signed Prescriptions Disp Refills  . insulin detemir (LEVEMIR) 100 UNIT/ML injection 10 mL 6    Sig: Inject 0.3 mLs (30 Units total) into the skin at bedtime.    Return in about 4 months (around 09/18/2020).  Karle Plumber, MD, FACP

## 2020-06-04 ENCOUNTER — Encounter: Payer: Self-pay | Admitting: Podiatry

## 2020-06-04 ENCOUNTER — Ambulatory Visit: Payer: No Typology Code available for payment source

## 2020-06-04 ENCOUNTER — Ambulatory Visit (INDEPENDENT_AMBULATORY_CARE_PROVIDER_SITE_OTHER): Payer: No Typology Code available for payment source | Admitting: Podiatry

## 2020-06-04 ENCOUNTER — Other Ambulatory Visit: Payer: Self-pay

## 2020-06-04 DIAGNOSIS — M76821 Posterior tibial tendinitis, right leg: Secondary | ICD-10-CM

## 2020-06-04 DIAGNOSIS — M25571 Pain in right ankle and joints of right foot: Secondary | ICD-10-CM

## 2020-06-08 ENCOUNTER — Encounter: Payer: Self-pay | Admitting: Podiatry

## 2020-06-08 NOTE — Progress Notes (Signed)
Subjective:  Patient ID: Breanna Obrien, female    DOB: 1955/12/25,  MRN: [id number]  Chief Complaint  Patient presents with  . Foot Pain    Pt stated that for over a year she has been having pain in her ankle and foot.     65 y.o. female presents with the above complaint.  Patient presents with a complaint of right medial foot pain.  Patient says been bothering her on and off for years.  Patient says there is a burning sensation associated.  He goes right in the middle of the inside of the foot.  It hurts when ambulating.  She has not seen anyone else prior to seeing me.  She would like to discuss was going on.  She has not tried any other conservative treatment options.  She has not made any shoe gear modification as well.  She denies any other acute complaints.   Review of Systems: Negative except as noted in the HPI. Denies N/V/F/Ch.  Past Medical History:  Diagnosis Date  . Diabetes mellitus without complication ([place])   . Hypertension     Current Outpatient Medications:  .  albuterol (VENTOLIN HFA) 108 (90 Base) MCG/ACT inhaler, Inhale 2 puffs into the lungs every 6 (six) hours as needed for wheezing or shortness of breath., Disp: 6.7 g, Rfl: 0 .  amLODipine (NORVASC) 10 MG tablet, Take 1 tablet (10 mg total) by mouth daily., Disp: 90 tablet, Rfl: 3 .  atenolol (TENORMIN) 100 MG tablet, Take 1 tablet (100 mg total) by mouth every evening., Disp: 90 tablet, Rfl: 3 .  atorvastatin (LIPITOR) 40 MG tablet, Take 1 tablet (40 mg total) by mouth daily at 6 PM., Disp: 90 tablet, Rfl: 3 .  calcium-vitamin D (OSCAL WITH D) 500-200 MG-UNIT tablet, Take 1 tablet by mouth., Disp: , Rfl:  .  clopidogrel (PLAVIX) 75 MG tablet, Take 1 tablet (75 mg total) by mouth daily., Disp: 90 tablet, Rfl: 3 .  dapagliflozin propanediol (FARXIGA) 10 MG TABS tablet, Take 1 tablet (10 mg total) by mouth daily., Disp: 90 tablet, Rfl: 3 .  fluticasone (FLONASE) 50 MCG/ACT nasal spray, Place 1 spray into both  nostrils daily as needed for allergies or rhinitis., Disp: 16 g, Rfl: 0 .  glipiZIDE (GLUCOTROL) 10 MG tablet, TAKE 1 TABLET BY MOUTH TWICE DAILY BEFORE A MEAL, Disp: 180 tablet, Rfl: 3 .  hydrochlorothiazide (HYDRODIURIL) 25 MG tablet, Take 1 tablet (25 mg total) by mouth daily., Disp: 90 tablet, Rfl: 3 .  insulin detemir (LEVEMIR) 100 UNIT/ML injection, Inject 0.3 mLs (30 Units total) into the skin at bedtime., Disp: 10 mL, Rfl: 6 .  Insulin Syringe-Needle U-100 (INSULIN SYRINGE .5CC/30GX1/2") 30G X 1/2" 0.5 ML MISC, Use as directed to inject insulin., Disp: 100 each, Rfl: 6 .  loratadine (CLARITIN) 10 MG tablet, Take 1 tablet (10 mg total) by mouth daily., Disp: 30 tablet, Rfl: 0 .  losartan (COZAAR) 100 MG tablet, Take 1 tablet (100 mg total) by mouth daily., Disp: 90 tablet, Rfl: 3 .  metFORMIN (GLUCOPHAGE) 1000 MG tablet, Take 1 tablet (1,000 mg total) by mouth 2 (two) times daily., Disp: 180 tablet, Rfl: 3 .  pneumococcal 23 valent vaccine (PNEUMOVAX 23) 25 MCG/0.5ML injection, To be administered by the pharmacist, Disp: 0.5 mL, Rfl: 0  Social History   Tobacco Use  Smoking Status Never Smoker  Smokeless Tobacco Never Used    Allergies  Allergen Reactions  . Trazodone And Nefazodone     Makes her  feel jittery   Objective:  There were no vitals filed for this visit. There is no height or weight on file to calculate BMI. Constitutional Well developed. Well nourished.  Vascular Dorsalis pedis pulses palpable bilaterally. Posterior tibial pulses palpable bilaterally. Capillary refill normal to all digits.  No cyanosis or clubbing noted. Pedal hair growth normal.  Neurologic Normal speech. Oriented to person, place, and time. Epicritic sensation to light touch grossly present bilaterally.  Dermatologic Nails well groomed and normal in appearance. No open wounds. No skin lesions.  Orthopedic:  Pain on palpation along the course of the posterior tibial tendon primarily at the  insertion of the posterior tibial tendon.  Pain with resisted dorsiflexion inversion of the foot.  No pain with dorsiflexion eversion of the foot.   Radiographs: 3 views of skeletally mature adult right foot: Mild navicular tuberosity noted however.  Given that these were ankle x-rays not appreciated enough.  There is mild decreasing calcaneal clinician angle increase in talar declination angle anterior break in the cyma line findings consistent with pes planovalgus Assessment:   1. Posterior tibial tendinitis, right    Plan:  Patient was evaluated and treated and all questions answered.  Right posterior tibial tendinitis -I explained the patient the etiology of tendinitis of bursa but options were extensively discussed.  I believe patient will benefit from cam boot immobilization given the amount of pain that she is having.  If there is any improvement we will discuss transition into a brace and regular shoes.  I will also discussed with her the importance of shoe gear modification as well. -Cam boot was dispensed  No follow-ups on file.

## 2020-06-15 ENCOUNTER — Encounter: Payer: Self-pay | Admitting: Internal Medicine

## 2020-06-18 MED FILL — $LEVEMIR 100U/ML VIAL: 100 | 30 days supply | Qty: 10 | Fill #1

## 2020-07-02 ENCOUNTER — Encounter: Payer: Self-pay | Admitting: Podiatry

## 2020-07-02 ENCOUNTER — Ambulatory Visit (INDEPENDENT_AMBULATORY_CARE_PROVIDER_SITE_OTHER): Payer: Self-pay | Admitting: Podiatry

## 2020-07-02 ENCOUNTER — Other Ambulatory Visit: Payer: Self-pay

## 2020-07-02 DIAGNOSIS — M76821 Posterior tibial tendinitis, right leg: Secondary | ICD-10-CM

## 2020-07-02 NOTE — Progress Notes (Signed)
Subjective:  Patient ID: Breanna Obrien, female    DOB: 1956/03/03,  MRN: [id number]  Chief Complaint  Patient presents with  . Foot Pain    Pt stated that her right foot is doing a lot better than it was when she first came in she stated that she still has a little pain but it  is not everyday    65 y.o. female presents with the above complaint.  Patient presents with a follow-up of right posterior tibial tendinitis.  She states she is doing a lot better.  She does not have any pain.  She has been ambulating with a cam boot.  She denies any other acute complaints.   Review of Systems: Negative except as noted in the HPI. Denies N/V/F/Ch.  Past Medical History:  Diagnosis Date  . Diabetes mellitus without complication ([place])   . Hypertension     Current Outpatient Medications:  .  albuterol (VENTOLIN HFA) 108 (90 Base) MCG/ACT inhaler, Inhale 2 puffs into the lungs every 6 (six) hours as needed for wheezing or shortness of breath., Disp: 6.7 g, Rfl: 0 .  amLODipine (NORVASC) 10 MG tablet, Take 1 tablet (10 mg total) by mouth daily., Disp: 90 tablet, Rfl: 3 .  atenolol (TENORMIN) 100 MG tablet, Take 1 tablet (100 mg total) by mouth every evening., Disp: 90 tablet, Rfl: 3 .  atorvastatin (LIPITOR) 40 MG tablet, Take 1 tablet (40 mg total) by mouth daily at 6 PM., Disp: 90 tablet, Rfl: 3 .  calcium-vitamin D (OSCAL WITH D) 500-200 MG-UNIT tablet, Take 1 tablet by mouth., Disp: , Rfl:  .  clopidogrel (PLAVIX) 75 MG tablet, Take 1 tablet (75 mg total) by mouth daily., Disp: 90 tablet, Rfl: 3 .  dapagliflozin propanediol (FARXIGA) 10 MG TABS tablet, Take 1 tablet (10 mg total) by mouth daily., Disp: 90 tablet, Rfl: 3 .  fluticasone (FLONASE) 50 MCG/ACT nasal spray, Place 1 spray into both nostrils daily as needed for allergies or rhinitis., Disp: 16 g, Rfl: 0 .  glipiZIDE (GLUCOTROL) 10 MG tablet, TAKE 1 TABLET BY MOUTH TWICE DAILY BEFORE A MEAL, Disp: 180 tablet, Rfl: 3 .   hydrochlorothiazide (HYDRODIURIL) 25 MG tablet, Take 1 tablet (25 mg total) by mouth daily., Disp: 90 tablet, Rfl: 3 .  insulin detemir (LEVEMIR) 100 UNIT/ML injection, Inject 0.3 mLs (30 Units total) into the skin at bedtime., Disp: 10 mL, Rfl: 6 .  Insulin Syringe-Needle U-100 (INSULIN SYRINGE .5CC/30GX1/2") 30G X 1/2" 0.5 ML MISC, Use as directed to inject insulin., Disp: 100 each, Rfl: 6 .  loratadine (CLARITIN) 10 MG tablet, Take 1 tablet (10 mg total) by mouth daily., Disp: 30 tablet, Rfl: 0 .  losartan (COZAAR) 100 MG tablet, Take 1 tablet (100 mg total) by mouth daily., Disp: 90 tablet, Rfl: 3 .  metFORMIN (GLUCOPHAGE) 1000 MG tablet, Take 1 tablet (1,000 mg total) by mouth 2 (two) times daily., Disp: 180 tablet, Rfl: 3 .  pneumococcal 23 valent vaccine (PNEUMOVAX 23) 25 MCG/0.5ML injection, To be administered by the pharmacist, Disp: 0.5 mL, Rfl: 0  Social History   Tobacco Use  Smoking Status Never Smoker  Smokeless Tobacco Never Used    Allergies  Allergen Reactions  . Trazodone And Nefazodone     Makes her feel jittery   Objective:  There were no vitals filed for this visit. There is no height or weight on file to calculate BMI. Constitutional Well developed. Well nourished.  Vascular Dorsalis pedis pulses palpable bilaterally. Posterior  tibial pulses palpable bilaterally. Capillary refill normal to all digits.  No cyanosis or clubbing noted. Pedal hair growth normal.  Neurologic Normal speech. Oriented to person, place, and time. Epicritic sensation to light touch grossly present bilaterally.  Dermatologic Nails well groomed and normal in appearance. No open wounds. No skin lesions.  Orthopedic:  No pain on palpation along the course of the posterior tibial tendon primarily at the insertion of the posterior tibial tendon.  No pain with resisted dorsiflexion inversion of the foot.  No pain with dorsiflexion eversion of the foot.   Radiographs: 3 views of skeletally  mature adult right foot: Mild navicular tuberosity noted however.  Given that these were ankle x-rays not appreciated enough.  There is mild decreasing calcaneal clinician angle increase in talar declination angle anterior break in the cyma line findings consistent with pes planovalgus Assessment:   1. Posterior tibial tendinitis, right    Plan:  Patient was evaluated and treated and all questions answered.  Right posterior tibial tendinitis -Clinically her pain has considerably improved.  Given that patient's pain has drastically improved greater than 90% at this time I discussed with her the importance on transitioning out of the boot into good comfortable sneakers.  I discussed shoe gear modification with obtain new balance sneakers.  Patient states understanding and will do so.  If any foot and ankle issues arise in the future I have asked her to come back and see me.  She states understanding.  No follow-ups on file.

## 2020-07-21 ENCOUNTER — Other Ambulatory Visit: Payer: Self-pay | Admitting: Internal Medicine

## 2020-07-21 DIAGNOSIS — I10 Essential (primary) hypertension: Secondary | ICD-10-CM

## 2020-07-21 DIAGNOSIS — E1169 Type 2 diabetes mellitus with other specified complication: Secondary | ICD-10-CM

## 2020-07-21 DIAGNOSIS — E785 Hyperlipidemia, unspecified: Secondary | ICD-10-CM

## 2020-07-21 DIAGNOSIS — I2510 Atherosclerotic heart disease of native coronary artery without angina pectoris: Secondary | ICD-10-CM

## 2020-07-21 MED FILL — HYDROCHLOROTHIAZIDE 25 MG T: 25 | 30 days supply | Qty: 30 | Fill #0

## 2020-07-21 MED FILL — ?CLOPIDOGREL 75MG TABL: 75 | 30 days supply | Qty: 30 | Fill #0

## 2020-07-21 MED FILL — ?ATORVASTATIN 40MG TABLET: 40 | 30 days supply | Qty: 30 | Fill #0

## 2020-07-21 MED FILL — ATENOLOL 100 MG TABLET: 100 | 90 days supply | Qty: 90 | Fill #2

## 2020-07-21 MED FILL — metFORMIN HCL 1000 MG TABS: 1000 | 90 days supply | Qty: 180 | Fill #0

## 2020-07-21 MED FILL — AMLODIPINE BESYLATE 10 MG T: 10 | 90 days supply | Qty: 90 | Fill #0

## 2020-07-21 MED FILL — glipiZIDE 10 MG TABS: 10 | 90 days supply | Qty: 180 | Fill #2

## 2020-07-21 MED FILL — $LEVEMIR 100U/ML VIAL: 100 | 38 days supply | Qty: 10 | Fill #4

## 2020-07-21 MED FILL — LOSARTAN POTASSIUM 100 MG T: 100 | 90 days supply | Qty: 90 | Fill #2

## 2020-07-26 ENCOUNTER — Ambulatory Visit: Payer: Self-pay | Attending: Internal Medicine

## 2020-07-26 ENCOUNTER — Other Ambulatory Visit: Payer: Self-pay

## 2020-08-19 MED FILL — ?CLOPIDOGREL 75MG TABL: 75 | 30 days supply | Qty: 30 | Fill #1

## 2020-08-19 MED FILL — $LEVEMIR 100U/ML VIAL: 100 | 30 days supply | Qty: 10 | Fill #2

## 2020-08-19 MED FILL — ?ATORVASTATIN 40MG TABLET: 40 | 30 days supply | Qty: 30 | Fill #1

## 2020-08-19 MED FILL — HYDROCHLOROTHIAZIDE 25 MG T: 25 | 30 days supply | Qty: 30 | Fill #1

## 2020-08-23 ENCOUNTER — Ambulatory Visit: Payer: Self-pay | Attending: Internal Medicine

## 2020-08-23 ENCOUNTER — Other Ambulatory Visit: Payer: Self-pay

## 2020-08-30 ENCOUNTER — Ambulatory Visit: Payer: Medicaid Other

## 2020-08-30 ENCOUNTER — Ambulatory Visit: Payer: Self-pay | Attending: Internal Medicine

## 2020-08-30 ENCOUNTER — Other Ambulatory Visit: Payer: Self-pay

## 2020-09-20 ENCOUNTER — Ambulatory Visit: Payer: Self-pay | Attending: Internal Medicine

## 2020-09-20 ENCOUNTER — Other Ambulatory Visit: Payer: Self-pay

## 2020-09-20 ENCOUNTER — Ambulatory Visit: Payer: No Typology Code available for payment source | Admitting: Internal Medicine

## 2020-09-20 DIAGNOSIS — Z794 Long term (current) use of insulin: Secondary | ICD-10-CM

## 2020-09-21 ENCOUNTER — Encounter: Payer: Self-pay | Admitting: Internal Medicine

## 2020-09-21 LAB — COMPREHENSIVE METABOLIC PANEL
ALT: 58 IU/L — ABNORMAL HIGH (ref 0–32)
AST: 30 IU/L (ref 0–40)
Albumin/Globulin Ratio: 1.5 (ref 1.2–2.2)
Albumin: 4.5 g/dL (ref 3.8–4.8)
Alkaline Phosphatase: 95 IU/L (ref 44–121)
BUN/Creatinine Ratio: 21 (ref 12–28)
BUN: 24 mg/dL (ref 8–27)
Bilirubin Total: 0.3 mg/dL (ref 0.0–1.2)
CO2: 23 mmol/L (ref 20–29)
Calcium: 10.2 mg/dL (ref 8.7–10.3)
Chloride: 100 mmol/L (ref 96–106)
Creatinine, Ser: 1.17 mg/dL — ABNORMAL HIGH (ref 0.57–1.00)
Globulin, Total: 3.1 g/dL (ref 1.5–4.5)
Glucose: 153 mg/dL — ABNORMAL HIGH (ref 65–99)
Potassium: 4.8 mmol/L (ref 3.5–5.2)
Sodium: 141 mmol/L (ref 134–144)
Total Protein: 7.6 g/dL (ref 6.0–8.5)
eGFR: 52 mL/min/{1.73_m2} — ABNORMAL LOW (ref >59)

## 2020-09-21 LAB — CBC
Hematocrit: 38 % (ref 34.0–46.6)
Hemoglobin: 12.4 g/dL (ref 11.1–15.9)
MCH: 29.9 pg (ref 26.6–33.0)
MCHC: 32.6 g/dL (ref 31.5–35.7)
MCV: 92 fL (ref 79–97)
Platelets: 301 10*3/uL (ref 150–450)
RBC: 4.15 x10E6/uL (ref 3.77–5.28)
RDW: 13.3 % (ref 11.7–15.4)
WBC: 8 10*3/uL (ref 3.4–10.8)

## 2020-09-21 LAB — LIPID PANEL
Chol/HDL Ratio: 4.1 ratio (ref 0.0–4.4)
Cholesterol, Total: 163 mg/dL (ref 100–199)
HDL: 40 mg/dL (ref >39)
LDL Chol Calc (NIH): 93 mg/dL (ref 0–99)
Triglycerides: 172 mg/dL — ABNORMAL HIGH (ref 0–149)
VLDL Cholesterol Cal: 30 mg/dL (ref 5–40)

## 2020-09-21 LAB — MICROALBUMIN / CREATININE URINE RATIO
Creatinine, Urine: 63.3 mg/dL
Microalb/Creat Ratio: 17 mg/g creat (ref 0–29)
Microalbumin, Urine: 10.6 ug/mL

## 2020-09-21 LAB — HEMOGLOBIN A1C
Est. average glucose Bld gHb Est-mCnc: 163 mg/dL
Hgb A1c MFr Bld: 7.3 % — ABNORMAL HIGH (ref 4.8–5.6)

## 2020-09-22 ENCOUNTER — Encounter: Payer: Self-pay | Admitting: Internal Medicine

## 2020-09-22 ENCOUNTER — Other Ambulatory Visit: Payer: Self-pay | Admitting: Internal Medicine

## 2020-09-22 DIAGNOSIS — R195 Other fecal abnormalities: Secondary | ICD-10-CM

## 2020-09-22 LAB — FECAL OCCULT BLOOD, IMMUNOCHEMICAL: Fecal Occult Bld: POSITIVE — AB

## 2020-09-24 ENCOUNTER — Other Ambulatory Visit: Payer: Self-pay

## 2020-09-24 MED FILL — Clopidogrel Bisulfate Tab 75 MG (Base Equiv): ORAL | 30 days supply | Qty: 30 | Fill #0 | Status: AC

## 2020-09-24 MED FILL — Hydrochlorothiazide Tab 25 MG: ORAL | 30 days supply | Qty: 30 | Fill #0 | Status: AC

## 2020-09-27 ENCOUNTER — Other Ambulatory Visit: Payer: Self-pay

## 2020-09-28 ENCOUNTER — Ambulatory Visit: Payer: Self-pay | Attending: Internal Medicine | Admitting: Internal Medicine

## 2020-09-28 ENCOUNTER — Encounter: Payer: Self-pay | Admitting: Internal Medicine

## 2020-09-28 ENCOUNTER — Other Ambulatory Visit: Payer: Self-pay

## 2020-09-28 ENCOUNTER — Encounter: Payer: Self-pay | Admitting: Gastroenterology

## 2020-09-28 VITALS — BP 114/76 | HR 78

## 2020-09-28 DIAGNOSIS — Z1231 Encounter for screening mammogram for malignant neoplasm of breast: Secondary | ICD-10-CM

## 2020-09-28 DIAGNOSIS — E1159 Type 2 diabetes mellitus with other circulatory complications: Secondary | ICD-10-CM

## 2020-09-28 DIAGNOSIS — I152 Hypertension secondary to endocrine disorders: Secondary | ICD-10-CM

## 2020-09-28 DIAGNOSIS — I2510 Atherosclerotic heart disease of native coronary artery without angina pectoris: Secondary | ICD-10-CM

## 2020-09-28 DIAGNOSIS — E785 Hyperlipidemia, unspecified: Secondary | ICD-10-CM

## 2020-09-28 DIAGNOSIS — Z794 Long term (current) use of insulin: Secondary | ICD-10-CM

## 2020-09-28 DIAGNOSIS — E1169 Type 2 diabetes mellitus with other specified complication: Secondary | ICD-10-CM

## 2020-09-28 DIAGNOSIS — R195 Other fecal abnormalities: Secondary | ICD-10-CM

## 2020-09-28 MED ORDER — GLIPIZIDE 10 MG PO TABS
ORAL_TABLET | Freq: Two times a day (BID) | ORAL | 3 refills | Status: DC
  Filled 2020-09-28 – 2020-10-18 (×2): qty 60, 30d supply, fill #0
  Filled 2020-11-17: qty 180, 90d supply, fill #1
  Filled 2021-01-31: qty 180, 90d supply, fill #2
  Filled 2021-05-06: qty 60, 30d supply, fill #3
  Filled 2021-06-13: qty 60, 30d supply, fill #4
  Filled 2021-06-13: qty 180, 90d supply, fill #0
  Filled 2021-09-16: qty 180, 90d supply, fill #1
  Filled 2021-09-17: qty 60, 30d supply, fill #1

## 2020-09-28 MED ORDER — LOSARTAN POTASSIUM 100 MG PO TABS
ORAL_TABLET | Freq: Every day | ORAL | 3 refills | Status: DC
  Filled 2020-09-28 – 2020-10-18 (×2): qty 30, 30d supply, fill #0
  Filled 2020-11-17: qty 90, 90d supply, fill #1
  Filled 2021-01-31: qty 90, 90d supply, fill #2
  Filled 2021-06-13: qty 90, 90d supply, fill #3
  Filled 2021-06-13: qty 90, 90d supply, fill #0

## 2020-09-28 MED ORDER — DAPAGLIFLOZIN PROPANEDIOL 10 MG PO TABS
10.0000 mg | ORAL_TABLET | Freq: Every day | ORAL | 3 refills | Status: DC
  Filled 2020-09-28: qty 30, 30d supply, fill #0

## 2020-09-28 MED ORDER — AMLODIPINE BESYLATE 10 MG PO TABS
ORAL_TABLET | Freq: Every day | ORAL | 3 refills | Status: DC
  Filled 2020-09-28 – 2020-10-18 (×2): qty 30, 30d supply, fill #0
  Filled 2020-11-17: qty 90, 90d supply, fill #1
  Filled 2021-01-31: qty 90, 90d supply, fill #2
  Filled 2021-05-06: qty 30, 30d supply, fill #3
  Filled 2021-06-13: qty 90, 90d supply, fill #0
  Filled 2021-06-13: qty 30, 30d supply, fill #4

## 2020-09-28 MED ORDER — METFORMIN HCL 1000 MG PO TABS
ORAL_TABLET | Freq: Two times a day (BID) | ORAL | 3 refills | Status: DC
  Filled 2020-09-28 – 2020-10-18 (×2): qty 60, 30d supply, fill #0
  Filled 2020-11-17: qty 180, 90d supply, fill #1
  Filled 2021-01-31: qty 180, 90d supply, fill #2
  Filled 2021-05-06: qty 60, 30d supply, fill #3
  Filled 2021-06-13: qty 60, 30d supply, fill #4
  Filled 2021-06-13: qty 180, 90d supply, fill #0

## 2020-09-28 MED ORDER — HYDROCHLOROTHIAZIDE 25 MG PO TABS
ORAL_TABLET | Freq: Every day | ORAL | 2 refills | Status: DC
  Filled 2020-09-28: qty 30, fill #0
  Filled 2020-10-18: qty 30, 30d supply, fill #0
  Filled 2020-11-17: qty 90, 90d supply, fill #1

## 2020-09-28 NOTE — Progress Notes (Signed)
Virtual Visit via Telephone Note  I connected with Breanna Obrien on 09/28/2020 at 1:34 p.m by telephone and verified that I am speaking with the correct person using two identifiers  Location: Patient: home Provider: office  Participants: Myself Patient   I discussed the limitations, risks, security and privacy concerns of performing an evaluation and management service by telephone and the availability of in person appointments. I also discussed with the patient that there may be a patient responsible charge related to this service. The patient expressed understanding and agreed to proceed.   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  DM Lab Results  Component Value Date   HGBA1C 7.3 (H) 09/20/2020  A1C has improved 7.8.  Reports BS better Checking 3-5x/day.  Range 111-185 Compliant with Glucotrol, Metformin, Farxiga, Levemir 30 units Doing better with eating habits  Walking 1/2-1 mile 4 days a wk  HTN/CAD: 114/76, P78 today.  Reports checking regularly and #s have been good Limits salt in foods Compliant with meds including Lipitor and ASA No CP/SOB/LE edema Recent LDL was 93.  Last yr it was 26.  Has mild elev of ALT.    HM: due for MMG.  FIT test was positive. Outpatient Encounter Medications as of 09/28/2020  Medication Sig  . albuterol (VENTOLIN HFA) 108 (90 Base) MCG/ACT inhaler Inhale 2 puffs into the lungs every 6 (six) hours as needed for wheezing or shortness of breath.  Marland Kitchen amLODipine (NORVASC) 10 MG tablet Take 1 tablet (10 mg total) by mouth daily.  Marland Kitchen amLODipine (NORVASC) 10 MG tablet TAKE 1 TABLET (10 MG TOTAL) BY MOUTH DAILY.  Marland Kitchen atenolol (TENORMIN) 100 MG tablet Take 1 tablet (100 mg total) by mouth every evening.  Marland Kitchen atenolol (TENORMIN) 100 MG tablet TAKE 1 TABLET (100 MG TOTAL) BY MOUTH EVERY EVENING.  Marland Kitchen atorvastatin (LIPITOR) 40 MG  tablet .AKE 1 TABLET (40 MG TOTAL) BY MOUTH DAILY AT 6 PM.  . atorvastatin (LIPITOR) 40 MG tablet TAKE 1 TABLET (40 MG TOTAL) BY MOUTH DAILY AT 6 PM.  . calcium-vitamin D (OSCAL WITH D) 500-200 MG-UNIT tablet Take 1 tablet by mouth.  . clopidogrel (PLAVIX) 75 MG tablet TAKE 1 TABLET (75 MG TOTAL) BY MOUTH DAILY.  . dapagliflozin propanediol (FARXIGA) 10 MG TABS tablet Take 1 tablet (10 mg total) by mouth daily.  . fluticasone (FLONASE) 50 MCG/ACT nasal spray Place 1 spray into both nostrils daily as needed for allergies or rhinitis.  Marland Kitchen glipiZIDE (GLUCOTROL) 10 MG tablet TAKE 1 TABLET BY MOUTH TWICE DAILY BEFORE A MEAL  . glipiZIDE (GLUCOTROL) 10 MG tablet TAKE 1 TABLET BY MOUTH TWICE DAILY BEFORE A MEAL  . hydrochlorothiazide (HYDRODIURIL) 25 MG tablet TAKE 1 TABLET (25 MG TOTAL) BY MOUTH DAILY.  . hydrochlorothiazide (HYDRODIURIL) 25 MG tablet TAKE 1 TABLET (25 MG TOTAL) BY MOUTH DAILY.  Marland Kitchen insulin detemir (LEVEMIR) 100 UNIT/ML injection Inject 0.3 mLs (30 Units total) into the skin at bedtime.  . Insulin Syringe-Needle U-100 (INSULIN SYRINGE .5CC/30GX1/2") 30G X 1/2" 0.5 ML MISC Use as directed to inject insulin.  . Insulin Syringe-Needle U-100 30G X 5/16" 0.5 ML MISC USE AS DIRECTED TO INJECT INSULIN.  . loratadine (CLARITIN) 10 MG tablet Take 1 tablet (10 mg total) by mouth daily.  Marland Kitchen losartan (COZAAR) 100 MG tablet Take 1 tablet (100 mg total) by mouth daily.  Marland Kitchen losartan (COZAAR) 100  MG tablet TAKE 1 TABLET (100 MG TOTAL) BY MOUTH DAILY.  . metFORMIN (GLUCOPHAGE) 1000 MG tablet Take 1 tablet (1,000 mg total) by mouth 2 (two) times daily.  . metFORMIN (GLUCOPHAGE) 1000 MG tablet TAKE 1 TABLET (1,000 MG TOTAL) BY MOUTH 2 (TWO) TIMES DAILY.  Marland Kitchen pneumococcal 23 valent vaccine (PNEUMOVAX 23) 25 MCG/0.5ML injection To be administered by the pharmacist   No facility-administered encounter medications on file as of 09/28/2020.      Observations/Objective: Results for orders placed or performed in  visit on 09/20/20  Hemoglobin A1c  Result Value Ref Range   Hgb A1c MFr Bld 7.3 (H) 4.8 - 5.6 %   Est. average glucose Bld gHb Est-mCnc 163 mg/dL  Microalbumin / creatinine urine ratio  Result Value Ref Range   Creatinine, Urine 63.3 Not Estab. mg/dL   Microalbumin, Urine 10.6 Not Estab. ug/mL   Microalb/Creat Ratio 17 0 - 29 mg/g creat  Lipid panel  Result Value Ref Range   Cholesterol, Total 163 100 - 199 mg/dL   Triglycerides 172 (H) 0 - 149 mg/dL   HDL 40 >39 mg/dL   VLDL Cholesterol Cal 30 5 - 40 mg/dL   LDL Chol Calc (NIH) 93 0 - 99 mg/dL   Chol/HDL Ratio 4.1 0.0 - 4.4 ratio  Comprehensive metabolic panel  Result Value Ref Range   Glucose 153 (H) 65 - 99 mg/dL   BUN 24 8 - 27 mg/dL   Creatinine, Ser 1.17 (H) 0.57 - 1.00 mg/dL   eGFR 52 (L) >59 mL/min/1.73   BUN/Creatinine Ratio 21 12 - 28   Sodium 141 134 - 144 mmol/L   Potassium 4.8 3.5 - 5.2 mmol/L   Chloride 100 96 - 106 mmol/L   CO2 23 20 - 29 mmol/L   Calcium 10.2 8.7 - 10.3 mg/dL   Total Protein 7.6 6.0 - 8.5 g/dL   Albumin 4.5 3.8 - 4.8 g/dL   Globulin, Total 3.1 1.5 - 4.5 g/dL   Albumin/Globulin Ratio 1.5 1.2 - 2.2   Bilirubin Total 0.3 0.0 - 1.2 mg/dL   Alkaline Phosphatase 95 44 - 121 IU/L   AST 30 0 - 40 IU/L   ALT 58 (H) 0 - 32 IU/L  CBC  Result Value Ref Range   WBC 8.0 3.4 - 10.8 x10E3/uL   RBC 4.15 3.77 - 5.28 x10E6/uL   Hemoglobin 12.4 11.1 - 15.9 g/dL   Hematocrit 38.0 34.0 - 46.6 %   MCV 92 79 - 97 fL   MCH 29.9 26.6 - 33.0 pg   MCHC 32.6 31.5 - 35.7 g/dL   RDW 13.3 11.7 - 15.4 %   Platelets 301 150 - 450 x10E3/uL     Assessment and Plan: 1. Type 2 diabetes mellitus with other circulatory complication, with long-term current use of insulin (HCC) A1c has improved.  Encouraged her to continue regular exercise and healthy eating habits.  Continue current all medications and Levemir.  2. Hypertension associated with diabetes ([place]) Reported blood pressure reading is at goal.  Continue  atenolol, amlodipine, Cozaar, hydrochlorothiazide and low-salt diet  3. Hyperlipidemia associated with type 2 diabetes mellitus (HCC) LDL not at goal.  However I do not want to increase the atorvastatin as she already has a mild increase in one of her LFTs.  Encouraged her to continue atorvastatin and healthy eating habits.    4. Coronary artery disease involving native coronary artery of native heart without angina pectoris Stable on Plavix, atenolol and statin therapy  5. Encounter for screening mammogram for malignant neoplasm of breast - MM Digital Screening; Future  6. Positive FIT (fecal immunochemical test) Discussed positive results with her and recommendation for referral to gastroenterology for colonoscopy.  Referral already submitted.   Follow Up Instructions: 4 mths   I discussed the assessment and treatment plan with the patient. The patient was provided an opportunity to ask questions and all were answered. The patient agreed with the plan and demonstrated an understanding of the instructions.   The patient was advised to call back or seek an in-person evaluation if the symptoms worsen or if the condition fails to improve as anticipated.  I  Spent 17 minutes on this telephone encounter  Karle Plumber, MD

## 2020-09-29 ENCOUNTER — Other Ambulatory Visit: Payer: Self-pay

## 2020-10-01 ENCOUNTER — Encounter: Payer: Self-pay | Admitting: Internal Medicine

## 2020-10-04 NOTE — Telephone Encounter (Signed)
Breanna Obrien with [employer] Called saying thy would like the form faxed back to them because they can get it for her for free.  FX#  [phone] Or call  [phone]

## 2020-10-05 ENCOUNTER — Encounter: Payer: Self-pay | Admitting: Internal Medicine

## 2020-10-05 ENCOUNTER — Other Ambulatory Visit: Payer: Self-pay

## 2020-10-05 DIAGNOSIS — Z794 Long term (current) use of insulin: Secondary | ICD-10-CM

## 2020-10-05 MED ORDER — DAPAGLIFLOZIN PROPANEDIOL 10 MG PO TABS: 10.0000 mg | ORAL_TABLET | Freq: Every day | ORAL | 3 refills | Status: DC

## 2020-10-06 ENCOUNTER — Other Ambulatory Visit: Payer: Self-pay

## 2020-10-08 ENCOUNTER — Other Ambulatory Visit: Payer: Self-pay

## 2020-10-15 ENCOUNTER — Other Ambulatory Visit: Payer: Self-pay | Admitting: Obstetrics and Gynecology

## 2020-10-15 DIAGNOSIS — Z1231 Encounter for screening mammogram for malignant neoplasm of breast: Secondary | ICD-10-CM

## 2020-10-18 ENCOUNTER — Other Ambulatory Visit: Payer: Self-pay

## 2020-10-18 ENCOUNTER — Other Ambulatory Visit: Payer: Self-pay | Admitting: Internal Medicine

## 2020-10-18 DIAGNOSIS — I2510 Atherosclerotic heart disease of native coronary artery without angina pectoris: Secondary | ICD-10-CM

## 2020-10-18 MED ORDER — CLOPIDOGREL BISULFATE 75 MG PO TABS
75.0000 mg | ORAL_TABLET | Freq: Every day | ORAL | 0 refills | Status: DC
  Filled 2020-10-18: qty 90, 90d supply, fill #0

## 2020-10-18 MED FILL — Atorvastatin Calcium Tab 40 MG (Base Equivalent): ORAL | 30 days supply | Qty: 30 | Fill #0 | Status: AC

## 2020-10-18 MED FILL — Atenolol Tab 100 MG: ORAL | 30 days supply | Qty: 30 | Fill #0 | Status: AC

## 2020-10-18 NOTE — Telephone Encounter (Signed)
Dr. Wynetta Emery addressed labs of 09/20/20

## 2020-10-19 ENCOUNTER — Ambulatory Visit: Payer: Medicaid Other | Admitting: Gastroenterology

## 2020-10-19 ENCOUNTER — Other Ambulatory Visit: Payer: Self-pay

## 2020-11-04 ENCOUNTER — Other Ambulatory Visit: Payer: Self-pay

## 2020-11-12 ENCOUNTER — Other Ambulatory Visit: Payer: Self-pay

## 2020-11-12 ENCOUNTER — Telehealth: Payer: Self-pay | Admitting: *Deleted

## 2020-11-12 ENCOUNTER — Encounter: Payer: Self-pay | Admitting: Gastroenterology

## 2020-11-12 ENCOUNTER — Ambulatory Visit (INDEPENDENT_AMBULATORY_CARE_PROVIDER_SITE_OTHER): Payer: Self-pay | Admitting: Gastroenterology

## 2020-11-12 VITALS — BP 138/62 | HR 72 | Ht 63.0 in | Wt 183.1 lb

## 2020-11-12 DIAGNOSIS — Z7902 Long term (current) use of antithrombotics/antiplatelets: Secondary | ICD-10-CM

## 2020-11-12 DIAGNOSIS — R195 Other fecal abnormalities: Secondary | ICD-10-CM | POA: Insufficient documentation

## 2020-11-12 DIAGNOSIS — E118 Type 2 diabetes mellitus with unspecified complications: Secondary | ICD-10-CM

## 2020-11-12 NOTE — Patient Instructions (Addendum)
You will be contacted by our office prior to your procedure for directions on holding your Plavix.  If you do not hear from our office 1 week prior to your scheduled procedure, please call [phone] to discuss.   You have been scheduled for a colonoscopy. Please follow written instructions given to you at your visit today.  Please pick up your prep supplies at the pharmacy within the next 1-3 days. If you use inhalers (even only as needed), please bring them with you on the day of your procedure.  If you are age 45 or older, your body mass index should be between 23-30. Your Body mass index is 32.44 kg/m. If this is out of the aforementioned range listed, please consider follow up with your Primary Care Provider.  If you are age 3 or younger, your body mass index should be between 19-25. Your Body mass index is 32.44 kg/m. If this is out of the aformentioned range listed, please consider follow up with your Primary Care Provider.   __________________________________________________________  The [facility] providers would like to encourage you to use [facility] to communicate with providers for non-urgent requests or questions.  Due to long hold times on the telephone, sending your provider a message by [facility] may be a faster and more efficient way to get a response.  Please allow 48 business hours for a response.  Please remember that this is for non-urgent requests.

## 2020-11-12 NOTE — Progress Notes (Signed)
11/12/2020 Breanna Obrien [id number] 06-01-1955   HISTORY OF PRESENT ILLNESS: This is a 64 year old female who is new to our office.  She has been referred here by her PCP, Dr. Wynetta Emery, for evaluation regarding a positive fecal immunochemistry testing.  She never had a colonoscopy in the past.  She denies any rectal bleeding.  She says that she has some constipation, but it has been like that forever.  She has a bowel movement at least every other day.  Her hemoglobin on recent labs is normal.  She is on Plavix for remote history of coronary artery stent placement and history of TIA.  Plavix as prescribed by her PCP, Dr. Wynetta Emery.  She is also diabetic on insulin.  No GI complaints.   Past Medical History:  Diagnosis Date   Bell's palsy    CAD (coronary artery disease)    Diabetes mellitus without complication ([place])    Gallstones    Heart disease    HLD (hyperlipidemia)    Hypertension    Mini stroke ([place])    Past Surgical History:  Procedure Laterality Date   CHOLECYSTECTOMY     CORONARY ANGIOPLASTY WITH STENT PLACEMENT     TONSILLECTOMY      reports that she quit smoking about 26 years ago. Her smoking use included cigarettes. She has never used smokeless tobacco. She reports that she does not drink alcohol and does not use drugs. family history includes Breast cancer in her maternal aunt; Diabetes in her brother and mother; HIV/AIDS in her brother; Heart attack in her father; Hypertension in her brother and brother; Liver disease in her brother; Pancreatic cancer in her mother. Allergies  Allergen Reactions   Trazodone And Nefazodone     Makes her feel jittery      Outpatient Encounter Medications as of 11/12/2020  Medication Sig   ALPHA LIPOIC ACID PO Take 1 tablet by mouth daily. With green tea   amLODipine (NORVASC) 10 MG tablet TAKE 1 TABLET (10 MG TOTAL) BY MOUTH DAILY.   Ascorbic Acid (VITAMIN C PO) Take 1 tablet by mouth daily.   atenolol (TENORMIN) 100 MG tablet  TAKE 1 TABLET (100 MG TOTAL) BY MOUTH EVERY EVENING.   atorvastatin (LIPITOR) 40 MG tablet TAKE 1 TABLET (40 MG TOTAL) BY MOUTH DAILY AT 6 PM.   calcium-vitamin D (OSCAL WITH D) 500-200 MG-UNIT tablet Take 1 tablet by mouth.   CHROMIUM PO Take 1 tablet by mouth daily.   clopidogrel (PLAVIX) 75 MG tablet TAKE 1 TABLET (75 MG TOTAL) BY MOUTH DAILY.   dapagliflozin propanediol (FARXIGA) 10 MG TABS tablet Take 1 tablet (10 mg total) by mouth daily.   Fish Oil-Cholecalciferol (FISH OIL + D3 PO) Take 1 tablet by mouth daily.   glipiZIDE (GLUCOTROL) 10 MG tablet TAKE 1 TABLET BY MOUTH TWICE DAILY BEFORE A MEAL   hydrochlorothiazide (HYDRODIURIL) 25 MG tablet TAKE 1 TABLET (25 MG TOTAL) BY MOUTH DAILY.   insulin detemir (LEVEMIR) 100 UNIT/ML injection Inject 0.3 mLs (30 Units total) into the skin at bedtime.   Insulin Syringe-Needle U-100 (INSULIN SYRINGE .5CC/30GX1/2") 30G X 1/2" 0.5 ML MISC Use as directed to inject insulin.   losartan (COZAAR) 100 MG tablet TAKE 1 TABLET (100 MG TOTAL) BY MOUTH DAILY.   MAGNESIUM PO Take 1 tablet by mouth daily.   metFORMIN (GLUCOPHAGE) 1000 MG tablet TAKE 1 TABLET (1,000 MG TOTAL) BY MOUTH 2 (TWO) TIMES DAILY.   Multiple Vitamin (MULTIVITAMIN PO) Take 1 tablet by mouth  daily.   [DISCONTINUED] albuterol (VENTOLIN HFA) 108 (90 Base) MCG/ACT inhaler Inhale 2 puffs into the lungs every 6 (six) hours as needed for wheezing or shortness of breath.   [DISCONTINUED] fluticasone (FLONASE) 50 MCG/ACT nasal spray Place 1 spray into both nostrils daily as needed for allergies or rhinitis.   [DISCONTINUED] glipiZIDE (GLUCOTROL) 10 MG tablet TAKE 1 TABLET BY MOUTH TWICE DAILY BEFORE A MEAL   [DISCONTINUED] loratadine (CLARITIN) 10 MG tablet Take 1 tablet (10 mg total) by mouth daily.   [DISCONTINUED] pneumococcal 23 valent vaccine (PNEUMOVAX 23) 25 MCG/0.5ML injection To be administered by the pharmacist   No facility-administered encounter medications on file as of 11/12/2020.      REVIEW OF SYSTEMS  : All other systems reviewed and negative except where noted in the History of Present Illness.   PHYSICAL EXAM: BP 138/62 (BP Location: Left Arm, Patient Position: Sitting, Cuff Size: Normal)   Pulse 72   Ht 5\' 3"  (1.6 m) Comment: height measured without shoes  Wt 183 lb 2 oz (83.1 kg)   BMI 32.44 kg/m  General: Well developed white female in no acute distress Head: Normocephalic and atraumatic Eyes:  Sclerae anicteric, conjunctiva pink. Ears: Normal auditory acuity Lungs: Clear throughout to auscultation; no W/R/R. Heart: Regular rate and rhythm; no M/R/G. Abdomen: Soft, non-distended.  BS present.  Non-tender. Rectal:  Will be done at the time of colonoscopy. Musculoskeletal: Symmetrical with no gross deformities  Skin: No lesions on visible extremities Extremities: No edema  Neurological: Alert oriented x 4, grossly non-focal Psychological:  Alert and cooperative. Normal mood and affect  ASSESSMENT AND PLAN: *Positive fecal immunochemistry testing: Never had a colonoscopy in the past.  Denies any sign of overt GI bleeding.  Hemoglobin normal. *Chronic anticoagulation with Plavix due to remote history of coronary stent and TIA:  Hold Plavix for 5 days before procedure - will instruct when and how to resume after procedure. Risks and benefits of procedure including bleeding, perforation, infection, missed lesions, medication reactions and possible hospitalization or surgery if complications occur explained. Additional rare but real risk of cardiovascular event such as heart attack or ischemia/infarct of other organs off of Plavix explained and need to seek urgent help if this occurs. Will communicate by phone or EMR with patient's prescribing provider, Dr. Wynetta Emery, to confirm that holding Plavix is reasonable in this case.   *IDDM:  Insulin will be adjusted prior to endoscopic procedure per protocol. Will resume normal dosing after procedure.    CC:   [name], MD

## 2020-11-12 NOTE — Telephone Encounter (Signed)
   Breanna Obrien 03/30/1956 [id number]  Dear Dr. Wynetta Emery:  We have scheduled the above named patient for a(n) colonoscopy procedure. Our records show that (s)he is on anticoagulation therapy.  Please advise as to whether the patient may come off their therapy of Plavix 5 days prior to their procedure which is scheduled for Wednesday 11/26/20.  Please route your response to [facility], CMA or fax response to [phone].  Sincerely,   Caryl Asp, [facility]

## 2020-11-17 ENCOUNTER — Other Ambulatory Visit: Payer: Self-pay | Admitting: Internal Medicine

## 2020-11-17 ENCOUNTER — Other Ambulatory Visit: Payer: Self-pay

## 2020-11-17 DIAGNOSIS — I2510 Atherosclerotic heart disease of native coronary artery without angina pectoris: Secondary | ICD-10-CM

## 2020-11-17 MED FILL — Atenolol Tab 100 MG: ORAL | 60 days supply | Qty: 60 | Fill #1 | Status: CN

## 2020-11-18 ENCOUNTER — Ambulatory Visit: Payer: Self-pay | Admitting: *Deleted

## 2020-11-18 ENCOUNTER — Ambulatory Visit
Admission: RE | Admit: 2020-11-18 | Discharge: 2020-11-18 | Disposition: A | Discharge status: Home / Self Care | Payer: No Typology Code available for payment source | Source: Ambulatory Visit | Attending: Obstetrics and Gynecology | Admitting: Obstetrics and Gynecology

## 2020-11-18 ENCOUNTER — Other Ambulatory Visit: Payer: Self-pay

## 2020-11-18 ENCOUNTER — Other Ambulatory Visit: Payer: Self-pay | Admitting: Internal Medicine

## 2020-11-18 VITALS — BP 136/80 | Wt 183.5 lb

## 2020-11-18 DIAGNOSIS — Z1239 Encounter for other screening for malignant neoplasm of breast: Secondary | ICD-10-CM

## 2020-11-18 DIAGNOSIS — Z1231 Encounter for screening mammogram for malignant neoplasm of breast: Secondary | ICD-10-CM

## 2020-11-18 DIAGNOSIS — I2510 Atherosclerotic heart disease of native coronary artery without angina pectoris: Secondary | ICD-10-CM

## 2020-11-18 NOTE — Patient Instructions (Signed)
Explained breast self awareness with [employer]. Patient did not need a Pap smear today due to last Pap smear and HPV typing was 01/15/2017. Let her know [facility] will cover Pap smears and HPV typing every 5 years unless has a history of abnormal Pap smears. Referred patient to the [facility] for a screening mammogram on the mobile unit. Appointment scheduled Thursday, November 18, 2020 at 1100. Patient escorted to the mobile unit following [facility] appointment for her screening mammogram. Let patient know the [facility] will follow up with her within the next couple weeks with results of her mammogram by letter or phone. Breanna Obrien verbalized understanding.  Breanna Obrien, Arvil Chaco, RN 10:22 AM

## 2020-11-18 NOTE — Telephone Encounter (Signed)
Informed patient she can hold Plavix. Patient voiced understanding.

## 2020-11-18 NOTE — Progress Notes (Signed)
[name] is a 65 y.o. female who presents to [facility] clinic today with no complaints.    Pap Smear: Pap smear not completed today. Last Pap smear was 01/15/2017 at [facility] and was normal with negative HPV. Per patient has no history of an abnormal Pap smear. Last Pap smear result is available in Epic.   Physical exam: Breasts Breasts symmetrical. No skin abnormalities left breast. Scar observed right axilla that per patient was due to a history of an abscess. No nipple retraction bilateral breasts. No nipple discharge bilateral breasts. No lymphadenopathy. No lumps palpated bilateral breasts. No complaints of pain or tenderness on exam.  MM DIAG BREAST TOMO BILATERAL  Result Date: 06/25/2018 CLINICAL DATA:  Final follow-up for probably benign calcifications in the bilateral breasts. EXAM: DIGITAL DIAGNOSTIC BILATERAL MAMMOGRAM WITH CAD AND TOMO COMPARISON:  Previous exam(s). ACR Breast Density Category b: There are scattered areas of fibroglandular density. FINDINGS: The small group of coarse heterogeneous calcifications in the lateral posterior right and upper outer posterior left breast are mammographically stable. No suspicious calcifications, masses or areas of distortion are seen in the bilateral breasts. Mammographic images were processed with CAD. IMPRESSION: 1. The bilateral calcifications have demonstrated 2 years of stability, and are therefore benign. 2.  No mammographic evidence of malignancy in the bilateral breasts. RECOMMENDATION: Screening mammogram in one year.(Code:[id number]) I have discussed the findings and recommendations with the patient. Results were also provided in writing at the conclusion of the visit. If applicable, a reminder letter will be sent to the patient regarding the next appointment. BI-RADS CATEGORY  2: Benign. Electronically Signed   By: Ammie Ferrier M.D.   On: 06/25/2018 09:12   MM DIAG BREAST TOMO  BILATERAL  Result Date: 12/21/2017 CLINICAL DATA:  Delayed followup for probably benign findings in both breasts. Previous evaluation was performed at [facility]. EXAM: DIGITAL DIAGNOSTIC BILATERAL MAMMOGRAM WITH CAD AND TOMO ULTRASOUND LEFT BREAST COMPARISON:  04/10/2016, 03/25/2016 from [facility] ACR Breast Density Category b: There are scattered areas of fibroglandular density. FINDINGS: Magnified views are performed of calcifications in the UPPER-OUTER quadrants bilaterally. In these regions there are groups of dystrophic appearing calcifications without suspicious morphology or distribution. Calcifications appear stable. No new mass or distortion identified. Chronic sebaceous cyst is identified in the RIGHT axilla. Mammographic images were processed with CAD. Targeted ultrasound is performed, showing normal appearing fibroglandular tissue in the 3 o'clock location of the LEFT breast. No persistent mass identified. IMPRESSION: 1. Stable benign-appearing bilateral breast calcifications. Continued followup is recommended to document 2 years of stability. 2. No persistent abnormality in the 3 o'clock location of the LEFT breast by ultrasound. RECOMMENDATION: Bilateral diagnostic mammogram is recommended in 6 months to complete 2 years of follow-up. I have discussed the findings and recommendations with the patient. Results were also provided in writing at the conclusion of the visit. If applicable, a reminder letter will be sent to the patient regarding the next appointment. BI-RADS CATEGORY  3: Probably benign. Electronically Signed   By: Nolon Nations M.D.   On: 12/21/2017 10:33      Pelvic/Bimanual Pap is not indicated today per BCCCP guidelines.   Smoking History: Patient has never smoked.   Patient Navigation: Patient education provided. Access to services provided for patient through [place] program.   Colorectal Cancer Screening: Per patient has never  had colonoscopy completed. Patient has a colonoscopy scheduled 11/26/2020 at [place]. No complaints today.  Breast and Cervical Cancer Risk Assessment: Patient has family history of a maternal aunt having breast cancer. Patient has no known genetic mutations or history of radiation treatment to the chest before age 73. Patient does not have history of cervical dysplasia, immunocompromised, or DES exposure in-utero.  Risk Assessment     Risk Scores       11/18/2020 11/01/2017   Last edited by: Demetrius Revel, LPN Breanna Obrien, Breanna Gold, RN   5-year risk: 3.4 % 3.1 %   Lifetime risk: 13.2 % 14.4 %            A: BCCCP exam without pap smear No complaints.  P: Referred patient to the [facility] for a screening mammogram on the mobile unit. Appointment scheduled Thursday, November 18, 2020 at 1100.  Loletta Parish, RN 11/18/2020 10:22 AM

## 2020-11-18 NOTE — Telephone Encounter (Signed)
Requested medication (s) are due for refill today:no  Requested medication (s) are on the active medication list: yes   Last refill: 10/19/2020  Future visit scheduled: yes  Notes to clinic: requesting for future refill    Requested Prescriptions  Pending Prescriptions Disp Refills   clopidogrel (PLAVIX) 75 MG tablet 90 tablet 0    Sig: TAKE 1 TABLET (75 MG TOTAL) BY MOUTH DAILY.      Hematology: Antiplatelets - clopidogrel Failed - 11/18/2020  9:30 AM      Failed - Evaluate AST, ALT within 2 months of therapy initiation.      Failed - ALT in normal range and within 360 days    ALT  Date Value Ref Range Status  09/20/2020 58 (H) 0 - 32 IU/L Final          Passed - AST in normal range and within 360 days    AST  Date Value Ref Range Status  09/20/2020 30 0 - 40 IU/L Final          Passed - HCT in normal range and within 180 days    Hematocrit  Date Value Ref Range Status  09/20/2020 38.0 34.0 - 46.6 % Final          Passed - HGB in normal range and within 180 days    Hemoglobin  Date Value Ref Range Status  09/20/2020 12.4 11.1 - 15.9 g/dL Final          Passed - PLT in normal range and within 180 days    Platelets  Date Value Ref Range Status  09/20/2020 301 150 - 450 x10E3/uL Final          Passed - Valid encounter within last 6 months    Recent Outpatient Visits           1 month ago Type 2 diabetes mellitus with other circulatory complication, with long-term current use of insulin ([place])   [place], Neoma Laming B, MD   6 months ago Type 2 diabetes mellitus with other circulatory complication, with long-term current use of insulin ([place])   [place] [name], MD   9 months ago Need for influenza vaccination   [place], Annie Main L, RPH-CPP   10 months ago Type 2 diabetes mellitus with other circulatory complication, with long-term  current use of insulin [facility])   [place], Deborah B, MD   1 year ago Controlled type 2 diabetes mellitus with other circulatory complication, with long-term current use of insulin [facility])   [place], MD       Future Appointments             In 2 months Wynetta Emery Dalbert Batman, MD [place]

## 2020-11-19 ENCOUNTER — Other Ambulatory Visit: Payer: Self-pay

## 2020-11-19 MED ORDER — CLOPIDOGREL BISULFATE 75 MG PO TABS
75.0000 mg | ORAL_TABLET | Freq: Every day | ORAL | 2 refills | Status: DC
  Filled 2020-11-19: qty 30, 30d supply, fill #0
  Filled 2020-11-24: qty 90, 90d supply, fill #0
  Filled 2020-12-01: qty 30, 30d supply, fill #0
  Filled 2021-01-31: qty 90, 90d supply, fill #0

## 2020-11-25 ENCOUNTER — Other Ambulatory Visit: Payer: Self-pay

## 2020-11-26 ENCOUNTER — Encounter: Payer: Self-pay | Admitting: Gastroenterology

## 2020-11-26 ENCOUNTER — Ambulatory Visit (AMBULATORY_SURGERY_CENTER): Payer: Self-pay | Admitting: Gastroenterology

## 2020-11-26 ENCOUNTER — Encounter: Payer: Self-pay | Admitting: *Deleted

## 2020-11-26 ENCOUNTER — Other Ambulatory Visit: Payer: Self-pay

## 2020-11-26 VITALS — BP 116/59 | HR 67 | Temp 97.3°F | Resp 20 | Ht 63.0 in | Wt 183.0 lb

## 2020-11-26 DIAGNOSIS — D128 Benign neoplasm of rectum: Secondary | ICD-10-CM

## 2020-11-26 DIAGNOSIS — D127 Benign neoplasm of rectosigmoid junction: Secondary | ICD-10-CM

## 2020-11-26 DIAGNOSIS — K635 Polyp of colon: Secondary | ICD-10-CM

## 2020-11-26 DIAGNOSIS — K621 Rectal polyp: Secondary | ICD-10-CM

## 2020-11-26 DIAGNOSIS — D125 Benign neoplasm of sigmoid colon: Secondary | ICD-10-CM

## 2020-11-26 DIAGNOSIS — R195 Other fecal abnormalities: Secondary | ICD-10-CM

## 2020-11-26 DIAGNOSIS — K5730 Diverticulosis of large intestine without perforation or abscess without bleeding: Secondary | ICD-10-CM

## 2020-11-26 DIAGNOSIS — K641 Second degree hemorrhoids: Secondary | ICD-10-CM

## 2020-11-26 MED ORDER — SODIUM CHLORIDE 0.9 % IV SOLN: 500.0000 mL | Freq: Once | INTRAVENOUS | Status: DC

## 2020-11-26 NOTE — Progress Notes (Signed)
Called to room to assist during endoscopic procedure.  Patient ID and intended procedure confirmed with present staff. Received instructions for my participation in the procedure from the performing physician.  

## 2020-11-26 NOTE — Progress Notes (Signed)
Vitals-CW  History reviewed. 

## 2020-11-26 NOTE — Patient Instructions (Signed)
YOU HAD AN ENDOSCOPIC PROCEDURE TODAY AT [facility]:   Refer to the procedure report that was given to you for any specific questions about what was found during the examination.  If the procedure report does not answer your questions, please call your gastroenterologist to clarify.  If you requested that your care partner not be given the details of your procedure findings, then the procedure report has been included in a sealed envelope for you to review at your convenience later.  YOU SHOULD EXPECT: Some feelings of bloating in the abdomen. Passage of more gas than usual.  Walking can help get rid of the air that was put into your GI tract during the procedure and reduce the bloating. If you had a lower endoscopy (such as a colonoscopy or flexible sigmoidoscopy) you may notice spotting of blood in your stool or on the toilet paper. If you underwent a bowel prep for your procedure, you may not have a normal bowel movement for a few days.  Please Note:  You might notice some irritation and congestion in your nose or some drainage.  This is from the oxygen used during your procedure.  There is no need for concern and it should clear up in a day or so.  SYMPTOMS TO REPORT IMMEDIATELY:  Following lower endoscopy (colonoscopy or flexible sigmoidoscopy):  Excessive amounts of blood in the stool  Significant tenderness or worsening of abdominal pains  Swelling of the abdomen that is new, acute  Fever of 100F or higher   For urgent or emergent issues, a gastroenterologist can be reached at any hour by calling [phone]. Do not use MyChart messaging for urgent concerns.    DIET:  We do recommend a small meal at first, but then you may proceed to your regular diet.  Drink plenty of fluids but you should avoid alcoholic beverages for 24 hours.  ACTIVITY:  You should plan to take it easy for the rest of today and you should NOT DRIVE or use heavy machinery until tomorrow (because  of the sedation medicines used during the test).    FOLLOW UP: Our staff will call the number listed on your records 48-72 hours following your procedure to check on you and address any questions or concerns that you may have regarding the information given to you following your procedure. If we do not reach you, we will leave a message.  We will attempt to reach you two times.  During this call, we will ask if you have developed any symptoms of COVID 19. If you develop any symptoms (ie: fever, flu-like symptoms, shortness of breath, cough etc.) before then, please call [phone].  If you test positive for Covid 19 in the 2 weeks post procedure, please call and report this information to [place].    If any biopsies were taken you will be contacted by phone or by letter within the next 1-3 weeks.  Please call us at [phone] if you have not heard about the biopsies in 3 weeks.    SIGNATURES/CONFIDENTIALITY: You and/or your care partner have signed paperwork which will be entered into your electronic medical record.  These signatures attest to the fact that that the information above on your After Visit Summary has been reviewed and is understood.  Full responsibility of the confidentiality of this discharge information lies with you and/or your care-partner.    Resume plavix at prior dose tomorrow Saturday 11/27/20,resume remainder of medications today. Information given on polyps,diverticulosis  and hemorrhoids.

## 2020-11-26 NOTE — Progress Notes (Signed)
Report to PACU, RN, vss, BBS= Clear.  

## 2020-11-26 NOTE — Progress Notes (Signed)
Agree with plan RG 

## 2020-11-26 NOTE — Op Note (Signed)
[place] Patient Name: Breanna Obrien Procedure Date: 11/26/2020 12:52 PM MRN: [id number] Endoscopist: Jackquline Denmark , MD Age: 65 Referring MD:  Date of Birth: 12-28-1955 Gender: Female Account #: [id number] Procedure:                Colonoscopy Indications:              Positive fecal immunochemical test Medicines:                Monitored Anesthesia Care Procedure:                Pre-Anesthesia Assessment:                           - Prior to the procedure, a History and Physical                            was performed, and patient medications and                            allergies were reviewed. The patient's tolerance of                            previous anesthesia was also reviewed. The risks                            and benefits of the procedure and the sedation                            options and risks were discussed with the patient.                            All questions were answered, and informed consent                            was obtained. Prior Anticoagulants: The patient has                            taken Plavix (clopidogrel), last dose was 5 days                            prior to procedure. ASA Grade Assessment: II - A                            patient with mild systemic disease. After reviewing                            the risks and benefits, the patient was deemed in                            satisfactory condition to undergo the procedure.                           After obtaining informed consent, the colonoscope  was passed under direct vision. Throughout the                            procedure, the patient's blood pressure, pulse, and                            oxygen saturations were monitored continuously. The                            Olympus [id number]-[id number] (#[id number]) Colonoscope was                            introduced through the anus and advanced to the 2                            cm into the  ileum. The colonoscopy was performed                            without difficulty. The patient tolerated the                            procedure well. The quality of the bowel                            preparation was good. Scope In: 1:43:22 PM Scope Out: 1:55:57 PM Scope Withdrawal Time: 0 hours 8 minutes 48 seconds  Total Procedure Duration: 0 hours 12 minutes 35 seconds  Findings:                 Two sessile polyps were found in the rectum and mid                            sigmoid colon. The polyps were 2 to 4 mm in size.                            These polyps were removed with a cold biopsy                            forceps. Resection and retrieval were complete.                           A few medium-mouthed diverticula were found in the                            sigmoid colon, transverse colon and ascending colon.                           Non-bleeding external and internal hemorrhoids were                            found during retroflexion and during perianal exam.                            The hemorrhoids were  small and Grade II (internal                            hemorrhoids that prolapse but reduce spontaneously).                           The terminal ileum appeared normal.                           The exam was otherwise without abnormality on                            direct and retroflexion views. Complications:            No immediate complications. Estimated Blood Loss:     Estimated blood loss: none. Impression:               - Two 2 to 4 mm polyps in the rectum and in the mid                            sigmoid colon, removed with a cold biopsy forceps.                            Resected and retrieved.                           - Mild pancolonic diverticulosis.                           - Non-bleeding external and internal hemorrhoids.                           - The examined portion of the ileum was normal.                           - The examination was  otherwise normal on direct                            and retroflexion views. Recommendation:           - Patient has a contact number available for                            emergencies. The signs and symptoms of potential                            delayed complications were discussed with the                            patient. Return to normal activities tomorrow.                            Written discharge instructions were provided to the                            patient.                           -  Resume previous diet.                           - Continue present medications.                           - Resume Plavix (clopidogrel) at prior dose                            tomorrow.                           - Repeat colonoscopy for surveillance based on                            pathology results.                           - The findings and recommendations were discussed                            with the patient's family. Jackquline Denmark, MD 11/26/2020 2:01:40 PM This report has been signed electronically.

## 2020-12-01 ENCOUNTER — Telehealth: Payer: Self-pay

## 2020-12-01 ENCOUNTER — Other Ambulatory Visit: Payer: Self-pay

## 2020-12-01 MED FILL — Atenolol Tab 100 MG: ORAL | 30 days supply | Qty: 30 | Fill #1 | Status: AC

## 2020-12-01 NOTE — Telephone Encounter (Signed)
  Follow up Call-  Call back number 11/26/2020  Permission to leave phone message Yes  Some recent data might be hidden     Patient questions:  Do you have a fever, pain , or abdominal swelling? No. Pain Score  0 *  Have you tolerated food without any problems? Yes.    Have you been able to return to your normal activities? Yes.    Do you have any questions about your discharge instructions: Diet   No. Medications  No. Follow up visit  No.  Do you have questions or concerns about your Care? No.  Actions: * If pain score is 4 or above: No action needed, pain <4.   Have you developed a fever since your procedure? No   2.   Have you had an respiratory symptoms (SOB or cough) since your procedure? No   3.   Have you tested positive for COVID 19 since your procedure no   4.   Have you had any family members/close contacts diagnosed with the COVID 19 since your procedure?  No    If yes to any of these questions please route to Joylene John, RN and Joella Prince, RN

## 2020-12-03 ENCOUNTER — Other Ambulatory Visit: Payer: Self-pay

## 2020-12-11 ENCOUNTER — Encounter: Payer: Self-pay | Admitting: Gastroenterology

## 2021-01-02 MED FILL — Atenolol Tab 100 MG: ORAL | 30 days supply | Qty: 30 | Fill #2 | Status: AC

## 2021-01-03 ENCOUNTER — Other Ambulatory Visit: Payer: Self-pay

## 2021-01-05 ENCOUNTER — Other Ambulatory Visit: Payer: Self-pay

## 2021-01-07 ENCOUNTER — Other Ambulatory Visit: Payer: Self-pay

## 2021-01-31 ENCOUNTER — Other Ambulatory Visit: Payer: Self-pay | Admitting: Internal Medicine

## 2021-01-31 DIAGNOSIS — I152 Hypertension secondary to endocrine disorders: Secondary | ICD-10-CM

## 2021-01-31 DIAGNOSIS — E1159 Type 2 diabetes mellitus with other circulatory complications: Secondary | ICD-10-CM

## 2021-01-31 DIAGNOSIS — Z794 Long term (current) use of insulin: Secondary | ICD-10-CM

## 2021-02-01 ENCOUNTER — Other Ambulatory Visit: Payer: Self-pay

## 2021-02-02 ENCOUNTER — Other Ambulatory Visit: Payer: Self-pay

## 2021-02-02 MED ORDER — INSULIN DETEMIR 100 UNIT/ML ~~LOC~~ SOLN
30.0000 [IU] | Freq: Every day | SUBCUTANEOUS | 0 refills | Status: DC
  Filled 2021-02-02: qty 10, 33d supply, fill #0

## 2021-02-02 MED ORDER — HYDROCHLOROTHIAZIDE 25 MG PO TABS
25.0000 mg | ORAL_TABLET | Freq: Every day | ORAL | 0 refills | Status: DC
  Filled 2021-02-02: qty 30, 30d supply, fill #0

## 2021-02-02 MED ORDER — ATORVASTATIN CALCIUM 40 MG PO TABS
40.0000 mg | ORAL_TABLET | Freq: Every day | ORAL | 0 refills | Status: DC
  Filled 2021-02-02: qty 30, 30d supply, fill #0

## 2021-02-02 MED ORDER — ATENOLOL 100 MG PO TABS
100.0000 mg | ORAL_TABLET | Freq: Every day | ORAL | 0 refills | Status: DC
  Filled 2021-02-02: qty 30, 30d supply, fill #0

## 2021-02-04 ENCOUNTER — Other Ambulatory Visit: Payer: Self-pay

## 2021-02-04 ENCOUNTER — Ambulatory Visit: Payer: Self-pay | Attending: Internal Medicine | Admitting: Internal Medicine

## 2021-02-04 VITALS — BP 132/82 | HR 97 | Resp 16 | Wt 183.2 lb

## 2021-02-04 DIAGNOSIS — E785 Hyperlipidemia, unspecified: Secondary | ICD-10-CM

## 2021-02-04 DIAGNOSIS — E1159 Type 2 diabetes mellitus with other circulatory complications: Secondary | ICD-10-CM

## 2021-02-04 DIAGNOSIS — E1169 Type 2 diabetes mellitus with other specified complication: Secondary | ICD-10-CM

## 2021-02-04 DIAGNOSIS — I2510 Atherosclerotic heart disease of native coronary artery without angina pectoris: Secondary | ICD-10-CM

## 2021-02-04 DIAGNOSIS — Z23 Encounter for immunization: Secondary | ICD-10-CM

## 2021-02-04 DIAGNOSIS — I152 Hypertension secondary to endocrine disorders: Secondary | ICD-10-CM

## 2021-02-04 DIAGNOSIS — E669 Obesity, unspecified: Secondary | ICD-10-CM

## 2021-02-04 DIAGNOSIS — Z794 Long term (current) use of insulin: Secondary | ICD-10-CM

## 2021-02-04 LAB — POCT GLYCOSYLATED HEMOGLOBIN (HGB A1C): HbA1c, POC (controlled diabetic range): 7.6 % — AB (ref 0.0–7.0)

## 2021-02-04 LAB — GLUCOSE, POCT (MANUAL RESULT ENTRY): POC Glucose: 180 mg/dl — AB (ref 70–99)

## 2021-02-04 MED ORDER — CLOPIDOGREL BISULFATE 75 MG PO TABS
75.0000 mg | ORAL_TABLET | Freq: Every day | ORAL | 2 refills | Status: DC
  Filled 2021-02-04: qty 90, 90d supply, fill #0
  Filled 2021-05-06: qty 30, 30d supply, fill #0
  Filled 2021-06-13: qty 90, 90d supply, fill #0
  Filled 2021-06-13: qty 30, 30d supply, fill #1

## 2021-02-04 MED ORDER — ATORVASTATIN CALCIUM 40 MG PO TABS
40.0000 mg | ORAL_TABLET | Freq: Every day | ORAL | 2 refills | Status: DC
  Filled 2021-02-04 – 2021-03-15 (×2): qty 90, 90d supply, fill #0
  Filled 2021-03-15: qty 30, 30d supply, fill #0

## 2021-02-04 MED ORDER — ATENOLOL 100 MG PO TABS
100.0000 mg | ORAL_TABLET | Freq: Every day | ORAL | 2 refills | Status: DC
  Filled 2021-02-04: qty 90, 90d supply, fill #0
  Filled 2021-03-15: qty 30, 30d supply, fill #0
  Filled 2021-03-15: qty 90, 90d supply, fill #0
  Filled 2021-05-06: qty 30, 30d supply, fill #1
  Filled 2021-06-13: qty 30, 30d supply, fill #2
  Filled 2021-06-13: qty 90, 90d supply, fill #0

## 2021-02-04 MED ORDER — HYDROCHLOROTHIAZIDE 25 MG PO TABS
25.0000 mg | ORAL_TABLET | Freq: Every day | ORAL | 2 refills | Status: DC
  Filled 2021-02-04 – 2021-03-15 (×2): qty 90, 90d supply, fill #0
  Filled 2021-03-15: qty 30, 30d supply, fill #0
  Filled 2021-05-06: qty 30, 30d supply, fill #1
  Filled 2021-06-13: qty 30, 30d supply, fill #2
  Filled 2021-06-13: qty 90, 90d supply, fill #0

## 2021-02-04 NOTE — Patient Instructions (Addendum)
Increase Levemir to 31 units daily. Diabetes Mellitus and Nutrition, Adult When you have diabetes, or diabetes mellitus, it is very important to have healthy eating habits because your blood sugar (glucose) levels are greatly affected by what you eat and drink. Eating healthy foods in the right amounts, at about the same times every day, can help you: Control your blood glucose. Lower your risk of heart disease. Improve your blood pressure. Reach or maintain a healthy weight. What can affect my meal plan? Every person with diabetes is different, and each person has different needs for a meal plan. Your health care provider may recommend that you work with a dietitian to make a meal plan that is best for you. Your meal plan may vary depending on factors such as: The calories you need. The medicines you take. Your weight. Your blood glucose, blood pressure, and cholesterol levels. Your activity level. Other health conditions you have, such as heart or kidney disease. How do carbohydrates affect me? Carbohydrates, also called carbs, affect your blood glucose level more than any other type of food. Eating carbs naturally raises the amount of glucose in your blood. Carb counting is a method for keeping track of how many carbs you eat. Counting carbs is important to keep your blood glucose at a healthy level, especially if you use insulin or take certain oral diabetes medicines. It is important to know how many carbs you can safely have in each meal. This is different for every person. Your dietitian can help you calculate how many carbs you should have at each meal and for each snack. How does alcohol affect me? Alcohol can cause a sudden decrease in blood glucose (hypoglycemia), especially if you use insulin or take certain oral diabetes medicines. Hypoglycemia can be a life-threatening condition. Symptoms of hypoglycemia, such as sleepiness, dizziness, and confusion, are similar to symptoms of having  too much alcohol. Do not drink alcohol if: Your health care provider tells you not to drink. You are pregnant, may be pregnant, or are planning to become pregnant. If you drink alcohol: Do not drink on an empty stomach. Limit how much you use to: 0-1 drink a day for women. 0-2 drinks a day for men. Be aware of how much alcohol is in your drink. In the [place] one drink equals one 12 oz bottle of beer (355 mL), one 5 oz glass of wine (148 mL), or one 1 oz glass of hard liquor (44 mL). Keep yourself hydrated with water, diet soda, or unsweetened iced tea. Keep in mind that regular soda, juice, and other mixers may contain a lot of sugar and must be counted as carbs. What are tips for following this plan? Reading food labels Start by checking the serving size on the "Nutrition Facts" label of packaged foods and drinks. The amount of calories, carbs, fats, and other nutrients listed on the label is based on one serving of the item. Many items contain more than one serving per package. Check the total grams (g) of carbs in one serving. You can calculate the number of servings of carbs in one serving by dividing the total carbs by 15. For example, if a food has 30 g of total carbs per serving, it would be equal to 2 servings of carbs. Check the number of grams (g) of saturated fats and trans fats in one serving. Choose foods that have a low amount or none of these fats. Check the number of milligrams (mg) of salt (sodium) in  one serving. Most people should limit total sodium intake to less than 2,300 mg per day. Always check the nutrition information of foods labeled as "low-fat" or "nonfat." These foods may be higher in added sugar or refined carbs and should be avoided. Talk to your dietitian to identify your daily goals for nutrients listed on the label. Shopping Avoid buying canned, pre-made, or processed foods. These foods tend to be high in fat, sodium, and added sugar. Shop around the outside  edge of the grocery store. This is where you will most often find fresh fruits and vegetables, bulk grains, fresh meats, and fresh dairy. Cooking Use low-heat cooking methods, such as baking, instead of high-heat cooking methods like deep frying. Cook using healthy oils, such as olive, canola, or sunflower oil. Avoid cooking with butter, cream, or high-fat meats. Meal planning Eat meals and snacks regularly, preferably at the same times every day. Avoid going long periods of time without eating. Eat foods that are high in fiber, such as fresh fruits, vegetables, beans, and whole grains. Talk with your dietitian about how many servings of carbs you can eat at each meal. Eat 4-6 oz (112-168 g) of lean protein each day, such as lean meat, chicken, fish, eggs, or tofu. One ounce (oz) of lean protein is equal to: 1 oz (28 g) of meat, chicken, or fish. 1 egg.  cup (62 g) of tofu. Eat some foods each day that contain healthy fats, such as avocado, nuts, seeds, and fish. What foods should I eat? Fruits Berries. Apples. Oranges. Peaches. Apricots. Plums. Grapes. Mango. Papaya. Pomegranate. Kiwi. Cherries. Vegetables Lettuce. Spinach. Leafy greens, including kale, chard, collard greens, and mustard greens. Beets. Cauliflower. Cabbage. Broccoli. Carrots. Green beans. Tomatoes. Peppers. Onions. Cucumbers. Brussels sprouts. Grains Whole grains, such as whole-wheat or whole-grain bread, crackers, tortillas, cereal, and pasta. Unsweetened oatmeal. Quinoa. Brown or wild rice. Meats and other proteins Seafood. Poultry without skin. Lean cuts of poultry and beef. Tofu. Nuts. Seeds. Dairy Low-fat or fat-free dairy products such as milk, yogurt, and cheese. The items listed above may not be a complete list of foods and beverages you can eat. Contact a dietitian for more information. What foods should I avoid? Fruits Fruits canned with syrup. Vegetables Canned vegetables. Frozen vegetables with butter or  cream sauce. Grains Refined white flour and flour products such as bread, pasta, snack foods, and cereals. Avoid all processed foods. Meats and other proteins Fatty cuts of meat. Poultry with skin. Breaded or fried meats. Processed meat. Avoid saturated fats. Dairy Full-fat yogurt, cheese, or milk. Beverages Sweetened drinks, such as soda or iced tea. The items listed above may not be a complete list of foods and beverages you should avoid. Contact a dietitian for more information. Questions to ask a health care provider Do I need to meet with a diabetes educator? Do I need to meet with a dietitian? What number can I call if I have questions? When are the best times to check my blood glucose? Where to find more information: American Diabetes Association: diabetes.org Academy of Nutrition and Dietetics: www.eatright.[employer] of Diabetes and Digestive and Kidney Diseases: [employer] Association of Diabetes Care and Education Specialists: www.diabeteseducator.org Summary It is important to have healthy eating habits because your blood sugar (glucose) levels are greatly affected by what you eat and drink. A healthy meal plan will help you control your blood glucose and maintain a healthy lifestyle. Your health care provider may recommend that you work with a dietitian  to make a meal plan that is best for you. Keep in mind that carbohydrates (carbs) and alcohol have immediate effects on your blood glucose levels. It is important to count carbs and to use alcohol carefully. This information is not intended to replace advice given to you by your health care provider. Make sure you discuss any questions you have with your health care provider. Document Revised: 04/22/2019 Document Reviewed: 04/22/2019 Elsevier Patient Education  2021 [employer].

## 2021-02-04 NOTE — Progress Notes (Signed)
Patient ID: Breanna Obrien, female    DOB: [birth year]  MRN: [id number]  CC: Follow-up, Diabetes, and Medication Refill (Patient says she need all medication refilled.)   Subjective: Breanna Obrien is a 65 y.o. 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  DIABETES TYPE 2 Last A1C:   Results for orders placed or performed in visit on 02/04/21  POCT glucose (manual entry)  Result Value Ref Range   POC Glucose 180 (A) 70 - 99 mg/dl  POCT glycosylated hemoglobin (Hb A1C)  Result Value Ref Range   Hemoglobin A1C     HbA1c POC (<> result, manual entry)     HbA1c, POC (prediabetic range)     HbA1c, POC (controlled diabetic range) 7.6 (A) 0.0 - 7.0 %    Med Adherence:  '[x]'$  Yes Compliant with Glucotrol, Metformin, Farxiga, Levemir 30 units Medication side effects:  '[]'$  Yes    '[x]'$  No Home Monitoring?  '[x]'$  Yes -3-4x/day.  Does not have log with her    Home glucose results range: before meals 100-145, 2 hrs after meals 160-180 but after BF sometimes it is up to 225. Eats 2 slices rye bread in mornings Diet Adherence: '[x]'$  Yes    '[]'$  No Exercise: '[x]'$  Yes -walking 4x/wk for 15 mins Hypoglycemic episodes?: '[]'$  Yes    '[x]'$  No Numbness of the feet? '[]'$  Yes    '[x]'$  No Retinopathy hx? '[]'$  Yes    '[]'$  No Last eye exam:  Comments:   HYPERTENSION/CAD Currently taking: see medication list Med Adherence: '[x]'$  Yes  but out of Atenolol x 3 days.  Just RF this a.m.  Took other meds already this a.m Medication side effects: '[]'$  Yes    '[x]'$  No Adherence with salt restriction: '[x]'$  Yes    '[]'$  No Home Monitoring?: '[x]'$  Yes    '[]'$  No Monitoring Frequency: 120-132/70s Home BP results range:  SOB? '[]'$  Yes    '[x]'$  No Chest Pain?: '[]'$  Yes    '[x]'$  No Leg swelling?: '[]'$  Yes    '[x]'$  No Headaches?: '[]'$  Yes    '[x]'$  No Dizziness? '[]'$  Yes    '[x]'$  No Comments:    Had c-scope.  2 hyperplastic polyps removed.  She will be due again in 10 years  Agreeable to getting the flu vaccine today.  Patient Active Problem List   Diagnosis Date Noted   Antiplatelet or antithrombotic long-term use 11/12/2020   Positive fecal immunochemical test 11/12/2020   Abnormal mammogram 01/15/2017   Coronary artery disease involving native coronary artery of native heart without angina pectoris 01/15/2017   Hx of Bell's palsy 01/15/2017   Insomnia due to medical condition 10/24/2016   Depression 10/24/2016   Type 2 diabetes mellitus with complication, without long-term current use of insulin ([place]) 10/06/2016   Essential hypertension 10/06/2016   Ataxia    Hyperlipidemia    Bilateral carotid artery stenosis    CVA (cerebral vascular accident) ([place]) 10/05/2016   TIA (transient ischemic attack) 06/30/2016     Current Outpatient Medications on File Prior to Visit  Medication Sig Dispense Refill   ALPHA LIPOIC ACID PO Take 1 tablet by mouth daily. With green tea     amLODipine (NORVASC) 10 MG tablet TAKE 1 TABLET (10 MG TOTAL) BY MOUTH DAILY. 90 tablet 3   Ascorbic Acid (VITAMIN C PO)  Take 1 tablet by mouth daily.     atenolol (TENORMIN) 100 MG tablet Take 1 tablet (100 mg total) by mouth daily. 30 tablet 0   atorvastatin (LIPITOR) 40 MG tablet Take 1 tablet (40 mg total) by mouth daily. 30 tablet 0   calcium-vitamin D (OSCAL WITH D) 500-200 MG-UNIT tablet Take 1 tablet by mouth.     CHROMIUM PO Take 1 tablet by mouth daily.     clopidogrel (PLAVIX) 75 MG tablet TAKE 1 TABLET (75 MG TOTAL) BY MOUTH DAILY. 30 tablet 2   dapagliflozin propanediol (FARXIGA) 10 MG TABS tablet Take 1 tablet (10 mg total) by mouth daily. 90 tablet 3   Fish Oil-Cholecalciferol (FISH OIL + D3 PO) Take 1 tablet by mouth daily.     glipiZIDE (GLUCOTROL) 10 MG tablet TAKE 1 TABLET BY MOUTH TWICE DAILY BEFORE A MEAL 180 tablet 3   hydrochlorothiazide (HYDRODIURIL) 25 MG tablet Take 1 tablet (25  mg total) by mouth daily. 30 tablet 0   insulin detemir (LEVEMIR) 100 UNIT/ML injection Inject 0.3 mLs (30 Units total) into the skin at bedtime. 10 mL 0   Insulin Syringe-Needle U-100 (INSULIN SYRINGE .5CC/30GX1/2") 30G X 1/2" 0.5 ML MISC Use as directed to inject insulin. 100 each 6   losartan (COZAAR) 100 MG tablet TAKE 1 TABLET (100 MG TOTAL) BY MOUTH DAILY. 90 tablet 3   MAGNESIUM PO Take 1 tablet by mouth daily.     metFORMIN (GLUCOPHAGE) 1000 MG tablet TAKE 1 TABLET (1,000 MG TOTAL) BY MOUTH 2 (TWO) TIMES DAILY. 180 tablet 3   Multiple Vitamin (MULTIVITAMIN PO) Take 1 tablet by mouth daily.     No current facility-administered medications on file prior to visit.    Allergies  Allergen Reactions   Trazodone And Nefazodone     Makes her feel jittery    Social History   Socioeconomic History   Marital status: Married    Spouse name: Not on file   Number of children: 1   Years of education: Not on file   Highest education level: Not on file  Occupational History   Occupation: retired  Tobacco Use   Smoking status: Former    Types: Cigarettes    Quit date: 1996    Years since quitting: 26.7   Smokeless tobacco: Never  Vaping Use   Vaping Use: Never used  Substance and Sexual Activity   Alcohol use: No   Drug use: Never   Sexual activity: Yes    Birth control/protection: None, Post-menopausal  Other Topics Concern   Not on file  Social History Narrative   Not on file   Social Determinants of Health   Financial Resource Strain: Not on file  Food Insecurity: No Food Insecurity   Worried About Charity fundraiser in the Last Year: Never true   [place] in the Last Year: Never true  Transportation Needs: No Transportation Needs   Lack of Transportation (Medical): No   Lack of Transportation (Non-Medical): No  Physical Activity: Not on file  Stress: Not on file  Social Connections: Not on file  Intimate Partner Violence: Not on file    Family History   Problem Relation Age of Onset   Pancreatic cancer Mother    Diabetes Mother    Heart attack Father    Diabetes Brother    Liver disease Brother        drinker   Hypertension Brother    Hypertension Brother  HIV/AIDS Brother    Breast cancer Maternal Aunt    Rectal cancer Neg Hx    Colon cancer Neg Hx    Esophageal cancer Neg Hx     Past Surgical History:  Procedure Laterality Date   CHOLECYSTECTOMY     CORONARY ANGIOPLASTY WITH STENT PLACEMENT     TONSILLECTOMY      ROS: Review of Systems Negative except as stated above  PHYSICAL EXAM: BP 132/82   Pulse 97   Resp 16   Wt 183 lb 3.2 oz (83.1 kg)   SpO2 96%   BMI 32.45 kg/m   Wt Readings from Last 3 Encounters:  02/04/21 183 lb 3.2 oz (83.1 kg)  11/26/20 183 lb (83 kg)  11/18/20 183 lb 8 oz (83.2 kg)    Physical Exam  General appearance - alert, well appearing, and in no distress Mental status - normal mood, behavior, speech, dress, motor activity, and thought processes Neck - supple, no significant adenopathy Chest - clear to auscultation, no wheezes, rales or rhonchi, symmetric air entry Heart - normal rate, regular rhythm, normal S1, S2, no murmurs, rubs, clicks or gallops Extremities - peripheral pulses normal, no pedal edema, no clubbing or cyanosis   CMP Latest Ref Rng & Units 09/20/2020 07/25/2019 05/14/2018  Glucose 65 - 99 mg/dL 153(H) 112(H) 168(H)  BUN 8 - 27 mg/dL '24 16 18  '$ Creatinine 0.57 - 1.00 mg/dL 1.17(H) 0.99 1.05(H)  Sodium 134 - 144 mmol/L 141 138 138  Potassium 3.5 - 5.2 mmol/L 4.8 4.2 4.2  Chloride 96 - 106 mmol/L 100 98 97  CO2 20 - 29 mmol/L '23 22 23  '$ Calcium 8.7 - 10.3 mg/dL 10.2 10.0 10.0  Total Protein 6.0 - 8.5 g/dL 7.6 7.7 7.4  Total Bilirubin 0.0 - 1.2 mg/dL 0.3 0.3 0.3  Alkaline Phos 44 - 121 IU/L 95 87 92  AST 0 - 40 IU/L '30 26 26  '$ ALT 0 - 32 IU/L 58(H) 37(H) 36(H)   Lipid Panel     Component Value Date/Time   CHOL 163 09/20/2020 0854   TRIG 172 (H) 09/20/2020 0854    HDL 40 09/20/2020 0854   CHOLHDL 4.1 09/20/2020 0854   CHOLHDL 5.1 10/06/2016 0333   VLDL 31 10/06/2016 0333   LDLCALC 93 09/20/2020 0854    CBC    Component Value Date/Time   WBC 8.0 09/20/2020 0854   WBC 6.4 10/07/2016 0317   RBC 4.15 09/20/2020 0854   RBC 4.12 10/07/2016 0317   HGB 12.4 09/20/2020 0854   HCT 38.0 09/20/2020 0854   PLT 301 09/20/2020 0854   MCV 92 09/20/2020 0854   MCH 29.9 09/20/2020 0854   MCH 31.3 10/07/2016 0317   MCHC 32.6 09/20/2020 0854   MCHC 33.8 10/07/2016 0317   RDW 13.3 09/20/2020 0854   LYMPHSABS 1.2 10/05/2016 1203   MONOABS 0.3 10/05/2016 1203   EOSABS 0.2 10/05/2016 1203   BASOSABS 0.0 10/05/2016 1203    ASSESSMENT AND PLAN: 1. Type 2 diabetes mellitus with other circulatory complication, with long-term current use of insulin (HCC) A1c is slightly increased and not at goal.  However her blood sugar readings are not too bad.  I recommend that she increase the Levemir to 31 units daily and continue her oral medications including metformin, glipizide and Farxiga at current doses.  Advised that she may want to cut back on bread.  Continue regular exercise. - POCT glucose (manual entry) - POCT glycosylated hemoglobin (Hb A1C)  2. Hypertension  associated with diabetes ([place]) Close to goal.  She had been out of the atenolol which she got refill today.  No changes made in medicines.  She will continue atenolol, hydrochlorothiazide, amlodipine, and Cozaar - atenolol (TENORMIN) 100 MG tablet; Take 1 tablet (100 mg total) by mouth daily.  Dispense: 90 tablet; Refill: 2 - hydrochlorothiazide (HYDRODIURIL) 25 MG tablet; Take 1 tablet (25 mg total) by mouth daily.  Dispense: 90 tablet; Refill: 2  3. Coronary artery disease involving native coronary artery of native heart without angina pectoris Clinically stable.  Continue Plavix, atorvastatin - clopidogrel (PLAVIX) 75 MG tablet; TAKE 1 TABLET (75 MG TOTAL) BY MOUTH DAILY.  Dispense: 90 tablet; Refill:  2  4. Hyperlipidemia associated with type 2 diabetes mellitus (HCC) - atorvastatin (LIPITOR) 40 MG tablet; Take 1 tablet (40 mg total) by mouth daily.  Dispense: 90 tablet; Refill: 2  5. Obesity (BMI 30.0-34.9) See #1 above.  6. Need for influenza vaccination Given today.     Patient was given the opportunity to ask questions.  Patient verbalized understanding of the plan and was able to repeat key elements of the plan.   Orders Placed This Encounter  Procedures   POCT glucose (manual entry)   POCT glycosylated hemoglobin (Hb A1C)     Requested Prescriptions   Pending Prescriptions Disp Refills   atenolol (TENORMIN) 100 MG tablet 90 tablet 2    Sig: Take 1 tablet (100 mg total) by mouth daily.   hydrochlorothiazide (HYDRODIURIL) 25 MG tablet 90 tablet 2    Sig: Take 1 tablet (25 mg total) by mouth daily.   atorvastatin (LIPITOR) 40 MG tablet 90 tablet 2    Sig: Take 1 tablet (40 mg total) by mouth daily.   clopidogrel (PLAVIX) 75 MG tablet 90 tablet 2    Sig: TAKE 1 TABLET (75 MG TOTAL) BY MOUTH DAILY.    No follow-ups on file.  Karle Plumber, MD, FACP

## 2021-02-07 ENCOUNTER — Other Ambulatory Visit: Payer: Self-pay | Admitting: Internal Medicine

## 2021-02-07 ENCOUNTER — Other Ambulatory Visit: Payer: Self-pay

## 2021-02-07 MED ORDER — "INSULIN SYRINGE 30G X 5/16"" 0.5 ML MISC"
6 refills | Status: DC
  Filled 2021-02-07: qty 100, 25d supply, fill #0

## 2021-02-08 ENCOUNTER — Other Ambulatory Visit: Payer: Self-pay

## 2021-03-15 ENCOUNTER — Other Ambulatory Visit: Payer: Self-pay

## 2021-03-15 ENCOUNTER — Other Ambulatory Visit: Payer: Self-pay | Admitting: Internal Medicine

## 2021-03-15 DIAGNOSIS — E1159 Type 2 diabetes mellitus with other circulatory complications: Secondary | ICD-10-CM

## 2021-03-15 DIAGNOSIS — Z794 Long term (current) use of insulin: Secondary | ICD-10-CM

## 2021-03-16 NOTE — Telephone Encounter (Signed)
Requested medications are due for refill today.  yes  Requested medications are on the active medications list.  yes  Last refill. 02/02/2021  Future visit scheduled.   yes  Notes to clinic.  Per note from office visit of 02/04/2021, dose is changed to 31 units. Current Rx states 30 units.

## 2021-03-17 ENCOUNTER — Other Ambulatory Visit: Payer: Self-pay

## 2021-03-17 MED ORDER — INSULIN DETEMIR 100 UNIT/ML ~~LOC~~ SOLN
31.0000 [IU] | Freq: Every day | SUBCUTANEOUS | 2 refills | Status: DC
  Filled 2021-03-17: qty 10, 32d supply, fill #0
  Filled 2021-05-05: qty 10, 32d supply, fill #1
  Filled 2021-06-13: qty 10, 32d supply, fill #2
  Filled 2021-06-13: qty 10, 32d supply, fill #0

## 2021-03-18 ENCOUNTER — Other Ambulatory Visit: Payer: Self-pay

## 2021-05-06 ENCOUNTER — Other Ambulatory Visit: Payer: Self-pay

## 2021-05-09 ENCOUNTER — Other Ambulatory Visit: Payer: Self-pay

## 2021-06-10 ENCOUNTER — Telehealth: Payer: Self-pay

## 2021-06-10 ENCOUNTER — Ambulatory Visit: Payer: Medicare PPO | Attending: Internal Medicine | Admitting: Internal Medicine

## 2021-06-10 ENCOUNTER — Encounter: Payer: Self-pay | Admitting: Internal Medicine

## 2021-06-10 VITALS — BP 129/73 | HR 73

## 2021-06-10 DIAGNOSIS — I2510 Atherosclerotic heart disease of native coronary artery without angina pectoris: Secondary | ICD-10-CM

## 2021-06-10 DIAGNOSIS — I152 Hypertension secondary to endocrine disorders: Secondary | ICD-10-CM

## 2021-06-10 DIAGNOSIS — E1169 Type 2 diabetes mellitus with other specified complication: Secondary | ICD-10-CM | POA: Diagnosis not present

## 2021-06-10 DIAGNOSIS — Z794 Long term (current) use of insulin: Secondary | ICD-10-CM

## 2021-06-10 DIAGNOSIS — E1159 Type 2 diabetes mellitus with other circulatory complications: Secondary | ICD-10-CM | POA: Diagnosis not present

## 2021-06-10 DIAGNOSIS — Z23 Encounter for immunization: Secondary | ICD-10-CM

## 2021-06-10 DIAGNOSIS — E785 Hyperlipidemia, unspecified: Secondary | ICD-10-CM

## 2021-06-10 NOTE — Telephone Encounter (Signed)
-----   Message from [name], MD sent at 06/10/2021 10:55 AM EST ----- Please give her Welcome to Medicare visit with me in 6 weeks.

## 2021-06-10 NOTE — Telephone Encounter (Signed)
Pt has been scheduled and reminder has been mailed.  

## 2021-06-10 NOTE — Progress Notes (Signed)
Virtual Visit via Video Note  I connected with Breanna Obrien on 06/10/2021 at 10:25 AM by a video enabled telemedicine application and verified that I am speaking with the correct person using two identifiers.  Location: Patient: home Provider: office   I discussed the limitations of evaluation and management by telemedicine and the availability of in person appointments. The patient expressed understanding and agreed to proceed.  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  Patient was last seen 01/2021.  Purpose of today's visit is chronic disease management.  DM: Patient reports that she did not do well over the holidays with her eating habits.  She ate what ever she wanted and it reflected in her blood sugars.  However over the past 2 weeks she has been trying to get back on track with healthier eating.  Checks blood sugars 3-4 times a day.  Blood sugars in the mornings before breakfast for the past 2 mornings was 103 and 106.  Blood sugars 2 hours after meals 139-190. -She walks 3 to 4 days a week for 10 to 15 minutes weather permitting. -Reports compliance with taking Glucotrol, metformin, and Farxiga, and Levemir 31 units daily. -Last A1c was 7.6.  HL: Reports compliance with taking atorvastatin.  HTN/CAD: She checks blood pressure daily.  Reading this morning was 129/73 with pulse of 73.  Other recent readings were 125/74 and 131/71.  She limits salt in the foods.  Denies any chest pains, shortness of breath, lower extremity edema. -Compliant with atenolol, amlodipine, HCTZ, Cozaar, Plavix and Lipitor.  HM: Recently obtained Medicare.  She is due for pneumonia vaccine 15 and shingles vaccine.   Outpatient Encounter Medications as of 06/10/2021  Medication Sig   ALPHA LIPOIC ACID PO Take 1 tablet by mouth daily. With green tea   amLODipine (NORVASC) 10 MG  tablet TAKE 1 TABLET (10 MG TOTAL) BY MOUTH DAILY.   Ascorbic Acid (VITAMIN C PO) Take 1 tablet by mouth daily.   atenolol (TENORMIN) 100 MG tablet Take 1 tablet (100 mg total) by mouth daily.   atorvastatin (LIPITOR) 40 MG tablet Take 1 tablet (40 mg total) by mouth daily.   calcium-vitamin D (OSCAL WITH D) 500-200 MG-UNIT tablet Take 1 tablet by mouth.   CHROMIUM PO Take 1 tablet by mouth daily.   clopidogrel (PLAVIX) 75 MG tablet TAKE 1 TABLET (75 MG TOTAL) BY MOUTH DAILY.   dapagliflozin propanediol (FARXIGA) 10 MG TABS tablet Take 1 tablet (10 mg total) by mouth daily.   Fish Oil-Cholecalciferol (FISH OIL + D3 PO) Take 1 tablet by mouth daily.   glipiZIDE (GLUCOTROL) 10 MG tablet TAKE 1 TABLET BY MOUTH TWICE DAILY BEFORE A MEAL   hydrochlorothiazide (HYDRODIURIL) 25 MG tablet Take 1 tablet (25 mg total) by mouth daily.   insulin detemir (LEVEMIR) 100 UNIT/ML injection Inject 0.31 mLs (31 Units total) into the skin at bedtime.   Insulin Syringe-Needle U-100 (INSULIN SYRINGE .5CC/30GX5/16") 30G X 5/16" 0.5 ML MISC use as directed to inject insulin   losartan (COZAAR) 100 MG tablet TAKE 1 TABLET (100 MG TOTAL) BY MOUTH DAILY.   MAGNESIUM PO Take 1 tablet by mouth daily.   metFORMIN (GLUCOPHAGE) 1000 MG tablet TAKE 1 TABLET (1,000 MG TOTAL) BY MOUTH 2 (TWO) TIMES DAILY.   Multiple Vitamin (MULTIVITAMIN PO) Take 1 tablet by mouth daily.   No facility-administered encounter  medications on file as of 06/10/2021.     Observations/Objective: Older Hispanic female who appears in NAD.  Lab Results  Component Value Date   HGBA1C 7.6 (A) 02/04/2021     Chemistry      Component Value Date/Time   NA 141 09/20/2020 0854   K 4.8 09/20/2020 0854   CL 100 09/20/2020 0854   CO2 23 09/20/2020 0854   BUN 24 09/20/2020 0854   CREATININE 1.17 (H) 09/20/2020 0854      Component Value Date/Time   CALCIUM 10.2 09/20/2020 0854   ALKPHOS 95 09/20/2020 0854   AST 30 09/20/2020 0854   ALT 58 (H)  09/20/2020 0854   BILITOT 0.3 09/20/2020 0854     Lab Results  Component Value Date   WBC 8.0 09/20/2020   HGB 12.4 09/20/2020   HCT 38.0 09/20/2020   MCV 92 09/20/2020   PLT 301 09/20/2020     Assessment and Plan: 1. Type 2 diabetes mellitus with other circulatory complication, with long-term current use of insulin ([place]) Encouraged her to get back to healthy eating habits which she is already started doing. Reported blood sugar ranges are not bad.  However we will have her come to the lab to get her A1c checked. -Continue Levemir 31 units daily and oral medications including metformin, Farxiga and Glucotrol. - Hemoglobin A1c; Future  2. Hypertension associated with diabetes ([place]) At goal.  Continue current medications including atenolol, hydrochlorothiazide, amlodipine and Cozaar.  Continue to monitor blood pressure with goal being 130/80 or lower.  3. Coronary artery disease involving native coronary artery of native heart without angina pectoris Stable.  Continue Plavix, atenolol and atorvastatin.  4. Hyperlipidemia associated with type 2 diabetes mellitus ([place]) Continue atorvastatin.  5. Need for vaccination against Streptococcus pneumoniae Will plan to give PCV 15 on her next visit.  I also recommend that she check with her insurance to see whether shingles vaccine is covered.   Follow Up Instructions: 4-6 wks for Welcome to Medicare Visit.   I discussed the assessment and treatment plan with the patient. The patient was provided an opportunity to ask questions and all were answered. The patient agreed with the plan and demonstrated an understanding of the instructions.   The patient was advised to call back or seek an in-person evaluation if the symptoms worsen or if the condition fails to improve as anticipated.  I spent 17 minutes dedicated to the care of this patient on the date of this encounter to include previsit review of my last note, face to face time with pt  discussing diagnoses and post visit ordering of test.     Karle Plumber, MD

## 2021-06-13 ENCOUNTER — Other Ambulatory Visit: Payer: Self-pay

## 2021-06-13 ENCOUNTER — Ambulatory Visit: Payer: Medicare PPO | Attending: Internal Medicine

## 2021-06-13 DIAGNOSIS — E1159 Type 2 diabetes mellitus with other circulatory complications: Secondary | ICD-10-CM

## 2021-06-14 ENCOUNTER — Encounter: Payer: Self-pay | Admitting: Internal Medicine

## 2021-06-14 LAB — HEMOGLOBIN A1C
Est. average glucose Bld gHb Est-mCnc: 186 mg/dL
Hgb A1c MFr Bld: 8.1 % — ABNORMAL HIGH (ref 4.8–5.6)

## 2021-06-29 ENCOUNTER — Encounter: Payer: Self-pay | Admitting: Internal Medicine

## 2021-07-01 ENCOUNTER — Telehealth: Payer: Self-pay

## 2021-07-01 ENCOUNTER — Other Ambulatory Visit: Payer: Self-pay

## 2021-07-01 NOTE — Telephone Encounter (Signed)
MD portion/prescription for Farxiga 10mg  [place] to [place] today.

## 2021-07-04 NOTE — Telephone Encounter (Signed)
Noted  

## 2021-07-13 ENCOUNTER — Other Ambulatory Visit (HOSPITAL_COMMUNITY): Payer: Self-pay

## 2021-07-13 ENCOUNTER — Other Ambulatory Visit: Payer: Self-pay | Admitting: Internal Medicine

## 2021-07-13 DIAGNOSIS — E1159 Type 2 diabetes mellitus with other circulatory complications: Secondary | ICD-10-CM

## 2021-07-13 DIAGNOSIS — Z794 Long term (current) use of insulin: Secondary | ICD-10-CM

## 2021-07-13 MED ORDER — INSULIN DETEMIR 100 UNIT/ML ~~LOC~~ SOLN
31.0000 [IU] | Freq: Every day | SUBCUTANEOUS | 0 refills | Status: DC
  Filled 2021-07-13: qty 10, 30d supply, fill #0
  Filled 2021-07-18: qty 10, 32d supply, fill #0

## 2021-07-18 ENCOUNTER — Other Ambulatory Visit (HOSPITAL_COMMUNITY): Payer: Self-pay

## 2021-07-18 ENCOUNTER — Other Ambulatory Visit: Payer: Self-pay

## 2021-07-20 ENCOUNTER — Encounter: Payer: Self-pay | Admitting: Internal Medicine

## 2021-07-22 ENCOUNTER — Other Ambulatory Visit: Payer: Self-pay

## 2021-07-22 ENCOUNTER — Other Ambulatory Visit: Payer: Self-pay | Admitting: Pharmacist

## 2021-07-22 MED ORDER — INSULIN GLARGINE 100 UNIT/ML ~~LOC~~ SOLN
31.0000 [IU] | Freq: Every day | SUBCUTANEOUS | 3 refills | Status: DC
  Filled 2021-07-22: qty 10, 28d supply, fill #0
  Filled 2021-09-05: qty 10, 28d supply, fill #1

## 2021-07-25 ENCOUNTER — Other Ambulatory Visit: Payer: Self-pay

## 2021-07-26 ENCOUNTER — Other Ambulatory Visit: Payer: Self-pay

## 2021-07-26 ENCOUNTER — Ambulatory Visit: Payer: Medicare PPO | Attending: Internal Medicine | Admitting: Internal Medicine

## 2021-07-26 DIAGNOSIS — E669 Obesity, unspecified: Secondary | ICD-10-CM | POA: Diagnosis not present

## 2021-07-26 DIAGNOSIS — Z0001 Encounter for general adult medical examination with abnormal findings: Secondary | ICD-10-CM

## 2021-07-26 DIAGNOSIS — N393 Stress incontinence (female) (male): Secondary | ICD-10-CM

## 2021-07-26 DIAGNOSIS — Z7189 Other specified counseling: Secondary | ICD-10-CM

## 2021-07-26 DIAGNOSIS — E1169 Type 2 diabetes mellitus with other specified complication: Secondary | ICD-10-CM

## 2021-07-26 DIAGNOSIS — Z0000 Encounter for general adult medical examination without abnormal findings: Secondary | ICD-10-CM

## 2021-07-26 NOTE — Progress Notes (Signed)
Virtual Visit via Telephone Note  I connected with Breanna Obrien on 07/26/2021 at 12:55 PM by telephone and verified that I am speaking with the correct person using two identifiers  Location: Patient: home Provider: office  Participants: Myself Patient CMA: Ms. Sallyanne Havers I discussed the limitations, risks, security and privacy concerns of performing an evaluation and management service by telephone and the availability of in person appointments. I also discussed with the patient that there may be a patient responsible charge related to this service. The patient expressed understanding and agreed to proceed.   Subjective:    Breanna Obrien is a 66 y.o. female who presents for a Welcome to Medicare exam.  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 Review of Systems Endo: Patient reports she continues to do better with her eating habits.        Objective:    There were no vitals filed for this visit.There is no height or weight on file to calculate BMI.  Medications Outpatient Encounter Medications as of 07/26/2021  Medication Sig   insulin glargine (LANTUS) 100 UNIT/ML injection Inject 0.31 mLs (31 Units total) into the skin daily.   ALPHA LIPOIC ACID PO Take 1 tablet by mouth daily. With green tea   amLODipine (NORVASC) 10 MG tablet TAKE 1 TABLET (10 MG TOTAL) BY MOUTH DAILY.   Ascorbic Acid (VITAMIN C PO) Take 1 tablet by mouth daily.   atenolol (TENORMIN) 100 MG tablet Take 1 tablet (100 mg total) by mouth daily.   atorvastatin (LIPITOR) 40 MG tablet Take 1 tablet (40 mg total) by mouth daily.   calcium-vitamin D (OSCAL WITH D) 500-200 MG-UNIT tablet Take 1 tablet by mouth.   CHROMIUM PO Take 1 tablet by mouth daily.   clopidogrel (PLAVIX) 75 MG tablet TAKE 1 TABLET (75 MG TOTAL) BY MOUTH DAILY.   dapagliflozin propanediol (FARXIGA) 10 MG TABS tablet Take 1 tablet (10 mg total)  by mouth daily.   Fish Oil-Cholecalciferol (FISH OIL + D3 PO) Take 1 tablet by mouth daily.   glipiZIDE (GLUCOTROL) 10 MG tablet TAKE 1 TABLET BY MOUTH TWICE DAILY BEFORE A MEAL   hydrochlorothiazide (HYDRODIURIL) 25 MG tablet Take 1 tablet (25 mg total) by mouth daily.   Insulin Syringe-Needle U-100 (INSULIN SYRINGE .5CC/30GX5/16") 30G X 5/16" 0.5 ML MISC use as directed to inject insulin   losartan (COZAAR) 100 MG tablet TAKE 1 TABLET (100 MG TOTAL) BY MOUTH DAILY.   MAGNESIUM PO Take 1 tablet by mouth daily.   metFORMIN (GLUCOPHAGE) 1000 MG tablet TAKE 1 TABLET (1,000 MG TOTAL) BY MOUTH 2 (TWO) TIMES DAILY.   Multiple Vitamin (MULTIVITAMIN PO) Take 1 tablet by mouth daily.   No facility-administered encounter medications on file as of 07/26/2021.     History: Past Medical History:  Diagnosis Date   Allergy    Bell's palsy    CAD (coronary artery disease)    Diabetes mellitus without complication (HCC)    Gallstones    Heart disease    HLD (hyperlipidemia)    Hypertension    Mini stroke    TIA (transient ischemic attack) 06/30/2016   Past Surgical History:  Procedure Laterality Date   CHOLECYSTECTOMY     CORONARY ANGIOPLASTY WITH STENT PLACEMENT     TONSILLECTOMY      Family History  Problem Relation Age of Onset   Pancreatic cancer Mother  Diabetes Mother    Heart attack Father    Diabetes Brother    Liver disease Brother        drinker   Hypertension Brother    Hypertension Brother    HIV/AIDS Brother    Breast cancer Maternal Aunt    Rectal cancer Neg Hx    Colon cancer Neg Hx    Esophageal cancer Neg Hx    Social History   Occupational History   Occupation: retired  Tobacco Use   Smoking status: Former    Types: Cigarettes    Quit date: 1996    Years since quitting: 27.1   Smokeless tobacco: Never  Vaping Use   Vaping Use: Never used  Substance and Sexual Activity   Alcohol use: No   Drug use: Never   Sexual activity: Yes    Birth  control/protection: None, Post-menopausal    Tobacco Counseling Pt does not smoke or drink  Immunizations and Health Maintenance Immunization History  Administered Date(s) Administered   Influenza,inj,Quad PF,6+ Mos 02/08/2018, 04/10/2019, 02/04/2020, 02/04/2021   PFIZER(Purple Top)SARS-COV-2 Vaccination 07/26/2019, 08/16/2019, 06/05/2020   PNEUMOCOCCAL CONJUGATE-20 06/10/2021   Pneumococcal Polysaccharide-23 08/23/2017   Tdap 10/24/2016   Zoster Recombinat (Shingrix) 06/10/2021   Health Maintenance Due  Topic Date Due   COVID-19 Vaccine (4 - Booster for [place] series) 07/31/2020   FOOT EXAM  09/18/2020   OPHTHALMOLOGY EXAM  06/15/2021    Activities of Daily Living In your present state of health, do you have any difficulty performing the following activities: 07/26/2021 07/20/2021  Hearing? N N  Vision? N N  Difficulty concentrating or making decisions? N N  Walking or climbing stairs? N N  Dressing or bathing? N N  Doing errands, shopping? N N  Preparing Food and eating ? N N  Using the Toilet? N N  In the past six months, have you accidently leaked urine? Y Y  Do you have problems with loss of bowel control? N N  Managing your Medications? N Y  Managing your Finances? N Y  Housekeeping or managing your Housekeeping? N N  Some recent data might be hidden  Reports occasionally leakage of urine when she coughs or laughs  Advanced Directives: Does Patient Have a Medical Advance Directive?: No Would patient like information on creating a medical advance directive?: Yes (Inpatient - patient defers creating a medical advance directive at this time - Information given) (Will mail out to pt)    Assessment:    This is a routine wellness examination for this patient .   Vision/Hearing screen Pt will be referred for eye exam Pt denies issues with hearing.  Dietary issues and exercise activities discussed:      Goals      Weight (lb) < 200 lb (90.7 kg)     I want to  lose 15 lbs and eventually get off insulin.      Depression Screen PHQ 2/9 Scores 07/26/2021 02/04/2021 05/20/2020 09/19/2019  PHQ - 2 Score 0 0 0 0  PHQ- 9 Score - - - -     Fall Risk Fall Risk  07/26/2021  Falls in the past year? 0  Number falls in past yr: 0  Injury with Fall? 0  Risk for fall due to : No Fall Risks  Follow up -    Cognitive Function: MMSE - Mini Mental State Exam 07/26/2021  Orientation to time 5  Orientation to Place 5  Registration 3  Attention/ Calculation 5  Recall 3  Language- name 2 objects 2  Language- repeat 1  Language- follow 3 step command 3  Language- read & follow direction 1  Write a sentence 1  Copy design 1  Total score 30        Patient Care Team: [name], MD as PCP - General (Internal Medicine)     Plan:   1. Encounter for Medicare annual wellness exam  2. Advance directive discussed with patient Advance directive discussed with patient including what is a living will and healthcare power of attorney.  Patient would like further information.  I will have my CMA mail out advance directive packet to her.  Inform patient that should she execute a living will or healthcare power of attorney, she should keep a copy for her records and bring 1 for [place] to put in her electronic record.  3. Stress incontinence of urine Kegel's exercises discussed and encouraged.  Advised to wear pads.  4. Diabetes mellitus type 2 in obese (HCC) Continue to encourage healthy eating habits. - Ambulatory referral to Ophthalmology   I have personally reviewed and noted the following in the patients chart:   Medical and social history Use of alcohol, tobacco or illicit drugs  Current medications and supplements Functional ability and status Nutritional status Physical activity Advanced directives List of other physicians Hospitalizations, surgeries, and ER visits in previous 12 months Vitals Screenings to include cognitive, depression,  and falls Referrals and appointments  In addition, I have reviewed and discussed with patient certain preventive protocols, quality metrics, and best practice recommendations. A written personalized care plan for preventive services as well as general preventive health recommendations were provided to patient.  I spent 11 minutes on the phone completing this Medicare wellness visit with the patient.    Karle Plumber, MD 07/26/2021

## 2021-07-27 ENCOUNTER — Telehealth: Payer: Self-pay

## 2021-07-27 NOTE — Telephone Encounter (Signed)
-----   Message from [name], MD sent at 07/26/2021 10:58 AM EST ----- ?She needs routine follow-up visit scheduled for 4 months. ? ?

## 2021-08-23 ENCOUNTER — Other Ambulatory Visit: Payer: Self-pay

## 2021-08-23 ENCOUNTER — Encounter (HOSPITAL_BASED_OUTPATIENT_CLINIC_OR_DEPARTMENT_OTHER): Payer: Self-pay | Admitting: Emergency Medicine

## 2021-08-23 ENCOUNTER — Inpatient Hospital Stay (HOSPITAL_BASED_OUTPATIENT_CLINIC_OR_DEPARTMENT_OTHER)
Admission: EM | Admit: 2021-08-23 | Discharge: 2021-08-26 | DRG: 246 | Disposition: A | Discharge status: Home / Self Care | Payer: Medicare PPO | Attending: Family Medicine | Admitting: Family Medicine

## 2021-08-23 ENCOUNTER — Emergency Department (HOSPITAL_BASED_OUTPATIENT_CLINIC_OR_DEPARTMENT_OTHER): Payer: Medicare PPO

## 2021-08-23 DIAGNOSIS — R002 Palpitations: Secondary | ICD-10-CM | POA: Diagnosis not present

## 2021-08-23 DIAGNOSIS — R778 Other specified abnormalities of plasma proteins: Secondary | ICD-10-CM | POA: Diagnosis not present

## 2021-08-23 DIAGNOSIS — I479 Paroxysmal tachycardia, unspecified: Secondary | ICD-10-CM | POA: Diagnosis present

## 2021-08-23 DIAGNOSIS — G4733 Obstructive sleep apnea (adult) (pediatric): Secondary | ICD-10-CM | POA: Diagnosis not present

## 2021-08-23 DIAGNOSIS — R079 Chest pain, unspecified: Secondary | ICD-10-CM

## 2021-08-23 DIAGNOSIS — E785 Hyperlipidemia, unspecified: Secondary | ICD-10-CM | POA: Diagnosis not present

## 2021-08-23 DIAGNOSIS — Z7902 Long term (current) use of antithrombotics/antiplatelets: Secondary | ICD-10-CM

## 2021-08-23 DIAGNOSIS — E118 Type 2 diabetes mellitus with unspecified complications: Secondary | ICD-10-CM | POA: Diagnosis not present

## 2021-08-23 DIAGNOSIS — Z79899 Other long term (current) drug therapy: Secondary | ICD-10-CM

## 2021-08-23 DIAGNOSIS — R0683 Snoring: Secondary | ICD-10-CM | POA: Diagnosis not present

## 2021-08-23 DIAGNOSIS — Z7984 Long term (current) use of oral hypoglycemic drugs: Secondary | ICD-10-CM

## 2021-08-23 DIAGNOSIS — T82855A Stenosis of coronary artery stent, initial encounter: Principal | ICD-10-CM | POA: Diagnosis present

## 2021-08-23 DIAGNOSIS — I214 Non-ST elevation (NSTEMI) myocardial infarction: Secondary | ICD-10-CM | POA: Diagnosis present

## 2021-08-23 DIAGNOSIS — Z794 Long term (current) use of insulin: Secondary | ICD-10-CM

## 2021-08-23 DIAGNOSIS — I10 Essential (primary) hypertension: Secondary | ICD-10-CM | POA: Diagnosis not present

## 2021-08-23 DIAGNOSIS — I2510 Atherosclerotic heart disease of native coronary artery without angina pectoris: Secondary | ICD-10-CM | POA: Diagnosis not present

## 2021-08-23 DIAGNOSIS — I21A9 Other myocardial infarction type: Secondary | ICD-10-CM | POA: Diagnosis not present

## 2021-08-23 DIAGNOSIS — Z9861 Coronary angioplasty status: Secondary | ICD-10-CM | POA: Diagnosis not present

## 2021-08-23 DIAGNOSIS — E871 Hypo-osmolality and hyponatremia: Secondary | ICD-10-CM | POA: Diagnosis present

## 2021-08-23 DIAGNOSIS — Z888 Allergy status to other drugs, medicaments and biological substances status: Secondary | ICD-10-CM

## 2021-08-23 DIAGNOSIS — Z87891 Personal history of nicotine dependence: Secondary | ICD-10-CM

## 2021-08-23 DIAGNOSIS — Z7982 Long term (current) use of aspirin: Secondary | ICD-10-CM

## 2021-08-23 DIAGNOSIS — Z955 Presence of coronary angioplasty implant and graft: Secondary | ICD-10-CM | POA: Diagnosis not present

## 2021-08-23 DIAGNOSIS — E876 Hypokalemia: Secondary | ICD-10-CM | POA: Diagnosis present

## 2021-08-23 DIAGNOSIS — Z8249 Family history of ischemic heart disease and other diseases of the circulatory system: Secondary | ICD-10-CM

## 2021-08-23 DIAGNOSIS — Z833 Family history of diabetes mellitus: Secondary | ICD-10-CM

## 2021-08-23 DIAGNOSIS — Z8673 Personal history of transient ischemic attack (TIA), and cerebral infarction without residual deficits: Secondary | ICD-10-CM | POA: Diagnosis not present

## 2021-08-23 DIAGNOSIS — I480 Paroxysmal atrial fibrillation: Secondary | ICD-10-CM | POA: Diagnosis present

## 2021-08-23 DIAGNOSIS — I25110 Atherosclerotic heart disease of native coronary artery with unstable angina pectoris: Secondary | ICD-10-CM | POA: Diagnosis not present

## 2021-08-23 LAB — CBC WITH DIFFERENTIAL/PLATELET
Abs Immature Granulocytes: 0.03 10*3/uL (ref 0.00–0.07)
Basophils Absolute: 0 10*3/uL (ref 0.0–0.1)
Basophils Relative: 0 %
Eosinophils Absolute: 0.3 10*3/uL (ref 0.0–0.5)
Eosinophils Relative: 3 %
HCT: 38.3 % (ref 36.0–46.0)
Hemoglobin: 13 g/dL (ref 12.0–15.0)
Immature Granulocytes: 0 %
Lymphocytes Relative: 24 %
Lymphs Abs: 2.4 10*3/uL (ref 0.7–4.0)
MCH: 30.2 pg (ref 26.0–34.0)
MCHC: 33.9 g/dL (ref 30.0–36.0)
MCV: 88.9 fL (ref 80.0–100.0)
Monocytes Absolute: 0.8 10*3/uL (ref 0.1–1.0)
Monocytes Relative: 8 %
Neutro Abs: 6.3 10*3/uL (ref 1.7–7.7)
Neutrophils Relative %: 65 %
Platelets: 368 10*3/uL (ref 150–400)
RBC: 4.31 MIL/uL (ref 3.87–5.11)
RDW: 13.2 % (ref 11.5–15.5)
WBC: 9.8 10*3/uL (ref 4.0–10.5)
nRBC: 0 % (ref 0.0–0.2)

## 2021-08-23 LAB — TROPONIN I (HIGH SENSITIVITY)
Troponin I (High Sensitivity): 18 ng/L — ABNORMAL HIGH (ref <18)
Troponin I (High Sensitivity): 48 ng/L — ABNORMAL HIGH (ref <18)
Troponin I (High Sensitivity): 39 ng/L — ABNORMAL HIGH (ref <18)

## 2021-08-23 LAB — BASIC METABOLIC PANEL
Anion gap: 14 (ref 5–15)
BUN: 17 mg/dL (ref 8–23)
CO2: 23 mmol/L (ref 22–32)
Calcium: 9.4 mg/dL (ref 8.9–10.3)
Chloride: 96 mmol/L — ABNORMAL LOW (ref 98–111)
Creatinine, Ser: 1.01 mg/dL — ABNORMAL HIGH (ref 0.44–1.00)
GFR, Estimated: >60 mL/min (ref >60)
Glucose, Bld: 135 mg/dL — ABNORMAL HIGH (ref 70–99)
Potassium: 3 mmol/L — ABNORMAL LOW (ref 3.5–5.1)
Sodium: 133 mmol/L — ABNORMAL LOW (ref 135–145)

## 2021-08-23 LAB — GLUCOSE, CAPILLARY: Glucose-Capillary: 223 mg/dL — ABNORMAL HIGH (ref 70–99)

## 2021-08-23 LAB — HEPARIN LEVEL (UNFRACTIONATED): Heparin Unfractionated: 0.25 IU/mL — ABNORMAL LOW (ref 0.30–0.70)

## 2021-08-23 LAB — HEMOGLOBIN A1C
Hgb A1c MFr Bld: 7.4 % — ABNORMAL HIGH (ref 4.8–5.6)
Mean Plasma Glucose: 165.68 mg/dL

## 2021-08-23 MED ORDER — ASPIRIN 81 MG PO CHEW
324.0000 mg | CHEWABLE_TABLET | Freq: Once | ORAL | Status: AC
  Administered 2021-08-23: 324 mg via ORAL
  Filled 2021-08-23: qty 4

## 2021-08-23 MED ORDER — HYDROCHLOROTHIAZIDE 25 MG PO TABS
25.0000 mg | ORAL_TABLET | Freq: Every day | ORAL | Status: DC
  Administered 2021-08-23 – 2021-08-25 (×3): 25 mg via ORAL
  Filled 2021-08-23 (×3): qty 1

## 2021-08-23 MED ORDER — NITROGLYCERIN 0.4 MG SL SUBL: 0.4000 mg | SUBLINGUAL_TABLET | SUBLINGUAL | Status: DC | PRN

## 2021-08-23 MED ORDER — INSULIN GLARGINE-YFGN 100 UNIT/ML ~~LOC~~ SOLN
30.0000 [IU] | Freq: Every day | SUBCUTANEOUS | Status: DC
  Administered 2021-08-23 – 2021-08-26 (×3): 30 [IU] via SUBCUTANEOUS
  Filled 2021-08-23 (×5): qty 0.3

## 2021-08-23 MED ORDER — HEPARIN BOLUS VIA INFUSION
4000.0000 [IU] | Freq: Once | INTRAVENOUS | Status: AC
  Administered 2021-08-23: 4000 [IU] via INTRAVENOUS

## 2021-08-23 MED ORDER — INSULIN ASPART 100 UNIT/ML IJ SOLN
0.0000 [IU] | Freq: Every day | INTRAMUSCULAR | Status: DC
  Administered 2021-08-23: 2 [IU] via SUBCUTANEOUS

## 2021-08-23 MED ORDER — HEPARIN (PORCINE) 25000 UT/250ML-% IV SOLN
1400.0000 [IU]/h | INTRAVENOUS | Status: DC
  Administered 2021-08-23: 850 [IU]/h via INTRAVENOUS
  Administered 2021-08-25: 1400 [IU]/h via INTRAVENOUS
  Filled 2021-08-23 (×3): qty 250

## 2021-08-23 MED ORDER — METOPROLOL TARTRATE 25 MG PO TABS
12.5000 mg | ORAL_TABLET | Freq: Once | ORAL | Status: AC
  Administered 2021-08-23: 12.5 mg via ORAL
  Filled 2021-08-23: qty 1

## 2021-08-23 MED ORDER — ASPIRIN 81 MG PO CHEW
324.0000 mg | CHEWABLE_TABLET | ORAL | Status: AC
  Administered 2021-08-23: 324 mg via ORAL
  Filled 2021-08-23: qty 4

## 2021-08-23 MED ORDER — ACETAMINOPHEN 325 MG PO TABS: 650.0000 mg | ORAL_TABLET | ORAL | Status: DC | PRN

## 2021-08-23 MED ORDER — METOPROLOL TARTRATE 5 MG/5ML IV SOLN
2.5000 mg | Freq: Once | INTRAVENOUS | Status: DC
Start: 2021-08-23 — End: 2021-08-23

## 2021-08-23 MED ORDER — POTASSIUM CHLORIDE 20 MEQ PO PACK
60.0000 meq | PACK | ORAL | Status: AC
  Administered 2021-08-23: 60 meq via ORAL
  Filled 2021-08-23: qty 3

## 2021-08-23 MED ORDER — ATENOLOL 25 MG PO TABS
100.0000 mg | ORAL_TABLET | Freq: Every day | ORAL | Status: DC
  Administered 2021-08-23 – 2021-08-25 (×3): 100 mg via ORAL
  Filled 2021-08-23: qty 1
  Filled 2021-08-23: qty 4
  Filled 2021-08-23: qty 1

## 2021-08-23 MED ORDER — LOSARTAN POTASSIUM 50 MG PO TABS
100.0000 mg | ORAL_TABLET | Freq: Every day | ORAL | Status: DC
  Administered 2021-08-23 – 2021-08-25 (×3): 100 mg via ORAL
  Filled 2021-08-23 (×3): qty 2

## 2021-08-23 MED ORDER — ASPIRIN 300 MG RE SUPP
300.0000 mg | RECTAL | Status: AC
  Filled 2021-08-23: qty 1

## 2021-08-23 MED ORDER — AMLODIPINE BESYLATE 10 MG PO TABS
10.0000 mg | ORAL_TABLET | Freq: Every day | ORAL | Status: DC
  Administered 2021-08-23 – 2021-08-25 (×3): 10 mg via ORAL
  Filled 2021-08-23 (×3): qty 1

## 2021-08-23 MED ORDER — ASPIRIN EC 81 MG PO TBEC
81.0000 mg | DELAYED_RELEASE_TABLET | Freq: Every day | ORAL | Status: DC
  Administered 2021-08-25: 81 mg via ORAL
  Filled 2021-08-23 (×2): qty 1

## 2021-08-23 MED ORDER — INSULIN ASPART 100 UNIT/ML IJ SOLN
0.0000 [IU] | Freq: Three times a day (TID) | INTRAMUSCULAR | Status: DC
  Administered 2021-08-24 (×2): 2 [IU] via SUBCUTANEOUS
  Administered 2021-08-25: 5 [IU] via SUBCUTANEOUS
  Administered 2021-08-25: 2 [IU] via SUBCUTANEOUS
  Administered 2021-08-26: 3 [IU] via SUBCUTANEOUS

## 2021-08-23 MED ORDER — ATORVASTATIN CALCIUM 40 MG PO TABS
40.0000 mg | ORAL_TABLET | Freq: Every day | ORAL | Status: DC
  Administered 2021-08-23 – 2021-08-25 (×3): 40 mg via ORAL
  Filled 2021-08-23 (×3): qty 1

## 2021-08-23 MED ORDER — CLOPIDOGREL BISULFATE 75 MG PO TABS
75.0000 mg | ORAL_TABLET | Freq: Every day | ORAL | Status: DC
Start: 2021-08-23 — End: 2021-08-25
  Administered 2021-08-23 – 2021-08-25 (×3): 75 mg via ORAL
  Filled 2021-08-23 (×3): qty 1

## 2021-08-23 MED ORDER — ONDANSETRON HCL 4 MG/2ML IJ SOLN
4.0000 mg | Freq: Four times a day (QID) | INTRAMUSCULAR | Status: DC | PRN
Start: 2021-08-23 — End: 2021-08-26

## 2021-08-23 NOTE — ED Notes (Signed)
Food tray given to pt

## 2021-08-23 NOTE — ED Notes (Signed)
Pt ambulated to restroom without difficulty

## 2021-08-23 NOTE — Progress Notes (Signed)
Heparin drip is at 10 ml/hr and pump was verified by CN Sharmon Leyden and Fritzi Mandes. ?

## 2021-08-23 NOTE — ED Notes (Signed)
Pt offered food/drink with Dr. Mollie Germany approval, meal provided. ?

## 2021-08-23 NOTE — Progress Notes (Signed)
ANTICOAGULATION CONSULT NOTE - Initial Consult ? ?Pharmacy Consult for Heparin ?Indication: chest pain/ACS ? ?Allergies  ?Allergen Reactions  ? Trazodone And Nefazodone   ?  Makes her feel jittery  ? ? ?Patient Measurements: ?Height: '5\' 3"'$  (160 cm) ?Weight: 79.4 kg (175 lb) ?IBW/kg (Calculated) : 52.4 ?Heparin Dosing Weight: 69.7kg ? ?Vital Signs: ?Temp: 98.4 ?F (36.9 ?C) (03/28 0316) ?Temp Source: Oral (03/28 0316) ?BP: 121/63 (03/28 0700) ?Pulse Rate: 83 (03/28 0700) ? ?Labs: ?Recent Labs  ?  08/23/21 ?7673 08/23/21 ?4193  ?HGB 13.0  --   ?HCT 38.3  --   ?PLT 368  --   ?CREATININE 1.01*  --   ?TROPONINIHS 18* 48*  ? ? ?Estimated Creatinine Clearance: 55.4 mL/min (A) (by C-G formula based on SCr of 1.01 mg/dL (H)). ? ? ?Medical History: ?Past Medical History:  ?Diagnosis Date  ? Allergy   ? Bell's palsy   ? CAD (coronary artery disease)   ? Diabetes mellitus without complication ([place])   ? Gallstones   ? Heart disease   ? HLD (hyperlipidemia)   ? Hypertension   ? Mini stroke   ? TIA (transient ischemic attack) 06/30/2016  ? ? ?Assessment: ?66 yo female presented on 08/23/2021 with tachycardia. Pharmacy consulted for heparin for ACS. Troponin 48. Hgb 13. Plt wnl.  ? ?Goal of Therapy:  ?Heparin level 0.3-0.7 units/ml ?Monitor platelets by anticoagulation protocol: Yes ?  ?Plan:  ?Heparin 4000 units x1 bolus  ?Start heparin 850 units/hr  ?Check 6 hr heparin level ?Monitor heparin level, CBC and s/s of bleeding daily  ? ?Cristela Felt, PharmD, BCPS ?Clinical Pharmacist ?08/23/2021 7:12 AM ? ? ? ?

## 2021-08-23 NOTE — ED Triage Notes (Signed)
Pt states she has been having problems for the past week with her heart rate off and on for the past week  Pt states it will go up and then come back down  Pt states this morning she woke up shaky  States she checked her blood sugar and it was low so she drank orange juice but states her heart rate was high and it scared her    ?

## 2021-08-23 NOTE — ED Provider Notes (Signed)
?[facility] EMERGENCY DEPARTMENT ?Provider Note ? ? ?CSN: [id number] ?Arrival date & time: 08/23/21  0305 ? ?  ? ?History ? ?Chief Complaint  ?Patient presents with  ? Tachycardia  ? ? ?Breanna Obrien is a 66 y.o. female. ? ?The history is provided by the patient.  ?She has history of hypertension, diabetes, hyperlipidemia, coronary artery disease, TIA and comes in because her heart was racing.  She woke up this morning feeling like her heart was racing and she was sweaty.  She thought that she might be hypoglycemic so she checked her sugar which was 61 and she ate something but did not feel any better.  She checked her blood pressure and it was elevated.  Her heart rate has been running at about 140 when she checked it.  There was some slight discomfort in her chest which she described as a pressure feeling which has since resolved.  She denies any nausea or vomiting but did have some diaphoresis.  She is a non-smoker, but there is a strong family history of premature coronary atherosclerosis. ?  ?Home Medications ?Prior to Admission medications   ?Medication Sig Start Date End Date Taking? Authorizing Provider  ?insulin glargine (LANTUS) 100 UNIT/ML injection Inject 0.31 mLs (31 Units total) into the skin daily. 07/22/21   [name], MD  ?ALPHA LIPOIC ACID PO Take 1 tablet by mouth daily. With green tea 08/27/17   [provider]  ?amLODipine (NORVASC) 10 MG tablet TAKE 1 TABLET (10 MG TOTAL) BY MOUTH DAILY. 09/28/20 09/28/21  [name], MD  ?Ascorbic Acid (VITAMIN C PO) Take 1 tablet by mouth daily. 08/27/17   [provider]  ?atenolol (TENORMIN) 100 MG tablet Take 1 tablet (100 mg total) by mouth daily. 02/04/21   [name], MD  ?atorvastatin (LIPITOR) 40 MG tablet Take 1 tablet (40 mg total) by mouth daily. 02/04/21   [name], MD  ?calcium-vitamin D (OSCAL WITH D) 500-200 MG-UNIT tablet Take 1 tablet by mouth.    [provider]  ?CHROMIUM PO Take 1  tablet by mouth daily. 08/27/17   [provider]  ?clopidogrel (PLAVIX) 75 MG tablet TAKE 1 TABLET (75 MG TOTAL) BY MOUTH DAILY. 02/04/21   [name], MD  ?dapagliflozin propanediol (FARXIGA) 10 MG TABS tablet Take 1 tablet (10 mg total) by mouth daily. 10/05/20   [name], MD  ?Fish Oil-Cholecalciferol (FISH OIL + D3 PO) Take 1 tablet by mouth daily. 08/27/17   [provider]  ?glipiZIDE (GLUCOTROL) 10 MG tablet TAKE 1 TABLET BY MOUTH TWICE DAILY BEFORE A MEAL 09/28/20 09/28/21  [name], MD  ?hydrochlorothiazide (HYDRODIURIL) 25 MG tablet Take 1 tablet (25 mg total) by mouth daily. 02/04/21   [name], MD  ?Insulin Syringe-Needle U-100 (INSULIN SYRINGE .5CC/30GX5/16") 30G X 5/16" 0.5 ML MISC use as directed to inject insulin 02/07/21   [name], MD  ?losartan (COZAAR) 100 MG tablet TAKE 1 TABLET (100 MG TOTAL) BY MOUTH DAILY. 09/28/20 09/28/21  [name], MD  ?MAGNESIUM PO Take 1 tablet by mouth daily. 08/27/17   [provider]  ?metFORMIN (GLUCOPHAGE) 1000 MG tablet TAKE 1 TABLET (1,000 MG TOTAL) BY MOUTH 2 (TWO) TIMES DAILY. 09/28/20 09/28/21  [name], MD  ?Multiple Vitamin (MULTIVITAMIN PO) Take 1 tablet by mouth daily. 08/27/17   [provider]  ?   ? ?Allergies    ?Trazodone and nefazodone   ? ?Review  of Systems   ?Review of Systems  ?All other systems reviewed and are negative. ? ?Physical Exam ?Updated Vital Signs ?BP (!) 141/63   Pulse (!) 116   Temp 98.4 ?F (36.9 ?C) (Oral)   Resp 16   Ht '5\' 3"'$  (1.6 m)   Wt 79.4 kg   SpO2 100%   BMI 31.00 kg/m?  ?Physical Exam ?Vitals and nursing note reviewed.  ?66 year old female, resting comfortably and in no acute distress. Vital signs are significant for borderline elevated blood pressure, and moderately elevated heart rate. Oxygen saturation is 100%, which is normal. ?Head is normocephalic and atraumatic. PERRLA, EOMI. Oropharynx is clear. ?Neck is nontender and supple without  adenopathy or JVD. ?Back is nontender and there is no CVA tenderness. ?Lungs are clear without rales, wheezes, or rhonchi. ?Chest is nontender. ?Heart is tachycardic without murmur. ?Abdomen is soft, flat, nontender without masses or hepatosplenomegaly and peristalsis is normoactive. ?Extremities have no cyanosis or edema, full range of motion is present. ?Skin is warm and dry without rash. ?Neurologic: Mental status is normal, cranial nerves are intact, there are no motor or sensory deficits. ? ?ED Results / Procedures / Treatments   ?Labs ?(all labs ordered are listed, but only abnormal results are displayed) ?Labs Reviewed  ?BASIC METABOLIC PANEL - Abnormal; Notable for the following components:  ?    Result Value  ? Sodium 133 (*)   ? Potassium 3.0 (*)   ? Chloride 96 (*)   ? Glucose, Bld 135 (*)   ? Creatinine, Ser 1.01 (*)   ? All other components within normal limits  ?TROPONIN I (HIGH SENSITIVITY) - Abnormal; Notable for the following components:  ? Troponin I (High Sensitivity) 18 (*)   ? All other components within normal limits  ?CBC WITH DIFFERENTIAL/PLATELET  ?TROPONIN I (HIGH SENSITIVITY)  ? ? ?EKG ?EKG Interpretation ? ?Date/Time:  Tuesday August 23 2021 03:16:42 EDT ?Ventricular Rate:  133 ?PR Interval:  150 ?QRS Duration: 104 ?QT Interval:  318 ?QTC Calculation: 473 ?R Axis:   -1 ?Text Interpretation: Sinus tachycardia Probable left atrial enlargement Repol abnrm suggests ischemia, lateral leads When compared with ECG of 10/05/2016, HEART RATE has increased Confirmed by Delora Fuel ([id number]) on 08/23/2021 3:27:26 AM ? ?Radiology ?DG Chest Port 1 View ? ?Result Date: 08/23/2021 ?CLINICAL DATA:  Chest pain. EXAM: PORTABLE CHEST 1 VIEW COMPARISON:  03/28/2019 FINDINGS: The lungs are clear without focal pneumonia, edema, pneumothorax or pleural effusion. The cardiopericardial silhouette is within normal limits for size. Interstitial markings are diffusely coarsened with chronic features. The visualized bony  structures of the thorax are unremarkable. Telemetry leads overlie the chest. IMPRESSION: No active disease. Electronically Signed   By: Misty Stanley M.D.   On: 08/23/2021 05:01   ? ?Procedures ?Procedures  ?Cardiac monitor shows normal sinus rhythm with occasional PVC, per my interpretation. ? ?Medications Ordered in ED ?Medications  ?aspirin chewable tablet 324 mg (has no administration in time range)  ?metoprolol tartrate (LOPRESSOR) injection 2.5 mg (has no administration in time range)  ? ? ?ED Course/ Medical Decision Making/ A&P ?  ?                        ?Medical Decision Making ?Amount and/or Complexity of Data Reviewed ?Labs: ordered. ?Radiology: ordered. ? ?Risk ?OTC drugs. ?Prescription drug management. ?Decision regarding hospitalization. ? ? ?Palpitations with episode of chest discomfort worrisome for episode of angina versus ACS.  ECG and cardiac monitor  shows sinus tachycardia.  She does have significant risk factors for heart disease.  Without troponin measurement, heart score is 6 which puts her at moderate risk for major adverse cardiac events.  Old records are reviewed, and she had an echocardiogram done in 2018 showing borderline diastolic dysfunction.  She is given aspirin and will be given a small dose of metoprolol to control her heart rate.  She will need troponin and delta troponin. ? ?Heart rate and blood pressure have come down to the normal range.  She has not had any recurrence of her chest discomfort.  Labs have come back showing hypokalemia with potassium 3.0, mild hyponatremia which is not felt to be clinically significant, and mildly elevated troponin to 18.  We will need to check repeat troponin.  Mild elevation of troponin increases her heart score to 7 which puts her at high risk for major adverse cardiac events in the next 6 weeks. ? ?Repeat troponin has increased to 48.  Patient continues to be pain-free.  She is started on heparin.  Case is discussed with Dr. Tamala Julian of [facility], who agrees to admit the patient. ? ?CRITICAL CARE ?Performed by: Delora Fuel ?Total critical care time: 60 minutes ?Critical care time was exclusive of separately billable procedures and treat

## 2021-08-23 NOTE — Plan of Care (Signed)
Transfer from [facility] ?Ms. Mitschke is a 66 year old female with pmh HTN, CAD s/p stents in '08, DM type II, and left-sided Bell's palsy who presented with c/o heart racing and some chest discomfort.  EKG noted sinus tachycardia with heart rates 133 bpm and concern for inverted T waves.  Labs significant for sodium 133, potassium 3, and high-sensitivity troponin 18->48.  Heart score reported to be 7.  Patient has been given full dose aspirin, metoprolol 12.5 mg p.o., and started on heparin drip.  Chest discomfort is reported to have been resolved.  Excepted to a cardiac telemetry bed. ?

## 2021-08-23 NOTE — ED Notes (Signed)
Report given to [facility]. 

## 2021-08-23 NOTE — H&P (Signed)
?History and Physical  ? ? ?Breanna Obrien IFO:[id number] DOB: January 06, 1956 DOA: 08/23/2021 ? ?PCP: [name], MD (Confirm with patient/family/NH records and if not entered, this has to be entered at [facility] point of entry) ?Patient coming from: Home ? ?I have personally briefly reviewed patient's old medical records in [place] ? ?Chief Complaint: Palpitations and chest pain ? ?HPI: Breanna Obrien is a 66 y.o. female with medical history significant of CAD with stenting x2 in 2008 in [place], refractory HTN, IDDM, CVA, Bell's palsy with left eyelid droop came in with worsening of palpitations and chest pains. ? ?Patient has history of CAD and 2 stents placed in in 2008 while she was in [place].  She moved to [facility] in 2018 and has not set up cardiology care since.  Her symptoms started 1 week ago with intermittent palpitations, reported episodes were randomly diastolic related to activity, It will last for few minutes.  Denies any associated symptoms of chest pain shortness of breath or sweating nauseous vomiting or lightheadedness.  Each episode lasted few minutes then heart rate recovered by itself back to baseline.  However Last Night, Patient Woke up with Severe Palpitations, She wears a Fitbit watch which showed her HR was 150s.  Blood pressure also elevated.  Also same time, she started to feel pressure-like chest pain, 4-5/10, centrally located, then she decided to come into the hospital for help.  Her chest pain subsided on arrival in the ED, she estimated the whole episode lasted less than 1 hour.  However she continued to experience palpitations. ? ?ED Course: Troponins 18> 48, EKG showed no acute ST changes. ? ?Review of Systems: As per HPI otherwise 14 point review of systems negative.  ? ? ?Past Medical History:  ?Diagnosis Date  ? Allergy   ? Bell's palsy   ? CAD (coronary artery disease)   ? Diabetes mellitus without complication ([place])   ? Gallstones   ? Heart disease   ? HLD  (hyperlipidemia)   ? Hypertension   ? Mini stroke   ? TIA (transient ischemic attack) 06/30/2016  ? ? ?Past Surgical History:  ?Procedure Laterality Date  ? CHOLECYSTECTOMY    ? CORONARY ANGIOPLASTY WITH STENT PLACEMENT    ? TONSILLECTOMY    ? ? ? reports that she quit smoking about 27 years ago. Her smoking use included cigarettes. She has never used smokeless tobacco. She reports that she does not drink alcohol and does not use drugs. ? ?Allergies  ?Allergen Reactions  ? Trazodone And Nefazodone Other (See Comments)  ?  Makes her feel jittery  ? ? ?Family History  ?Problem Relation Age of Onset  ? Pancreatic cancer Mother   ? Diabetes Mother   ? Heart attack Father   ? Diabetes Brother   ? Liver disease Brother   ?     drinker  ? Hypertension Brother   ? Hypertension Brother   ? HIV/AIDS Brother   ? Breast cancer Maternal Aunt   ? Rectal cancer Neg Hx   ? Colon cancer Neg Hx   ? Esophageal cancer Neg Hx   ? ? ? ?Prior to Admission medications   ?Medication Sig Start Date End Date Taking? Authorizing Provider  ?ALPHA LIPOIC ACID PO Take 1 tablet by mouth daily. With green tea 08/27/17  Yes [provider]  ?amLODipine (NORVASC) 10 MG tablet TAKE 1 TABLET (10 MG TOTAL) BY MOUTH DAILY. ?Patient taking differently: Take by mouth at bedtime. 09/28/20 09/28/21 Yes Johnson,  Dalbert Batman, MD  ?atenolol (TENORMIN) 100 MG tablet Take 1 tablet (100 mg total) by mouth daily. ?Patient taking differently: Take 100 mg by mouth at bedtime. 02/04/21  Yes [name], MD  ?atorvastatin (LIPITOR) 40 MG tablet Take 1 tablet (40 mg total) by mouth daily. ?Patient taking differently: Take 40 mg by mouth at bedtime. 02/04/21  Yes [name], MD  ?calcium-vitamin D (OSCAL WITH D) 500-200 MG-UNIT tablet Take 1 tablet by mouth.   Yes [provider]  ?CHROMIUM PO Take 1 tablet by mouth daily. 08/27/17  Yes [provider]  ?clopidogrel (PLAVIX) 75 MG tablet TAKE 1 TABLET (75 MG TOTAL) BY MOUTH DAILY. 02/04/21  Yes  [name], MD  ?dapagliflozin propanediol (FARXIGA) 10 MG TABS tablet Take 1 tablet (10 mg total) by mouth daily. 10/05/20  Yes [name], MD  ?Fish Oil-Cholecalciferol (FISH OIL + D3 PO) Take 1 tablet by mouth daily. 08/27/17  Yes [provider]  ?glipiZIDE (GLUCOTROL) 10 MG tablet TAKE 1 TABLET BY MOUTH TWICE DAILY BEFORE A MEAL ?Patient taking differently: Take 10 mg by mouth 2 (two) times daily before a meal. 09/28/20 09/28/21 Yes [name], MD  ?hydrochlorothiazide (HYDRODIURIL) 25 MG tablet Take 1 tablet (25 mg total) by mouth daily. ?Patient taking differently: Take 25 mg by mouth at bedtime. 02/04/21  Yes [name], MD  ?insulin glargine (LANTUS) 100 UNIT/ML injection Inject 0.31 mLs (31 Units total) into the skin daily. ?Patient taking differently: Inject 31 Units into the skin at bedtime. 07/22/21  Yes [name], MD  ?losartan (COZAAR) 100 MG tablet TAKE 1 TABLET (100 MG TOTAL) BY MOUTH DAILY. ?Patient taking differently: Take 100 mg by mouth at bedtime. 09/28/20 09/28/21 Yes [name], MD  ?MAGNESIUM PO Take 1 tablet by mouth daily. 08/27/17  Yes [provider]  ?metFORMIN (GLUCOPHAGE) 1000 MG tablet TAKE 1 TABLET (1,000 MG TOTAL) BY MOUTH 2 (TWO) TIMES DAILY. ?Patient taking differently: Take 1,000 mg by mouth 2 (two) times daily with a meal. 09/28/20 09/28/21 Yes [name], MD  ?Multiple Vitamin (MULTIVITAMIN PO) Take 1 tablet by mouth daily. 08/27/17  Yes [provider]  ?Ascorbic Acid (VITAMIN C PO) Take 1 tablet by mouth daily. 08/27/17   [provider]  ?Insulin Syringe-Needle U-100 (INSULIN SYRINGE .5CC/30GX5/16") 30G X 5/16" 0.5 ML MISC use as directed to inject insulin 02/07/21   [name], MD  ? ? ?Physical Exam: ?Vitals:  ? 08/23/21 1100 08/23/21 1300 08/23/21 1600 08/23/21 1730  ?BP: (!) 126/58 115/60 (!) 130/57 (!) 137/98  ?Pulse: 84 82 96 94  ?Resp: '13 18 15 18  '$ ?Temp:    98.3 ?F (36.8 ?C)  ?TempSrc:     Oral  ?SpO2: 97% 94% 98% 96%  ?Weight:    79.7 kg  ?Height:    '5\' 3"'$  (1.6 m)  ? ? ?Constitutional: NAD, calm, comfortable ?Vitals:  ? 08/23/21 1100 08/23/21 1300 08/23/21 1600 08/23/21 1730  ?BP: (!) 126/58 115/60 (!) 130/57 (!) 137/98  ?Pulse: 84 82 96 94  ?Resp: '13 18 15 18  '$ ?Temp:    98.3 ?F (36.8 ?C)  ?TempSrc:    Oral  ?SpO2: 97% 94% 98% 96%  ?Weight:    79.7 kg  ?Height:    '5\' 3"'$  (1.6 m)  ? ?Eyes: PERRL, lids and conjunctivae normal ?ENMT: Mucous membranes are moist. Posterior pharynx clear of any exudate or lesions.Normal dentition.  ?Neck: normal, supple, no  masses, no thyromegaly ?Respiratory: clear to auscultation bilaterally, no wheezing, no crackles. Normal respiratory effort. No accessory muscle use.  ?Cardiovascular: Regular rate and rhythm, no murmurs / rubs / gallops. No extremity edema. 2+ pedal pulses. No carotid bruits.  ?Abdomen: no tenderness, no masses palpated. No hepatosplenomegaly. Bowel sounds positive.  ?Musculoskeletal: no clubbing / cyanosis. No joint deformity upper and lower extremities. Good ROM, no contractures. Normal muscle tone.  ?Skin: no rashes, lesions, ulcers. No induration ?Neurologic: CN 2-12 grossly intact. Sensation intact, DTR normal. Strength 5/5 in all 4.  ?Psychiatric: Normal judgment and insight. Alert and oriented x 3. Normal mood.  ? ? ? ?Labs on Admission: I have personally reviewed following labs and imaging studies ? ?CBC: ?Recent Labs  ?Lab 08/23/21 ?5035  ?WBC 9.8  ?NEUTROABS 6.3  ?HGB 13.0  ?HCT 38.3  ?MCV 88.9  ?PLT 368  ? ?Basic Metabolic Panel: ?Recent Labs  ?Lab 08/23/21 ?4656  ?NA 133*  ?K 3.0*  ?CL 96*  ?CO2 23  ?GLUCOSE 135*  ?BUN 17  ?CREATININE 1.01*  ?CALCIUM 9.4  ? ?GFR: ?Estimated Creatinine Clearance: 55.5 mL/min (A) (by C-G formula based on SCr of 1.01 mg/dL (H)). ?Liver Function Tests: ?No results for input(s): AST, ALT, ALKPHOS, BILITOT, PROT, ALBUMIN in the last 168 hours. ?No results for input(s): LIPASE, AMYLASE in the last 168 hours. ?No  results for input(s): AMMONIA in the last 168 hours. ?Coagulation Profile: ?No results for input(s): INR, PROTIME in the last 168 hours. ?Cardiac Enzymes: ?No results for input(s): CKTOTAL, CKMB, CKMBINDEX, TROPONINI

## 2021-08-23 NOTE — Progress Notes (Signed)
ANTICOAGULATION CONSULT NOTE  ? ?Pharmacy Consult for Heparin ?Indication: chest pain/ACS ? ?Allergies  ?Allergen Reactions  ? Trazodone And Nefazodone   ?  Makes her feel jittery  ? ? ?Patient Measurements: ?Height: '5\' 3"'$  (160 cm) ?Weight: 79.4 kg (175 lb) ?IBW/kg (Calculated) : 52.4 ?Heparin Dosing Weight: 69.7kg ? ?Vital Signs: ?BP: 130/57 (03/28 1600) ?Pulse Rate: 96 (03/28 1600) ? ?Labs: ?Recent Labs  ?  08/23/21 ?6314 08/23/21 ?9702 08/23/21 ?1406  ?HGB 13.0  --   --   ?HCT 38.3  --   --   ?PLT 368  --   --   ?HEPARINUNFRC  --   --  0.25*  ?CREATININE 1.01*  --   --   ?TROPONINIHS 18* 48*  --   ? ? ? ?Estimated Creatinine Clearance: 55.4 mL/min (A) (by C-G formula based on SCr of 1.01 mg/dL (H)). ? ? ?Medical History: ?Past Medical History:  ?Diagnosis Date  ? Allergy   ? Bell's palsy   ? CAD (coronary artery disease)   ? Diabetes mellitus without complication ([place])   ? Gallstones   ? Heart disease   ? HLD (hyperlipidemia)   ? Hypertension   ? Mini stroke   ? TIA (transient ischemic attack) 06/30/2016  ? ? ?Assessment: ?66 yo female presented on 08/23/2021 with tachycardia. Pharmacy consulted for heparin for ACS. Troponin 48. Hgb 13. Plt wnl.  ? ?Initial heparin level subtherapeutic at 0.25 on 850 units/hr ? ?Goal of Therapy:  ?Heparin level 0.3-0.7 units/ml ?Monitor platelets by anticoagulation protocol: Yes ?  ?Plan:  ?Increase heparin gtt to 1000 units/hr ?F/u 6 hour heparin level ?F/u cards eval and recs ? ?Bertis Ruddy, PharmD ?Clinical Pharmacist ?ED Pharmacist Phone # [phone] ?08/23/2021 4:30 PM ? ? ? ? ?

## 2021-08-23 NOTE — ED Notes (Signed)
Attempted report. Rn to call back. ?

## 2021-08-24 ENCOUNTER — Inpatient Hospital Stay (HOSPITAL_COMMUNITY): Payer: Medicare PPO

## 2021-08-24 DIAGNOSIS — R002 Palpitations: Secondary | ICD-10-CM

## 2021-08-24 DIAGNOSIS — R0683 Snoring: Secondary | ICD-10-CM

## 2021-08-24 DIAGNOSIS — E118 Type 2 diabetes mellitus with unspecified complications: Secondary | ICD-10-CM | POA: Diagnosis not present

## 2021-08-24 DIAGNOSIS — R079 Chest pain, unspecified: Secondary | ICD-10-CM | POA: Diagnosis not present

## 2021-08-24 DIAGNOSIS — I25110 Atherosclerotic heart disease of native coronary artery with unstable angina pectoris: Secondary | ICD-10-CM

## 2021-08-24 DIAGNOSIS — G4733 Obstructive sleep apnea (adult) (pediatric): Secondary | ICD-10-CM

## 2021-08-24 DIAGNOSIS — I214 Non-ST elevation (NSTEMI) myocardial infarction: Secondary | ICD-10-CM | POA: Diagnosis not present

## 2021-08-24 DIAGNOSIS — I479 Paroxysmal tachycardia, unspecified: Secondary | ICD-10-CM

## 2021-08-24 DIAGNOSIS — R778 Other specified abnormalities of plasma proteins: Secondary | ICD-10-CM | POA: Diagnosis not present

## 2021-08-24 LAB — CBC
HCT: 36.2 % (ref 36.0–46.0)
Hemoglobin: 12 g/dL (ref 12.0–15.0)
MCH: 30 pg (ref 26.0–34.0)
MCHC: 33.1 g/dL (ref 30.0–36.0)
MCV: 90.5 fL (ref 80.0–100.0)
Platelets: 319 10*3/uL (ref 150–400)
RBC: 4 MIL/uL (ref 3.87–5.11)
RDW: 13.1 % (ref 11.5–15.5)
WBC: 6.8 10*3/uL (ref 4.0–10.5)
nRBC: 0 % (ref 0.0–0.2)

## 2021-08-24 LAB — BASIC METABOLIC PANEL
Anion gap: 10 (ref 5–15)
BUN: 13 mg/dL (ref 8–23)
CO2: 23 mmol/L (ref 22–32)
Calcium: 9.4 mg/dL (ref 8.9–10.3)
Chloride: 102 mmol/L (ref 98–111)
Creatinine, Ser: 0.81 mg/dL (ref 0.44–1.00)
GFR, Estimated: >60 mL/min (ref >60)
Glucose, Bld: 104 mg/dL — ABNORMAL HIGH (ref 70–99)
Potassium: 3.6 mmol/L (ref 3.5–5.1)
Sodium: 135 mmol/L (ref 135–145)

## 2021-08-24 LAB — GLUCOSE, CAPILLARY
Glucose-Capillary: 112 mg/dL — ABNORMAL HIGH (ref 70–99)
Glucose-Capillary: 131 mg/dL — ABNORMAL HIGH (ref 70–99)
Glucose-Capillary: 151 mg/dL — ABNORMAL HIGH (ref 70–99)

## 2021-08-24 LAB — HIV ANTIBODY (ROUTINE TESTING W REFLEX): HIV Screen 4th Generation wRfx: NONREACTIVE

## 2021-08-24 LAB — HEPARIN LEVEL (UNFRACTIONATED)
Heparin Unfractionated: 0.2 IU/mL — ABNORMAL LOW (ref 0.30–0.70)
Heparin Unfractionated: 0.22 IU/mL — ABNORMAL LOW (ref 0.30–0.70)

## 2021-08-24 LAB — TSH: TSH: 1.921 u[IU]/mL (ref 0.350–4.500)

## 2021-08-24 LAB — ECHOCARDIOGRAM COMPLETE
Area-P 1/2: 4.15 cm2
Calc EF: 49.9 %
Height: 63 in
S' Lateral: 2.8 cm
Single Plane A2C EF: 51.5 %
Single Plane A4C EF: 49 %
Weight: 2825.6 oz

## 2021-08-24 LAB — TROPONIN I (HIGH SENSITIVITY): Troponin I (High Sensitivity): 26 ng/L — ABNORMAL HIGH (ref <18)

## 2021-08-24 LAB — MAGNESIUM: Magnesium: 1.6 mg/dL — ABNORMAL LOW (ref 1.7–2.4)

## 2021-08-24 LAB — T4, FREE: Free T4: 1.05 ng/dL (ref 0.61–1.12)

## 2021-08-24 MED ORDER — SODIUM CHLORIDE 0.9 % IV SOLN: 250.0000 mL | INTRAVENOUS | Status: DC | PRN

## 2021-08-24 MED ORDER — SODIUM CHLORIDE 0.9 % IV SOLN: INTRAVENOUS | Status: DC | PRN

## 2021-08-24 MED ORDER — MAGNESIUM SULFATE 2 GM/50ML IV SOLN
2.0000 g | Freq: Once | INTRAVENOUS | Status: AC
Start: 2021-08-24 — End: 2021-08-24
  Administered 2021-08-24: 2 g via INTRAVENOUS
  Filled 2021-08-24: qty 50

## 2021-08-24 MED ORDER — SODIUM CHLORIDE 0.9 % WEIGHT BASED INFUSION
3.0000 mL/kg/h | INTRAVENOUS | Status: DC
  Administered 2021-08-25: 3 mL/kg/h via INTRAVENOUS

## 2021-08-24 MED ORDER — SODIUM CHLORIDE 0.9% FLUSH: 3.0000 mL | INTRAVENOUS | Status: DC | PRN

## 2021-08-24 MED ORDER — SODIUM CHLORIDE 0.9 % WEIGHT BASED INFUSION: 1.0000 mL/kg/h | INTRAVENOUS | Status: DC

## 2021-08-24 MED ORDER — SODIUM CHLORIDE 0.9% FLUSH
3.0000 mL | Freq: Two times a day (BID) | INTRAVENOUS | Status: DC
  Administered 2021-08-24 – 2021-08-25 (×3): 3 mL via INTRAVENOUS

## 2021-08-24 NOTE — Progress Notes (Signed)
?  Echocardiogram ?2D Echocardiogram has been performed. ? ?[name] ?08/24/2021, 10:54 AM ?

## 2021-08-24 NOTE — Assessment & Plan Note (Signed)
--   CBG stable.  Continue sliding scale insulin, Lantus. ?

## 2021-08-24 NOTE — Assessment & Plan Note (Signed)
--  possible dx. Suggest outpatient sleep study ?

## 2021-08-24 NOTE — Hospital Course (Addendum)
66 year old woman PMH CAD presented with palpitations, heart racing.  Admitted for further evaluation of the same as well as elevated troponins. S/p LHC w/ stent and angioplasty 3/30. Recommend uninterrupted dual antiplatelet therapy with Aspirin '81mg'$  daily and Clopidogrel '75mg'$  daily for a minimum of 12 months. ? ?CAD/NSTEMI: Multiple layers of stent in RCA. Intensify antiplatelet regimen to Brilinta and DC Plavix. Phase 1 rehab. Potential DC later today if ambulates well. ?  ?Palpitations: Needs long term monitor, ?  ?  ?Okay to DC after ambulation. Need to arrange 4 week monitor.   ?

## 2021-08-24 NOTE — TOC Progression Note (Addendum)
Transition of Care (TOC) - Progression Note  ? ? ?Patient Details  ?Name: Breanna Obrien ?MRN: [id number] ?Date of Birth: 01-25-1956 ? ?Transition of Care (TOC) CM/SW Contact  ?Zenon Mayo, RN ?Phone Number: ?08/24/2021, 3:55 PM ? ?Clinical Narrative:    ?from home, chest pain, palpitations for stress test, echo pending,  plan for heart cath tomorrow per patient. Spouse will transport home at dc.  TOC will continue to follow for dc needs. ? ? ?  ?  ? ?Expected Discharge Plan and Services ?  ?  ?  ?  ?  ?                ?  ?  ?  ?  ?  ?  ?  ?  ?  ?  ? ? ?Social Determinants of Health (SDOH) Interventions ?  ? ?Readmission Risk Interventions ?   ? View : No data to display.  ?  ?  ?  ? ? ?

## 2021-08-24 NOTE — Assessment & Plan Note (Addendum)
--   With elevated troponins, palpitations concerning for paroxysmal sinus tachycardia by history. ?-- s/p LHC w/ balloon and stent ?

## 2021-08-24 NOTE — Progress Notes (Signed)
ANTICOAGULATION CONSULT NOTE ? ?Pharmacy Consult for Heparin ?Indication: chest pain/ACS ? ?Allergies  ?Allergen Reactions  ? Trazodone And Nefazodone Other (See Comments)  ?  Makes her feel jittery  ? ? ?Patient Measurements: ?Height: '5\' 3"'$  (160 cm) ?Weight: 80.1 kg (176 lb 9.6 oz) ?IBW/kg (Calculated) : 52.4 ? ?Heparin Dosing Weight: 70 kg ? ?Vital Signs: ?Temp: 97.8 ?F (36.6 ?C) (03/29 1156) ?Temp Source: Oral (03/29 1156) ?BP: 126/63 (03/29 1156) ?Pulse Rate: 66 (03/29 1156) ? ?Labs: ?Recent Labs  ?  08/23/21 ?1443 08/23/21 ?1540 08/23/21 ?1406 08/23/21 ?1812 08/23/21 ?2310 08/24/21 ?0424 08/24/21 ?1437  ?HGB 13.0  --   --   --   --  12.0  --   ?HCT 38.3  --   --   --   --  36.2  --   ?PLT 368  --   --   --   --  319  --   ?HEPARINUNFRC  --   --  0.25*  --   --  0.20* 0.22*  ?CREATININE 1.01*  --   --   --   --  0.81  --   ?TROPONINIHS 18* 48*  --  39* 26*  --   --   ? ? ?Estimated Creatinine Clearance: 69.4 mL/min (by C-G formula based on SCr of 0.81 mg/dL). ? ? ? ?Assessment: ?66 yo female presented on 08/23/2021 with tachycardia found to have NSTEMI. No anticoagulation prior to admission. Pharmacy consulted for heparin.   ?  ?Heparin level remains subtherapeutic at 0.22 on 1200 units/hr. H/H, plt stable. No issues with infusion or bleeding per RN.  ? ? ?Goal of Therapy:  ?Heparin level 0.3-0.7 units/ml ?Monitor platelets by anticoagulation protocol: Yes ?  ?Plan:  ?Increase heparin infusion to 1400 units/hr ?Check heparin level in 6 hours and daily while on heparin ?Continue to monitor H&H and platelets ? ? ? ?Thank you for allowing pharmacy to be a part of this patient?s care. ? ?Ardyth Harps, PharmD ?Clinical Pharmacist ? ? ? ?

## 2021-08-24 NOTE — Progress Notes (Addendum)
ANTICOAGULATION CONSULT NOTE  ? ?Pharmacy Consult for Heparin ?Indication: chest pain/ACS ? ?Allergies  ?Allergen Reactions  ? Trazodone And Nefazodone Other (See Comments)  ?  Makes her feel jittery  ? ? ?Patient Measurements: ?Height: '5\' 3"'$  (160 cm) ?Weight: 80.1 kg (176 lb 9.6 oz) ?IBW/kg (Calculated) : 52.4 ?Heparin Dosing Weight: 70 kg ? ?Vital Signs: ?Temp: 97.3 ?F (36.3 ?C) (03/29 0335) ?Temp Source: Oral (03/29 0335) ?BP: 101/47 (03/29 0335) ?Pulse Rate: 69 (03/29 0012) ? ?Labs: ?Recent Labs  ?  08/23/21 ?9323 08/23/21 ?5573 08/23/21 ?1406 08/23/21 ?1812 08/23/21 ?2310 08/24/21 ?0424  ?HGB 13.0  --   --   --   --  12.0  ?HCT 38.3  --   --   --   --  36.2  ?PLT 368  --   --   --   --  319  ?HEPARINUNFRC  --   --  0.25*  --   --  0.20*  ?CREATININE 1.01*  --   --   --   --  0.81  ?TROPONINIHS 18* 48*  --  39* 26*  --   ? ? ?Estimated Creatinine Clearance: 69.4 mL/min (by C-G formula based on SCr of 0.81 mg/dL). ? ? ?Medical History: ?Past Medical History:  ?Diagnosis Date  ? Allergy   ? Bell's palsy   ? CAD (coronary artery disease)   ? Diabetes mellitus without complication ([place])   ? Gallstones   ? Heart disease   ? HLD (hyperlipidemia)   ? Hypertension   ? Mini stroke   ? TIA (transient ischemic attack) 06/30/2016  ? ? ?Assessment: ?66 yo female presented on 08/23/2021 with tachycardia found to have NSTEMI. No anticoagulation prior to admission. Pharmacy consulted for heparin.   ? ?Heparin level 0.2 is subtherapeutic on 1000 units/hr. HL decreased despite rate increase likely due to bolus effect. H/H, plt stable.  No issues with infusion or bleeding per RN. ? ? ?Goal of Therapy:  ?Heparin level 0.3-0.7 units/ml ?Monitor platelets by anticoagulation protocol: Yes ?  ?Plan:  ?Increase heparin gtt to 1200 units/hr ?F/u 6 hour heparin level ?Monitor daily heparin level, CBC ?Monitor for signs/symptoms of bleeding  ?F/u cards eval and recs ? ?Benetta Spar, PharmD, BCPS, BCCP ?Clinical Pharmacist ? ?Please check  AMION for all [place] phone numbers ?After 10:00 PM, call [place] [phone] ? ?

## 2021-08-24 NOTE — Consult Note (Addendum)
? ?The patient has been seen in conjunction with Vikki Ports, PA-C. All aspects of care have been considered and discussed. The patient has been personally interviewed, examined, and all clinical data has been reviewed. ? ?Months old history of exertional chest heaviness.  Suspect in-stent restenosis or occlusion of previously stented vessel in 2008. ?Tachycardia and palpitations, possibly related to atrial fibrillation/atrial flutters/SVT.  This is particularly concerning since she awakened from sleep with her heart racing. ?Elevated troponin in the setting of tachycardia with associated ischemic EKG changes are concerning for underlying native coronary disease. ?Recommend coronary angiography to define underlying anatomy.  Recommend long-term monitor to identify arrhythmia.  Further recommend consideration of sleep study as a cardiovascular risk factor given the history of snoring and potential apnea based upon husband's observation. ? ?Cardiology Consultation:  ? ?Patient ID: Breanna Obrien ?MRN: [id number]; DOB: 04/03/1956 ? ?Admit date: 08/23/2021 ?Date of Consult: 08/24/2021 ? ?PCP:  [name], MD ?  ?[facility] Providers ?Cardiologist:  None   (new) ? ?Patient Profile:  ? ?Breanna Obrien is a 66 y.o. female with a hx of CAD with stent x2 in [place], refractory HTN, IDDM, CVA who is being seen 08/24/2021 for the evaluation of chest pain at the request of Dr. Sarajane Jews. ? ?History of Present Illness:  ? ?Breanna Obrien is a 66 year old female with above medical history. Per chart review, patient had 2 stents placed in 2008 while she was living in [place]. She moved to [facility] in 2018 and has not set up cardiology care since. Patient had a CVA in 09/2016. As part of the workup, patient had an echocardiogram in 10/07/2016 that showed LVEF 60-65%, no RWMA. Patient has not been hospitalized since that admission.  ? ?Patient presented to the ED on 3/28 complaining of having an intermittently fast heart  rate and some chest discomfort for the past week. Labs in the ED showed Na 133, K 3.0, creatinine 1.01, WBC 9.8, hemoglobin 13.0, platelets 368. hsTn 18>>48>>39>>26.  ? ?CXR showed no active disease  ?EKG showed sinus tachycardia, rate 133, minimal ST depression (<1 box) in leads V4-V6.  ?Echocardiogram pending  ? ?On interview, patient reports that she has been having an intermittently elevated HR at home for the past week. Sometimes associated with low blood sugar. Episodes occurred at rest or when she was with activity. Associated with "feeling shaky and nervous." Typically not associated with chest pain, SOB, sweating. Episodes would last a few minutes and resolve on their own. However, on 3.27 at night the patient felt like her heart was beating fast. Found she had low blood sugar and drank some juice. Checked her HR and it was in the 150s. Started to feel chest pain that felt like pressure and was located in the center of her chest. Chest pain resolved by the time she arrived at the hospital, but episode lasted for about an hour. Also reported having some chest pressure when walking for the past few weeks, relieved with rest. Patient has a history of smoking, but quit over 10 years ago.  ? ?Past Medical History:  ?Diagnosis Date  ? Allergy   ? Bell's palsy   ? CAD (coronary artery disease)   ? Diabetes mellitus without complication ([place])   ? Gallstones   ? Heart disease   ? HLD (hyperlipidemia)   ? Hypertension   ? Mini stroke   ? TIA (transient ischemic attack) 06/30/2016  ? ? ?Past Surgical History:  ?Procedure Laterality Date  ?  CHOLECYSTECTOMY    ? CORONARY ANGIOPLASTY WITH STENT PLACEMENT    ? TONSILLECTOMY    ?  ? ?Home Medications:  ?Prior to Admission medications   ?Medication Sig Start Date End Date Taking? Authorizing Provider  ?ALPHA LIPOIC ACID PO Take 1 tablet by mouth daily. With green tea 08/27/17  Yes [provider]  ?amLODipine (NORVASC) 10 MG tablet TAKE 1 TABLET (10 MG TOTAL) BY  MOUTH DAILY. ?Patient taking differently: Take by mouth at bedtime. 09/28/20 09/28/21 Yes [name], MD  ?atenolol (TENORMIN) 100 MG tablet Take 1 tablet (100 mg total) by mouth daily. ?Patient taking differently: Take 100 mg by mouth at bedtime. 02/04/21  Yes [name], MD  ?atorvastatin (LIPITOR) 40 MG tablet Take 1 tablet (40 mg total) by mouth daily. ?Patient taking differently: Take 40 mg by mouth at bedtime. 02/04/21  Yes [name], MD  ?calcium-vitamin D (OSCAL WITH D) 500-200 MG-UNIT tablet Take 1 tablet by mouth.   Yes [provider]  ?CHROMIUM PO Take 1 tablet by mouth daily. 08/27/17  Yes [provider]  ?clopidogrel (PLAVIX) 75 MG tablet TAKE 1 TABLET (75 MG TOTAL) BY MOUTH DAILY. 02/04/21  Yes [name], MD  ?dapagliflozin propanediol (FARXIGA) 10 MG TABS tablet Take 1 tablet (10 mg total) by mouth daily. 10/05/20  Yes [name], MD  ?Fish Oil-Cholecalciferol (FISH OIL + D3 PO) Take 1 tablet by mouth daily. 08/27/17  Yes [provider]  ?glipiZIDE (GLUCOTROL) 10 MG tablet TAKE 1 TABLET BY MOUTH TWICE DAILY BEFORE A MEAL ?Patient taking differently: Take 10 mg by mouth 2 (two) times daily before a meal. 09/28/20 09/28/21 Yes [name], MD  ?hydrochlorothiazide (HYDRODIURIL) 25 MG tablet Take 1 tablet (25 mg total) by mouth daily. ?Patient taking differently: Take 25 mg by mouth at bedtime. 02/04/21  Yes [name], MD  ?insulin glargine (LANTUS) 100 UNIT/ML injection Inject 0.31 mLs (31 Units total) into the skin daily. ?Patient taking differently: Inject 31 Units into the skin at bedtime. 07/22/21  Yes [name], MD  ?losartan (COZAAR) 100 MG tablet TAKE 1 TABLET (100 MG TOTAL) BY MOUTH DAILY. ?Patient taking differently: Take 100 mg by mouth at bedtime. 09/28/20 09/28/21 Yes [name], MD  ?MAGNESIUM PO Take 1 tablet by mouth daily. 08/27/17  Yes [provider]  ?metFORMIN (GLUCOPHAGE) 1000 MG tablet TAKE 1  TABLET (1,000 MG TOTAL) BY MOUTH 2 (TWO) TIMES DAILY. ?Patient taking differently: Take 1,000 mg by mouth 2 (two) times daily with a meal. 09/28/20 09/28/21 Yes [name], MD  ?Multiple Vitamin (MULTIVITAMIN PO) Take 1 tablet by mouth daily. 08/27/17  Yes [provider]  ?Ascorbic Acid (VITAMIN C PO) Take 1 tablet by mouth daily. 08/27/17   [provider]  ?Insulin Syringe-Needle U-100 (INSULIN SYRINGE .5CC/30GX5/16") 30G X 5/16" 0.5 ML MISC use as directed to inject insulin 02/07/21   [name], MD  ? ? ?Inpatient Medications: ?Scheduled Meds: ? amLODipine  10 mg Oral QHS  ? aspirin EC  81 mg Oral Daily  ? atenolol  100 mg Oral QHS  ? atorvastatin  40 mg Oral QHS  ? clopidogrel  75 mg Oral Daily  ? hydrochlorothiazide  25 mg Oral QHS  ? insulin aspart  0-15 Units Subcutaneous TID WC  ? insulin aspart  0-5 Units Subcutaneous QHS  ? insulin glargine-yfgn  30 Units Subcutaneous QHS  ? losartan  100 mg Oral QHS  ? ?  Continuous Infusions: ? sodium chloride 10 mL/hr at 08/24/21 0828  ? heparin 1,200 Units/hr (08/24/21 0829)  ? ?PRN Meds: ?sodium chloride, acetaminophen, nitroGLYCERIN, ondansetron (ZOFRAN) IV ? ?Allergies:    ?Allergies  ?Allergen Reactions  ? Trazodone And Nefazodone Other (See Comments)  ?  Makes her feel jittery  ? ? ?Social History:   ?Social History  ? ?Socioeconomic History  ? Marital status: Married  ?  Spouse name: Not on file  ? Number of children: 1  ? Years of education: Not on file  ? Highest education level: Not on file  ?Occupational History  ? Occupation: retired  ?Tobacco Use  ? Smoking status: Former  ?  Types: Cigarettes  ?  Quit date: 32  ?  Years since quitting: 27.2  ? Smokeless tobacco: Never  ?Vaping Use  ? Vaping Use: Never used  ?Substance and Sexual Activity  ? Alcohol use: No  ? Drug use: Never  ? Sexual activity: Yes  ?  Birth control/protection: None, Post-menopausal  ?Other Topics Concern  ? Not on file  ?Social History Narrative  ? Not on file   ? ?Social Determinants of Health  ? ?Financial Resource Strain: Not on file  ?Food Insecurity: No Food Insecurity  ? Worried About Charity fundraiser in the Last Year: Never true  ? Ran Out of Food in the [employer]

## 2021-08-24 NOTE — Progress Notes (Signed)
?  Progress Note ? ? ?PatientNylani Obrien ZGY:[id number] DOB: 06-30-1955 DOA: 08/23/2021     1 ?DOS: the patient was seen and examined on 08/24/2021 ?  ?Brief hospital course: ?66 year old woman PMH CAD presented with palpitations, heart racing.  Admitted for further evaluation of the same as well as elevated troponins. ? ?Assessment and Plan: ?Chest pain ?-- With elevated troponins, palpitations concerning for paroxysmal sinus tachycardia by history. ?-- Appreciate cardiology evaluation, patient currently asymptomatic.  Plan for heart catheterization tomorrow.  TSH within normal limits. ? ?Type 2 diabetes mellitus with complication, without long-term current use of insulin (HCC) ?-- CBG stable.  Continue sliding scale insulin, Lantus. ? ?OSA (obstructive sleep apnea) ?--possible dx. Suggest outpatient sleep study ? ? ? ? ?  ? ?Subjective:  ?Feels fine now ?No CP/SOB now ? ?Physical Exam: ?Vitals:  ? 08/24/21 0335 08/24/21 0757 08/24/21 1156 08/24/21 1645  ?BP: (!) 101/47 (!) 125/57 126/63 (!) 113/58  ?Pulse:  63 66 66  ?Resp: '14 14 18 17  '$ ?Temp: (!) 97.3 ?F (36.3 ?C) 97.7 ?F (36.5 ?C) 97.8 ?F (36.6 ?C) 98.2 ?F (36.8 ?C)  ?TempSrc: Oral Oral Oral Oral  ?SpO2: 96% 100% 96% 97%  ?Weight: 80.1 kg     ?Height:      ? ?Physical Exam ?Vitals reviewed.  ?Constitutional:   ?   General: She is not in acute distress. ?   Appearance: She is not ill-appearing or toxic-appearing.  ?Cardiovascular:  ?   Rate and Rhythm: Normal rate and regular rhythm.  ?   Heart sounds: No murmur heard. ?Pulmonary:  ?   Effort: Pulmonary effort is normal. No respiratory distress.  ?   Breath sounds: No wheezing, rhonchi or rales.  ?Neurological:  ?   Mental Status: She is alert.  ?Psychiatric:     ?   Mood and Affect: Mood normal.     ?   Behavior: Behavior normal.  ? ? ?Data Reviewed: ? ?BMP unremarkable ?Mg 1.6 ?Troponins 48, 39, 26 ? ?Family Communication: none ? ?Disposition: ?Status is: Inpatient ?Remains inpatient appropriate because:  further evaluation of CP w/ cath ? ? Planned Discharge Destination: Home ? ? ? ?Time spent: 35 minutes ? ?Author: ?Murray Hodgkins, MD ?08/24/2021 6:48 PM ? ?For on call review [email or web address].  ?

## 2021-08-24 NOTE — H&P (View-Only) (Signed)
? ?The patient has been seen in conjunction with Vikki Ports, PA-C. All aspects of care have been considered and discussed. The patient has been personally interviewed, examined, and all clinical data has been reviewed. ? ?Months old history of exertional chest heaviness.  Suspect in-stent restenosis or occlusion of previously stented vessel in 2008. ?Tachycardia and palpitations, possibly related to atrial fibrillation/atrial flutters/SVT.  This is particularly concerning since she awakened from sleep with her heart racing. ?Elevated troponin in the setting of tachycardia with associated ischemic EKG changes are concerning for underlying native coronary disease. ?Recommend coronary angiography to define underlying anatomy.  Recommend long-term monitor to identify arrhythmia.  Further recommend consideration of sleep study as a cardiovascular risk factor given the history of snoring and potential apnea based upon husband's observation. ? ?Cardiology Consultation:  ? ?Patient ID: Breanna Obrien ?MRN: [id number]; DOB: [birth year] ? ?Admit date: 08/23/2021 ?Date of Consult: 08/24/2021 ? ?PCP:  [name], MD ?  ?[facility] Providers ?Cardiologist:  None   (new) ? ?Patient Profile:  ? ?Breanna Obrien is a 66 y.o. female with a hx of CAD with stent x2 in [place], refractory HTN, IDDM, CVA who is being seen 08/24/2021 for the evaluation of chest pain at the request of Dr. Sarajane Jews. ? ?History of Present Illness:  ? ?Breanna Obrien is a 66 year old female with above medical history. Per chart review, patient had 2 stents placed in 2008 while she was living in [place]. She moved to [facility] in 2018 and has not set up cardiology care since. Patient had a CVA in 09/2016. As part of the workup, patient had an echocardiogram in 10/07/2016 that showed LVEF 60-65%, no RWMA. Patient has not been hospitalized since that admission.  ? ?Patient presented to the ED on 3/28 complaining of having an intermittently fast heart  rate and some chest discomfort for the past week. Labs in the ED showed Na 133, K 3.0, creatinine 1.01, WBC 9.8, hemoglobin 13.0, platelets 368. hsTn 18>>48>>39>>26.  ? ?CXR showed no active disease  ?EKG showed sinus tachycardia, rate 133, minimal ST depression (<1 box) in leads V4-V6.  ?Echocardiogram pending  ? ?On interview, patient reports that she has been having an intermittently elevated HR at home for the past week. Sometimes associated with low blood sugar. Episodes occurred at rest or when she was with activity. Associated with "feeling shaky and nervous." Typically not associated with chest pain, SOB, sweating. Episodes would last a few minutes and resolve on their own. However, on 3.27 at night the patient felt like her heart was beating fast. Found she had low blood sugar and drank some juice. Checked her HR and it was in the 150s. Started to feel chest pain that felt like pressure and was located in the center of her chest. Chest pain resolved by the time she arrived at the hospital, but episode lasted for about an hour. Also reported having some chest pressure when walking for the past few weeks, relieved with rest. Patient has a history of smoking, but quit over 10 years ago.  ? ?Past Medical History:  ?Diagnosis Date  ? Allergy   ? Bell's palsy   ? CAD (coronary artery disease)   ? Diabetes mellitus without complication ([place])   ? Gallstones   ? Heart disease   ? HLD (hyperlipidemia)   ? Hypertension   ? Mini stroke   ? TIA (transient ischemic attack) 06/30/2016  ? ? ?Past Surgical History:  ?Procedure Laterality Date  ?  CHOLECYSTECTOMY    ? CORONARY ANGIOPLASTY WITH STENT PLACEMENT    ? TONSILLECTOMY    ?  ? ?Home Medications:  ?Prior to Admission medications   ?Medication Sig Start Date End Date Taking? Authorizing Provider  ?ALPHA LIPOIC ACID PO Take 1 tablet by mouth daily. With green tea 08/27/17  Yes [provider]  ?amLODipine (NORVASC) 10 MG tablet TAKE 1 TABLET (10 MG TOTAL) BY  MOUTH DAILY. ?Patient taking differently: Take by mouth at bedtime. 09/28/20 09/28/21 Yes [name], MD  ?atenolol (TENORMIN) 100 MG tablet Take 1 tablet (100 mg total) by mouth daily. ?Patient taking differently: Take 100 mg by mouth at bedtime. 02/04/21  Yes [name], MD  ?atorvastatin (LIPITOR) 40 MG tablet Take 1 tablet (40 mg total) by mouth daily. ?Patient taking differently: Take 40 mg by mouth at bedtime. 02/04/21  Yes [name], MD  ?calcium-vitamin D (OSCAL WITH D) 500-200 MG-UNIT tablet Take 1 tablet by mouth.   Yes [provider]  ?CHROMIUM PO Take 1 tablet by mouth daily. 08/27/17  Yes [provider]  ?clopidogrel (PLAVIX) 75 MG tablet TAKE 1 TABLET (75 MG TOTAL) BY MOUTH DAILY. 02/04/21  Yes [name], MD  ?dapagliflozin propanediol (FARXIGA) 10 MG TABS tablet Take 1 tablet (10 mg total) by mouth daily. 10/05/20  Yes [name], MD  ?Fish Oil-Cholecalciferol (FISH OIL + D3 PO) Take 1 tablet by mouth daily. 08/27/17  Yes [provider]  ?glipiZIDE (GLUCOTROL) 10 MG tablet TAKE 1 TABLET BY MOUTH TWICE DAILY BEFORE A MEAL ?Patient taking differently: Take 10 mg by mouth 2 (two) times daily before a meal. 09/28/20 09/28/21 Yes [name], MD  ?hydrochlorothiazide (HYDRODIURIL) 25 MG tablet Take 1 tablet (25 mg total) by mouth daily. ?Patient taking differently: Take 25 mg by mouth at bedtime. 02/04/21  Yes [name], MD  ?insulin glargine (LANTUS) 100 UNIT/ML injection Inject 0.31 mLs (31 Units total) into the skin daily. ?Patient taking differently: Inject 31 Units into the skin at bedtime. 07/22/21  Yes [name], MD  ?losartan (COZAAR) 100 MG tablet TAKE 1 TABLET (100 MG TOTAL) BY MOUTH DAILY. ?Patient taking differently: Take 100 mg by mouth at bedtime. 09/28/20 09/28/21 Yes [name], MD  ?MAGNESIUM PO Take 1 tablet by mouth daily. 08/27/17  Yes [provider]  ?metFORMIN (GLUCOPHAGE) 1000 MG tablet TAKE 1  TABLET (1,000 MG TOTAL) BY MOUTH 2 (TWO) TIMES DAILY. ?Patient taking differently: Take 1,000 mg by mouth 2 (two) times daily with a meal. 09/28/20 09/28/21 Yes [name], MD  ?Multiple Vitamin (MULTIVITAMIN PO) Take 1 tablet by mouth daily. 08/27/17  Yes [provider]  ?Ascorbic Acid (VITAMIN C PO) Take 1 tablet by mouth daily. 08/27/17   [provider]  ?Insulin Syringe-Needle U-100 (INSULIN SYRINGE .5CC/30GX5/16") 30G X 5/16" 0.5 ML MISC use as directed to inject insulin 02/07/21   [name], MD  ? ? ?Inpatient Medications: ?Scheduled Meds: ? amLODipine  10 mg Oral QHS  ? aspirin EC  81 mg Oral Daily  ? atenolol  100 mg Oral QHS  ? atorvastatin  40 mg Oral QHS  ? clopidogrel  75 mg Oral Daily  ? hydrochlorothiazide  25 mg Oral QHS  ? insulin aspart  0-15 Units Subcutaneous TID WC  ? insulin aspart  0-5 Units Subcutaneous QHS  ? insulin glargine-yfgn  30 Units Subcutaneous QHS  ? losartan  100 mg Oral QHS  ? ?  Continuous Infusions: ? sodium chloride 10 mL/hr at 08/24/21 0828  ? heparin 1,200 Units/hr (08/24/21 0829)  ? ?PRN Meds: ?sodium chloride, acetaminophen, nitroGLYCERIN, ondansetron (ZOFRAN) IV ? ?Allergies:    ?Allergies  ?Allergen Reactions  ? Trazodone And Nefazodone Other (See Comments)  ?  Makes her feel jittery  ? ? ?Social History:   ?Social History  ? ?Socioeconomic History  ? Marital status: Married  ?  Spouse name: Not on file  ? Number of children: 1  ? Years of education: Not on file  ? Highest education level: Not on file  ?Occupational History  ? Occupation: retired  ?Tobacco Use  ? Smoking status: Former  ?  Types: Cigarettes  ?  Quit date: 53  ?  Years since quitting: 27.2  ? Smokeless tobacco: Never  ?Vaping Use  ? Vaping Use: Never used  ?Substance and Sexual Activity  ? Alcohol use: No  ? Drug use: Never  ? Sexual activity: Yes  ?  Birth control/protection: None, Post-menopausal  ?Other Topics Concern  ? Not on file  ?Social History Narrative  ? Not on file   ? ?Social Determinants of Health  ? ?Financial Resource Strain: Not on file  ?Food Insecurity: No Food Insecurity  ? Worried About Charity fundraiser in the Last Year: Never true  ? Ran Out of Food in the [employer]

## 2021-08-25 ENCOUNTER — Encounter (HOSPITAL_COMMUNITY)
Admission: EM | Disposition: A | Discharge status: Home / Self Care | Payer: Self-pay | Source: Home / Self Care | Attending: Family Medicine

## 2021-08-25 ENCOUNTER — Other Ambulatory Visit (HOSPITAL_COMMUNITY): Payer: Self-pay

## 2021-08-25 ENCOUNTER — Inpatient Hospital Stay (HOSPITAL_COMMUNITY)
Admission: RE | Admit: 2021-08-25 | Payer: Medicare PPO | Source: Home / Self Care | Admitting: Interventional Cardiology

## 2021-08-25 ENCOUNTER — Other Ambulatory Visit: Payer: Self-pay

## 2021-08-25 DIAGNOSIS — R778 Other specified abnormalities of plasma proteins: Secondary | ICD-10-CM | POA: Diagnosis not present

## 2021-08-25 DIAGNOSIS — I2510 Atherosclerotic heart disease of native coronary artery without angina pectoris: Secondary | ICD-10-CM | POA: Diagnosis not present

## 2021-08-25 DIAGNOSIS — R079 Chest pain, unspecified: Secondary | ICD-10-CM | POA: Diagnosis not present

## 2021-08-25 DIAGNOSIS — Z9861 Coronary angioplasty status: Secondary | ICD-10-CM

## 2021-08-25 DIAGNOSIS — E118 Type 2 diabetes mellitus with unspecified complications: Secondary | ICD-10-CM | POA: Diagnosis not present

## 2021-08-25 HISTORY — PX: INTRAVASCULAR ULTRASOUND/IVUS: CATH118244

## 2021-08-25 HISTORY — PX: CORONARY STENT INTERVENTION: CATH118234

## 2021-08-25 HISTORY — PX: LEFT HEART CATH AND CORONARY ANGIOGRAPHY: CATH118249

## 2021-08-25 LAB — POCT ACTIVATED CLOTTING TIME
Activated Clotting Time: 341 seconds
Activated Clotting Time: 414 seconds

## 2021-08-25 LAB — BASIC METABOLIC PANEL
Anion gap: 8 (ref 5–15)
BUN: 15 mg/dL (ref 8–23)
CO2: 25 mmol/L (ref 22–32)
Calcium: 9.4 mg/dL (ref 8.9–10.3)
Chloride: 102 mmol/L (ref 98–111)
Creatinine, Ser: 0.92 mg/dL (ref 0.44–1.00)
GFR, Estimated: >60 mL/min (ref >60)
Glucose, Bld: 121 mg/dL — ABNORMAL HIGH (ref 70–99)
Potassium: 3.7 mmol/L (ref 3.5–5.1)
Sodium: 135 mmol/L (ref 135–145)

## 2021-08-25 LAB — GLUCOSE, CAPILLARY
Glucose-Capillary: 128 mg/dL — ABNORMAL HIGH (ref 70–99)
Glucose-Capillary: 142 mg/dL — ABNORMAL HIGH (ref 70–99)
Glucose-Capillary: 231 mg/dL — ABNORMAL HIGH (ref 70–99)
Glucose-Capillary: 99 mg/dL (ref 70–99)

## 2021-08-25 LAB — CBC
HCT: 36.9 % (ref 36.0–46.0)
Hemoglobin: 12.3 g/dL (ref 12.0–15.0)
MCH: 30 pg (ref 26.0–34.0)
MCHC: 33.3 g/dL (ref 30.0–36.0)
MCV: 90 fL (ref 80.0–100.0)
Platelets: 283 10*3/uL (ref 150–400)
RBC: 4.1 MIL/uL (ref 3.87–5.11)
RDW: 12.9 % (ref 11.5–15.5)
WBC: 7 10*3/uL (ref 4.0–10.5)
nRBC: 0 % (ref 0.0–0.2)

## 2021-08-25 LAB — HEPARIN LEVEL (UNFRACTIONATED)
Heparin Unfractionated: 0.37 IU/mL (ref 0.30–0.70)
Heparin Unfractionated: 0.58 IU/mL (ref 0.30–0.70)

## 2021-08-25 LAB — T3: T3, Total: 133 ng/dL (ref 71–180)

## 2021-08-25 SURGERY — LEFT HEART CATH AND CORONARY ANGIOGRAPHY
Anesthesia: LOCAL

## 2021-08-25 MED ORDER — LABETALOL HCL 5 MG/ML IV SOLN: 10.0000 mg | INTRAVENOUS | Status: AC | PRN

## 2021-08-25 MED ORDER — VERAPAMIL HCL 2.5 MG/ML IV SOLN
INTRAVENOUS | Status: AC
  Filled 2021-08-25: qty 2

## 2021-08-25 MED ORDER — SODIUM CHLORIDE 0.9 % IV SOLN: INTRAVENOUS | Status: AC

## 2021-08-25 MED ORDER — ASPIRIN 81 MG PO CHEW
81.0000 mg | CHEWABLE_TABLET | Freq: Every day | ORAL | Status: DC
  Administered 2021-08-26: 81 mg via ORAL
  Filled 2021-08-25 (×2): qty 1

## 2021-08-25 MED ORDER — ACETAMINOPHEN 325 MG PO TABS: 650.0000 mg | ORAL_TABLET | ORAL | Status: DC | PRN

## 2021-08-25 MED ORDER — HEPARIN SODIUM (PORCINE) 1000 UNIT/ML IJ SOLN
INTRAMUSCULAR | Status: AC
  Filled 2021-08-25: qty 10

## 2021-08-25 MED ORDER — MIDAZOLAM HCL 2 MG/2ML IJ SOLN
INTRAMUSCULAR | Status: AC
  Filled 2021-08-25: qty 2

## 2021-08-25 MED ORDER — SODIUM CHLORIDE 0.9% FLUSH
3.0000 mL | Freq: Two times a day (BID) | INTRAVENOUS | Status: DC
  Administered 2021-08-25: 3 mL via INTRAVENOUS

## 2021-08-25 MED ORDER — HEPARIN (PORCINE) IN NACL 1000-0.9 UT/500ML-% IV SOLN
INTRAVENOUS | Status: DC | PRN
  Administered 2021-08-25 (×2): 500 mL

## 2021-08-25 MED ORDER — SODIUM CHLORIDE 0.9 % IV SOLN: 250.0000 mL | INTRAVENOUS | Status: DC | PRN

## 2021-08-25 MED ORDER — HYDRALAZINE HCL 20 MG/ML IJ SOLN: 10.0000 mg | INTRAMUSCULAR | Status: AC | PRN

## 2021-08-25 MED ORDER — VERAPAMIL HCL 2.5 MG/ML IV SOLN
INTRAVENOUS | Status: DC | PRN
  Administered 2021-08-25: 2 mg via INTRAVENOUS

## 2021-08-25 MED ORDER — SODIUM CHLORIDE 0.9% FLUSH: 3.0000 mL | INTRAVENOUS | Status: DC | PRN

## 2021-08-25 MED ORDER — FENTANYL CITRATE (PF) 100 MCG/2ML IJ SOLN
INTRAMUSCULAR | Status: DC | PRN
  Administered 2021-08-25 (×5): 25 ug via INTRAVENOUS

## 2021-08-25 MED ORDER — HEPARIN (PORCINE) IN NACL 1000-0.9 UT/500ML-% IV SOLN
INTRAVENOUS | Status: AC
  Filled 2021-08-25: qty 1000

## 2021-08-25 MED ORDER — CLOPIDOGREL BISULFATE 300 MG PO TABS
ORAL_TABLET | ORAL | Status: DC | PRN
  Administered 2021-08-25: 300 mg via ORAL

## 2021-08-25 MED ORDER — ONDANSETRON HCL 4 MG/2ML IJ SOLN: 4.0000 mg | Freq: Four times a day (QID) | INTRAMUSCULAR | Status: DC | PRN

## 2021-08-25 MED ORDER — FENTANYL CITRATE (PF) 100 MCG/2ML IJ SOLN
INTRAMUSCULAR | Status: AC
  Filled 2021-08-25: qty 2

## 2021-08-25 MED ORDER — HEPARIN SODIUM (PORCINE) 1000 UNIT/ML IJ SOLN
INTRAMUSCULAR | Status: DC | PRN
  Administered 2021-08-25: 4000 [IU] via INTRAVENOUS
  Administered 2021-08-25: 6000 [IU] via INTRAVENOUS

## 2021-08-25 MED ORDER — CLOPIDOGREL BISULFATE 75 MG PO TABS
75.0000 mg | ORAL_TABLET | Freq: Every day | ORAL | Status: DC
  Administered 2021-08-26: 75 mg via ORAL
  Filled 2021-08-25: qty 1

## 2021-08-25 MED ORDER — LIDOCAINE HCL (PF) 1 % IJ SOLN
INTRAMUSCULAR | Status: AC
  Filled 2021-08-25: qty 30

## 2021-08-25 MED ORDER — MIDAZOLAM HCL 2 MG/2ML IJ SOLN
INTRAMUSCULAR | Status: DC | PRN
  Administered 2021-08-25: 2 mg via INTRAVENOUS
  Administered 2021-08-25 (×4): 1 mg via INTRAVENOUS

## 2021-08-25 MED ORDER — IOHEXOL 350 MG/ML SOLN
INTRAVENOUS | Status: DC | PRN
  Administered 2021-08-25: 100 mL

## 2021-08-25 MED ORDER — CLOPIDOGREL BISULFATE 300 MG PO TABS
ORAL_TABLET | ORAL | Status: AC
Start: 2021-08-25 — End: ?
  Filled 2021-08-25: qty 1

## 2021-08-25 MED ORDER — VERAPAMIL HCL 2.5 MG/ML IV SOLN
INTRAVENOUS | Status: DC | PRN
  Administered 2021-08-25: 10 mL via INTRA_ARTERIAL

## 2021-08-25 MED ORDER — NITROGLYCERIN 1 MG/10 ML FOR IR/CATH LAB
INTRA_ARTERIAL | Status: AC
  Filled 2021-08-25: qty 10

## 2021-08-25 MED ORDER — LIDOCAINE HCL (PF) 1 % IJ SOLN
INTRAMUSCULAR | Status: DC | PRN
  Administered 2021-08-25: 2 mL

## 2021-08-25 MED ORDER — NITROGLYCERIN 1 MG/10 ML FOR IR/CATH LAB
INTRA_ARTERIAL | Status: DC | PRN
  Administered 2021-08-25 (×4): 200 ug via INTRACORONARY
  Administered 2021-08-25: 300 ug via INTRA_ARTERIAL

## 2021-08-25 SURGICAL SUPPLY — 22 items
BALLN SCOREFLEX 2.50X10 (BALLOONS) ×2
BALLN ~~LOC~~ EUPHORA RX 3.25X15 (BALLOONS) ×2
BALLOON SCOREFLEX 2.50X10 (BALLOONS) IMPLANT
BALLOON ~~LOC~~ EUPHORA RX 3.25X15 (BALLOONS) IMPLANT
BAND ZEPHYR COMPRESS 30 LONG (HEMOSTASIS) ×1 IMPLANT
CATH 5FR JL3.5 JR4 ANG PIG MP (CATHETERS) ×1 IMPLANT
CATH LAUNCHER 6FR JR4 (CATHETERS) ×1 IMPLANT
CATH OPTICROSS HD (CATHETERS) ×1 IMPLANT
GLIDESHEATH SLEND SS 6F .021 (SHEATH) ×1 IMPLANT
GUIDEWIRE INQWIRE 1.5J.035X260 (WIRE) IMPLANT
INQWIRE 1.5J .035X260CM (WIRE) ×2
KIT ENCORE 26 ADVANTAGE (KITS) ×1 IMPLANT
KIT HEART LEFT (KITS) ×2 IMPLANT
KIT HEMO VALVE WATCHDOG (MISCELLANEOUS) ×1 IMPLANT
PACK CARDIAC CATHETERIZATION (CUSTOM PROCEDURE TRAY) ×2 IMPLANT
SLED PULL BACK IVUS (MISCELLANEOUS) ×1 IMPLANT
STENT ONYX FRONTIER 3.0X12 (Permanent Stent) ×2 IMPLANT
STENT ONYX FRONTIER 3.0X18 (Permanent Stent) ×1 IMPLANT
TRANSDUCER W/STOPCOCK (MISCELLANEOUS) ×2 IMPLANT
TUBING CIL FLEX 10 FLL-RA (TUBING) ×2 IMPLANT
WIRE ASAHI PROWATER 180CM (WIRE) ×1 IMPLANT
WIRE HI TORQ VERSACORE-J 145CM (WIRE) ×1 IMPLANT

## 2021-08-25 NOTE — Assessment & Plan Note (Signed)
--  continue ASA and clopidogrel  ?

## 2021-08-25 NOTE — Progress Notes (Signed)
ANTICOAGULATION CONSULT NOTE ? ?Pharmacy Consult for Heparin ?Indication: chest pain/ACS ? ?Allergies  ?Allergen Reactions  ? Trazodone And Nefazodone Other (See Comments)  ?  Makes her feel jittery  ? ? ?Patient Measurements: ?Height: '5\' 3"'$  (160 cm) ?Weight: 80.1 kg (176 lb 9.6 oz) ?IBW/kg (Calculated) : 52.4 ? ?Heparin Dosing Weight: 70 kg ? ?Vital Signs: ?Temp: 98 ?F (36.7 ?C) (03/29 2359) ?Temp Source: Oral (03/29 2359) ?BP: 133/70 (03/29 2359) ?Pulse Rate: 76 (03/29 2029) ? ?Labs: ?Recent Labs  ?  08/23/21 ?9622 08/23/21 ?2979 08/23/21 ?1406 08/23/21 ?1812 08/23/21 ?2310 08/24/21 ?0424 08/24/21 ?1437 08/24/21 ?2351  ?HGB 13.0  --   --   --   --  12.0  --   --   ?HCT 38.3  --   --   --   --  36.2  --   --   ?PLT 368  --   --   --   --  319  --   --   ?HEPARINUNFRC  --   --    < >  --   --  0.20* 0.22* 0.37  ?CREATININE 1.01*  --   --   --   --  0.81  --   --   ?TROPONINIHS 18* 48*  --  39* 26*  --   --   --   ? < > = values in this interval not displayed.  ? ? ? ?Estimated Creatinine Clearance: 69.4 mL/min (by C-G formula based on SCr of 0.81 mg/dL). ? ? ? ?Assessment: ?66 yo female presented on 08/23/2021 with tachycardia found to have NSTEMI. No anticoagulation prior to admission. Pharmacy consulted for heparin.   ?  ?Heparin level therapeutic: 0.37 after rate increase on 1400 units/hr. H/H, plt stable. No issues with infusion or bleeding per RN.  ? ? ?Goal of Therapy:  ?Heparin level 0.3-0.7 units/ml ?Monitor platelets by anticoagulation protocol: Yes ?  ?Plan:  ?Continue heparin infusion at 1400 units/hr ?Check confirmatory heparin level in 6 hours and daily while on heparin ?Continue to monitor H&H and platelets ? ? ? ?Thank you for allowing pharmacy to be a part of this patient?s care. ? ?Georga Bora, PharmD ?Clinical Pharmacist ?08/25/2021 1:18 AM ?Please check AMION for all [place] numbers  ? ? ? ?

## 2021-08-25 NOTE — TOC Benefit Eligibility Note (Signed)
Patient Advocate Encounter  Insurance verification completed.    The patient is currently admitted and upon discharge could be taking Brilinta 90 mg.  The current 30 day co-pay is, $40.00.   The patient is insured through [employer] Part D     Idolina Mantell, CPhT Pharmacy Patient Advocate Specialist [facility] Patient Advocate Team Direct Number: [phone]  Fax: [phone]        

## 2021-08-25 NOTE — Progress Notes (Addendum)
LOCATION: right RADIAL ? ?DEFLATED PER PROTOCOL: YES ? ?TIME BAND OFF/DRESSING APPLIED: 1535, gauze with tegaderm placed ? ?SITE UPON ARRIVAL: LEVEL 0 ? ?SITE AFTER BAND REMOVAL: LEVEL 0 ? ?CIRCULATION SENSATION AND MOVEMENT: +2 radial pulse, + movement ? ?COMMENTS: right wrist cleaned, old, dried reddish drainage removed, then gauze with tegaderm placed, bruising noted around insertion site, see flowsheet, minimal manual pressure applied to right wrist for approximately 3-4 minutes, area soft, but puffiness noted, appears to be 2 stick sites ? ?

## 2021-08-25 NOTE — Interval H&P Note (Signed)
Cath Lab Visit (complete for each Cath Lab visit) ? ?Clinical Evaluation Leading to the Procedure:  ? ?ACS: Yes.   ? ?Non-ACS:   ? ?Anginal Classification: CCS IV ? ?Anti-ischemic medical therapy: Minimal Therapy (1 class of medications) ? ?Non-Invasive Test Results: No non-invasive testing performed ? ?Prior CABG: No previous CABG ? ? ? ? ? ?History and Physical Interval Note: ? ?08/25/2021 ?8:44 AM ? ?Breanna Obrien  has presented today for surgery, with the diagnosis of cad.  The various methods of treatment have been discussed with the patient and family. After consideration of risks, benefits and other options for treatment, the patient has consented to  Procedure(s): ?LEFT HEART CATH AND CORONARY ANGIOGRAPHY (N/A) as a surgical intervention.  The patient's history has been reviewed, patient examined, no change in status, stable for surgery.  I have reviewed the patient's chart and labs.  Questions were answered to the patient's satisfaction.   ? ? ?Larae Grooms ? ? ?

## 2021-08-25 NOTE — Progress Notes (Signed)
?  Progress Note ? ? ?PatientTherasa Obrien XTG:[id number] DOB: 05-02-1956 DOA: 08/23/2021     2 ?DOS: the patient was seen and examined on 08/25/2021 ?  ?Brief hospital course: ?66 year old woman PMH CAD presented with palpitations, heart racing.  Admitted for further evaluation of the same as well as elevated troponins. S/p LHC w/ stent and angioplasty 3/30. Recommend uninterrupted dual antiplatelet therapy with Aspirin '81mg'$  daily and Clopidogrel '75mg'$  daily for a minimum of 12 months. ? ?Assessment and Plan: ?* Chest pain ?-- With elevated troponins, palpitations concerning for paroxysmal sinus tachycardia by history. ?-- s/p LHC w/ balloon and stent ? ?Elevated troponin ?--as above ? ?CAD S/P percutaneous coronary angioplasty ?--continue ASA and clopidogrel  ? ?Type 2 diabetes mellitus with complication, without long-term current use of insulin (HCC) ?-- CBG stable.  Continue sliding scale insulin, Lantus. ? ?OSA (obstructive sleep apnea) ?--possible dx. Suggest outpatient sleep study ? ? ? ? ?  ? ?Subjective:  ?Feels ok ? ?Physical Exam: ?Vitals:  ? 08/25/21 1515 08/25/21 1525 08/25/21 1530 08/25/21 1603  ?BP: (!) 96/39 (!) 123/59 (!) 119/43 129/71  ?Pulse: 72 79 76 68  ?Resp: '13 15 13 15  '$ ?Temp:    98 ?F (36.7 ?C)  ?TempSrc:    Oral  ?SpO2: 95% 98% 98% 100%  ?Weight:      ?Height:      ? ?Physical Exam ?Vitals reviewed.  ?Constitutional:   ?   General: She is not in acute distress. ?   Appearance: She is not ill-appearing or toxic-appearing.  ?Cardiovascular:  ?   Rate and Rhythm: Normal rate and regular rhythm.  ?   Heart sounds: No murmur heard. ?Pulmonary:  ?   Effort: Pulmonary effort is normal. No respiratory distress.  ?   Breath sounds: No wheezing, rhonchi or rales.  ?Neurological:  ?   Mental Status: She is alert.  ?Psychiatric:     ?   Mood and Affect: Mood normal.     ?   Behavior: Behavior normal.  ? ? ?Data Reviewed: ? ?CBG stable ?BMP stable ? ?Family Communication: none ? ?Disposition: ?Status is:  Inpatient ?Remains inpatient appropriate because: s/p LHC with intervention ? Planned Discharge Destination: Home ? ? ? ?Time spent: 20 minutes ? ?Author: ?Murray Hodgkins, MD ?08/25/2021 6:40 PM ? ?For on call review [email or web address].  ?

## 2021-08-25 NOTE — Progress Notes (Signed)
Transport at bedside to take patient to cath lab. Heparin stopped.  ?

## 2021-08-25 NOTE — Progress Notes (Addendum)
Pt's husband called into holding area, updated on pt status, will call by phone when pt is assigned room ?

## 2021-08-25 NOTE — Progress Notes (Signed)
Report and care received from [facility] Patient resting at this time with no complaints. Will continue to monitor patient. Rt Radial soft with no active bleeding or hematoma noted. ?

## 2021-08-25 NOTE — Assessment & Plan Note (Deleted)
-   as above

## 2021-08-26 ENCOUNTER — Telehealth: Payer: Self-pay | Admitting: Physician Assistant

## 2021-08-26 ENCOUNTER — Other Ambulatory Visit (HOSPITAL_COMMUNITY): Payer: Self-pay

## 2021-08-26 ENCOUNTER — Other Ambulatory Visit: Payer: Self-pay | Admitting: Physician Assistant

## 2021-08-26 ENCOUNTER — Encounter (HOSPITAL_COMMUNITY): Payer: Self-pay | Admitting: Interventional Cardiology

## 2021-08-26 ENCOUNTER — Inpatient Hospital Stay (INDEPENDENT_AMBULATORY_CARE_PROVIDER_SITE_OTHER): Payer: Medicare PPO

## 2021-08-26 DIAGNOSIS — G4733 Obstructive sleep apnea (adult) (pediatric): Secondary | ICD-10-CM

## 2021-08-26 DIAGNOSIS — R002 Palpitations: Secondary | ICD-10-CM

## 2021-08-26 DIAGNOSIS — Z9861 Coronary angioplasty status: Secondary | ICD-10-CM | POA: Diagnosis not present

## 2021-08-26 DIAGNOSIS — I2510 Atherosclerotic heart disease of native coronary artery without angina pectoris: Secondary | ICD-10-CM | POA: Diagnosis not present

## 2021-08-26 DIAGNOSIS — I214 Non-ST elevation (NSTEMI) myocardial infarction: Secondary | ICD-10-CM | POA: Diagnosis not present

## 2021-08-26 DIAGNOSIS — E118 Type 2 diabetes mellitus with unspecified complications: Secondary | ICD-10-CM | POA: Diagnosis not present

## 2021-08-26 LAB — CBC
HCT: 35.7 % — ABNORMAL LOW (ref 36.0–46.0)
Hemoglobin: 12.2 g/dL (ref 12.0–15.0)
MCH: 30.3 pg (ref 26.0–34.0)
MCHC: 34.2 g/dL (ref 30.0–36.0)
MCV: 88.8 fL (ref 80.0–100.0)
Platelets: 307 10*3/uL (ref 150–400)
RBC: 4.02 MIL/uL (ref 3.87–5.11)
RDW: 12.8 % (ref 11.5–15.5)
WBC: 6.8 10*3/uL (ref 4.0–10.5)
nRBC: 0 % (ref 0.0–0.2)

## 2021-08-26 LAB — BASIC METABOLIC PANEL
Anion gap: 8 (ref 5–15)
BUN: 17 mg/dL (ref 8–23)
CO2: 26 mmol/L (ref 22–32)
Calcium: 9.2 mg/dL (ref 8.9–10.3)
Chloride: 100 mmol/L (ref 98–111)
Creatinine, Ser: 1.11 mg/dL — ABNORMAL HIGH (ref 0.44–1.00)
GFR, Estimated: 55 mL/min — ABNORMAL LOW (ref >60)
Glucose, Bld: 125 mg/dL — ABNORMAL HIGH (ref 70–99)
Potassium: 3.7 mmol/L (ref 3.5–5.1)
Sodium: 134 mmol/L — ABNORMAL LOW (ref 135–145)

## 2021-08-26 LAB — GLUCOSE, CAPILLARY: Glucose-Capillary: 165 mg/dL — ABNORMAL HIGH (ref 70–99)

## 2021-08-26 MED ORDER — ASPIRIN 81 MG PO CHEW
81.0000 mg | CHEWABLE_TABLET | Freq: Every day | ORAL | Status: AC
Start: 2021-08-27 — End: ?

## 2021-08-26 MED ORDER — TICAGRELOR 90 MG PO TABS
180.0000 mg | ORAL_TABLET | Freq: Once | ORAL | Status: DC
Start: 2021-08-26 — End: 2021-08-26

## 2021-08-26 MED ORDER — TICAGRELOR 90 MG PO TABS
90.0000 mg | ORAL_TABLET | Freq: Two times a day (BID) | ORAL | 1 refills | Status: DC
Start: 2021-08-26 — End: 2021-09-06
  Filled 2021-08-26: qty 60, 30d supply, fill #0

## 2021-08-26 MED ORDER — TICAGRELOR 90 MG PO TABS
180.0000 mg | ORAL_TABLET | Freq: Once | ORAL | Status: AC
  Administered 2021-08-26: 180 mg via ORAL
  Filled 2021-08-26: qty 2

## 2021-08-26 MED ORDER — TICAGRELOR 90 MG PO TABS: 90.0000 mg | ORAL_TABLET | Freq: Two times a day (BID) | ORAL | Status: DC

## 2021-08-26 MED ORDER — NITROGLYCERIN 0.4 MG SL SUBL
0.4000 mg | SUBLINGUAL_TABLET | SUBLINGUAL | 1 refills | Status: AC | PRN
Start: 2021-08-26 — End: ?
  Filled 2021-08-26: qty 25, 14d supply, fill #0

## 2021-08-26 NOTE — Assessment & Plan Note (Signed)
--  Doing well s/p LHC w/ balloon and stent ?--Given palpitations, long-term monitor planned.  This will be arranged by cardiology. ?--Continue aspirin and Brilinta.  Cleared for discharge per cardiology. ?

## 2021-08-26 NOTE — Progress Notes (Addendum)
F/u arranged, pt aware of [facility] location. Also per d/w Dr. Tamala Julian he recommends non-live monitor so he recommends 14 day Zio instead. Msg sent to office to mail to pt's house. Spoke with pt so she is aware. Also outlined on AVS, confirmed with nurse not printed yet before we added this. Monitor will not be resulted by time of appt but needs TOC given dx of NSTEMI. ?

## 2021-08-26 NOTE — Care Management Important Message (Signed)
Important Message ? ?Patient Details  ?Name: Breanna Obrien ?MRN: [id number] ?Date of Birth: 11-29-1955 ? ? ?Medicare Important Message Given:  Yes ? ? ? ? ?Shelda Altes ?08/26/2021, 9:15 AM ?

## 2021-08-26 NOTE — TOC Benefit Eligibility Note (Signed)
Patient Advocate Encounter  Insurance verification completed.    The patient is currently admitted and upon discharge could be taking Brilinta 90 mg.  The current 30 day co-pay is, $40.00.   The patient is insured through [employer] Part D     Edee Nifong, CPhT Pharmacy Patient Advocate Specialist [facility] Patient Advocate Team Direct Number: [phone]  Fax: [phone]        

## 2021-08-26 NOTE — Care Management Important Message (Deleted)
Important Message ? ?Patient Details  ?Name: Breanna Obrien ?MRN: [id number] ?Date of Birth: 09/28/1955 ? ? ?Medicare Important Message Given:  Yes ? ? ? ? ?Shelda Altes ?08/26/2021, 9:23 AM ?

## 2021-08-26 NOTE — Discharge Instructions (Signed)

## 2021-08-26 NOTE — Progress Notes (Signed)
CARDIAC REHAB PHASE I  ? ?Offered to walk with pt. Pt states independent ambulation without difficulty. Pt educated on importance of ASA and Plavix. Pt given heart healthy and diabetic diets. Reviewed site care, restrictions, and exercise guidelines. Will refer to [facility] [place]. ? ?[phone] ?Rufina Falco, RN BSN ?08/26/2021 ?8:56 AM ? ?

## 2021-08-26 NOTE — Discharge Summary (Signed)
?Physician Discharge Summary ?  ?Patient: Breanna Obrien MRN: [id number] DOB: 01-19-1956  ?Admit date:     08/23/2021  ?Discharge date: 08/26/21  ?Discharge Physician: Murray Hodgkins  ? ?PCP: [name], MD  ? ?Recommendations at discharge:  ? ? ?*NSTEMI, CAD ?--Doing well s/p LHC w/ balloon and stent ?--Given palpitations, long-term monitor planned.  This will be arranged by cardiology. ? ?OSA (obstructive sleep apnea) ?--possible dx. Suggest outpatient sleep study ? ?Discharge Diagnoses: ?Principal Problem: ?  Chest pain ?Active Problems: ?  CAD S/P percutaneous coronary angioplasty ?  Elevated troponin ?  Palpitations ?  Type 2 diabetes mellitus with complication, without long-term current use of insulin ([place]) ?  OSA (obstructive sleep apnea) ? ?Resolved Problems: ?  * No resolved hospital problems. * ? ?Hospital Course: ?66 year old woman PMH CAD presented with palpitations, heart racing.  Admitted for further evaluation of the same as well as elevated troponins. S/p LHC w/ stent and angioplasty 3/30. Recommend uninterrupted dual antiplatelet therapy with Aspirin '81mg'$  daily and Brilinta for a minimum of 12 months. ? ?*NSTEMI, CAD ?--Doing well s/p LHC w/ balloon and stent ?--Given palpitations, long-term monitor planned.  This will be arranged by cardiology. ?--Continue aspirin and Brilinta.  Cleared for discharge per cardiology. ? ?Type 2 diabetes mellitus with complication, without long-term current use of insulin (HCC) ?-- CBG stable.  Resume home meds on discharge ? ?OSA (obstructive sleep apnea) ?--possible dx. Suggest outpatient sleep study ? ? ?  ? ?Consultants:  ?Cardiology ? ?Procedures performed: ? Dist LM lesion is 25% stenosed. ?  Previously placed Mid LAD stent (unknown type) is  widely patent. ?  Mid RCA lesion is 90% stenosed.  In-stent restenosis from the 2008 Taxus stent.Scoring balloon angioplasty was performed using a BALLN SCOREFLEX 2.50X10.  Postdilatation with a 3.25 [place] balloon. ?   Post intervention, there is a 0% residual stenosis. ?  Prox RCA-2 lesion is 75% stenosed.  Prox RCA-1 lesion is 80% stenosed. ?  A drug-eluting stent was successfully placed using a STENT ONYX FRONTIER 3.0X18. A drug-eluting stent was successfully placed using a STENT ONYX FRONTIER 3.0X12 x two.  Stented segment was optimized with intravascular ultrasound. ?  Post intervention, there is a 0% residual stenosis. ?  ?Continue aggressive secondary prevention.  Would continue dual antiplatelet therapy for at least 12 months.  If she did not have bleeding issues, would continue clopidogrel monotherapy long-term. ? ?Disposition: Home ?Diet recommendation:  ?Discharge Diet Orders (From admission, onward)  ? ?  Start     Ordered  ? 08/26/21 0000  Diet - low sodium heart healthy       ? 08/26/21 1033  ? 08/26/21 0000  Diet Carb Modified       ? 08/26/21 1033  ? ?  ?  ? ?  ? ?Cardiac and Carb modified diet ?DISCHARGE MEDICATION: ?Allergies as of 08/26/2021   ? ?   Reactions  ? Trazodone And Nefazodone Other (See Comments)  ? Makes her feel jittery  ? ?  ? ?  ?Medication List  ?  ? ?STOP taking these medications   ? ?clopidogrel 75 MG tablet ?Commonly known as: PLAVIX ?  ? ?  ? ?TAKE these medications   ? ?ALPHA LIPOIC ACID PO ?Take 1 tablet by mouth daily. With green tea ?  ?amLODipine 10 MG tablet ?Commonly known as: NORVASC ?TAKE 1 TABLET (10 MG TOTAL) BY MOUTH DAILY. ?What changed: when to take this ?  ?aspirin 81 MG chewable  tablet ?Chew 1 tablet (81 mg total) by mouth daily. ?Start taking on: August 27, 2021 ?  ?atenolol 100 MG tablet ?Commonly known as: TENORMIN ?Take 1 tablet (100 mg total) by mouth daily. ?What changed: when to take this ?  ?atorvastatin 40 MG tablet ?Commonly known as: LIPITOR ?Take 1 tablet (40 mg total) by mouth daily. ?What changed: when to take this ?  ?Brilinta 90 MG Tabs tablet ?Generic drug: ticagrelor ?Take 1 tablet (90 mg total) by mouth 2 (two) times daily. ?  ?calcium-vitamin D 500-200 MG-UNIT  tablet ?Commonly known as: OSCAL WITH D ?Take 1 tablet by mouth. ?  ?CHROMIUM PO ?Take 1 tablet by mouth daily. ?  ?dapagliflozin propanediol 10 MG Tabs tablet ?Commonly known as: [place] ?Take 1 tablet (10 mg total) by mouth daily. ?  ?FISH OIL + D3 PO ?Take 1 tablet by mouth daily. ?  ?glipiZIDE 10 MG tablet ?Commonly known as: GLUCOTROL ?TAKE 1 TABLET BY MOUTH TWICE DAILY BEFORE A MEAL ?What changed: how much to take ?  ?hydrochlorothiazide 25 MG tablet ?Commonly known as: HYDRODIURIL ?Take 1 tablet (25 mg total) by mouth daily. ?What changed: when to take this ?  ?Lantus 100 UNIT/ML injection ?Generic drug: insulin glargine ?Inject 0.31 mLs (31 Units total) into the skin daily. ?What changed: when to take this ?  ?losartan 100 MG tablet ?Commonly known as: COZAAR ?TAKE 1 TABLET (100 MG TOTAL) BY MOUTH DAILY. ?What changed:  ?how much to take ?when to take this ?  ?MAGNESIUM PO ?Take 1 tablet by mouth daily. ?  ?metFORMIN 1000 MG tablet ?Commonly known as: GLUCOPHAGE ?TAKE 1 TABLET (1,000 MG TOTAL) BY MOUTH 2 (TWO) TIMES DAILY. ?What changed:  ?how much to take ?when to take this ?  ?MULTIVITAMIN PO ?Take 1 tablet by mouth daily. ?  ?nitroGLYCERIN 0.4 MG SL tablet ?Commonly known as: NITROSTAT ?Place 1 tablet (0.4 mg total) under the tongue every 5 (five) minutes x 3 doses as needed for chest pain. ?  ?TRUEplus Insulin Syringe 30G X 5/16" 0.5 ML Misc ?Generic drug: Insulin Syringe-Needle U-100 ?use as directed to inject insulin ?  ?VITAMIN C PO ?Take 1 tablet by mouth daily. ?  ? ?  ? ? Follow-up Information   ? ? Loel Dubonnet, NP Follow up.   ?Specialty: Cardiology ?Why: Cardiology follow-up has been arranged for you at our [place] location off [facility] on Wednesday Sep 07, 2021 8:25 AM (Arrive by 8:10 AM). Urban Gibson is one of our nurse practitioners with the cardiology team. ?Contact information: ?[address] ?[place] [address] ?[phone] ? ? ?  ?  ? ? [facility] Follow up.   ?Why: The cardiology office will be mailing you a heart monitor to wear for 14 days. ? ?  ?  ? ?  ?  ? ?  ? ?Feels good, no CP or palpitations ? ?Discharge Exam: ?Filed Weights  ? 08/23/21 1730 08/24/21 0335 08/25/21 0530  ?Weight: 79.7 kg 80.1 kg 79.5 kg  ? ?Physical Exam ?Vitals reviewed.  ?Constitutional:   ?   General: She is not in acute distress. ?   Appearance: She is not ill-appearing or toxic-appearing.  ?Cardiovascular:  ?   Rate and Rhythm: Normal rate and regular rhythm.  ?   Heart sounds: No murmur heard. ?Pulmonary:  ?   Effort: Pulmonary effort is normal. No respiratory distress.  ?   Breath sounds: No wheezing, rhonchi or rales.  ?Neurological:  ?  Mental Status: She is alert.  ?Psychiatric:     ?   Mood and Affect: Mood normal.     ?   Behavior: Behavior normal.  ? ?Condition at discharge: good ? ?The results of significant diagnostics from this hospitalization (including imaging, microbiology, ancillary and laboratory) are listed below for reference.  ? ?Imaging Studies: ?CARDIAC CATHETERIZATION ? ?Result Date: 08/25/2021 ?  Dist LM lesion is 25% stenosed.   Previously placed Mid LAD stent (unknown type) is  widely patent.   Mid RCA lesion is 90% stenosed.  In-stent restenosis from the 2008 Taxus stent.Scoring balloon angioplasty was performed using a BALLN SCOREFLEX 2.50X10.  Postdilatation with a 3.25 [place] balloon.   Post intervention, there is a 0% residual stenosis.   Prox RCA-2 lesion is 75% stenosed.  Prox RCA-1 lesion is 80% stenosed.   A drug-eluting stent was successfully placed using a STENT ONYX FRONTIER 3.0X18. A drug-eluting stent was successfully placed using a STENT ONYX FRONTIER 3.0X12 x two.  Stented segment was optimized with intravascular ultrasound.   Post intervention, there is a 0% residual stenosis. Continue aggressive secondary prevention.  Would continue dual antiplatelet therapy for at least 12 months.  If she did not have bleeding issues, would  continue clopidogrel monotherapy long-term.  ? ?DG Chest Port 1 View ? ?Result Date: 08/23/2021 ?CLINICAL DATA:  Chest pain. EXAM: PORTABLE CHEST 1 VIEW COMPARISON:  03/28/2019 FINDINGS: The lungs are clear withou

## 2021-08-26 NOTE — Progress Notes (Signed)
? ?Progress Note ? ?Patient Name: Breanna Obrien ?Date of Encounter: 08/26/2021 ? ?[facility] Cardiologist: None Linard Millers ? ?Subjective  ? ?No complaints ? ?Inpatient Medications  ?  ?Scheduled Meds: ? amLODipine  10 mg Oral QHS  ? aspirin  81 mg Oral Daily  ? atenolol  100 mg Oral QHS  ? atorvastatin  40 mg Oral QHS  ? clopidogrel  75 mg Oral Q breakfast  ? hydrochlorothiazide  25 mg Oral QHS  ? insulin aspart  0-15 Units Subcutaneous TID WC  ? insulin aspart  0-5 Units Subcutaneous QHS  ? insulin glargine-yfgn  30 Units Subcutaneous QHS  ? losartan  100 mg Oral QHS  ? sodium chloride flush  3 mL Intravenous Q12H  ? sodium chloride flush  3 mL Intravenous Q12H  ? ?Continuous Infusions: ? sodium chloride 10 mL/hr at 08/25/21 0000  ? sodium chloride    ? ?PRN Meds: ?sodium chloride, sodium chloride, acetaminophen, nitroGLYCERIN, ondansetron (ZOFRAN) IV, ondansetron (ZOFRAN) IV, sodium chloride flush  ? ?Vital Signs  ?  ?Vitals:  ? 08/25/21 2032 08/25/21 2346 08/26/21 0516 08/26/21 3151  ?BP: 126/66 117/60 98/65 (!) 103/56  ?Pulse: 69 67 64 64  ?Resp:    13  ?Temp: 97.8 ?F (36.6 ?C) 98 ?F (36.7 ?C) 97.8 ?F (36.6 ?C) 98.2 ?F (36.8 ?C)  ?TempSrc: Oral Oral Oral Oral  ?SpO2: 100% 97% 99% 100%  ?Weight:      ?Height:      ? ? ?Intake/Output Summary (Last 24 hours) at 08/26/2021 0830 ?Last data filed at 08/25/2021 1619 ?Gross per 24 hour  ?Intake 1266.97 ml  ?Output 300 ml  ?Net 966.97 ml  ? ? ?  08/25/2021  ?  5:30 AM 08/24/2021  ?  3:35 AM 08/23/2021  ?  5:30 PM  ?Last 3 Weights  ?Weight (lbs) 175 lb 4.3 oz 176 lb 9.6 oz 175 lb 11.3 oz  ?Weight (kg) 79.5 kg 80.105 kg 79.7 kg  ?   ? ?Telemetry  ?  ?NSR - Personally Reviewed ? ?ECG  ?  ?NSR with LAD and 1 degree AV block. - Personally Reviewed ? ?Physical Exam  ? ?GEN: No acute distress.   ?Neck: No JVD ?Cardiac: RRR, no murmurs, rubs, or gallops. Cath site okay. ?Respiratory: Clear to auscultation bilaterally. ?GI: Soft, nontender, non-distended  ?MS: No edema; No  deformity. ?Neuro:  Nonfocal  ?Psych: Normal affect  ? ?Labs  ?  ?High Sensitivity Troponin:   ?Recent Labs  ?Lab 08/23/21 ?7616 08/23/21 ?0737 08/23/21 ?1812 08/23/21 ?2310  ?TROPONINIHS 18* 48* 39* 26*  ?   ?Chemistry ?Recent Labs  ?Lab 08/23/21 ?2310 08/24/21 ?0424 08/25/21 ?1062 08/26/21 ?0229  ?NA  --  135 135 134*  ?K  --  3.6 3.7 3.7  ?CL  --  102 102 100  ?CO2  --  '23 25 26  '$ ?GLUCOSE  --  104* 121* 125*  ?BUN  --  '13 15 17  '$ ?CREATININE  --  0.81 0.92 1.11*  ?CALCIUM  --  9.4 9.4 9.2  ?MG 1.6*  --   --   --   ?GFRNONAA  --  >60 >60 55*  ?ANIONGAP  --  '10 8 8  '$ ?  ?Lipids No results for input(s): CHOL, TRIG, HDL, LABVLDL, LDLCALC, CHOLHDL in the last 168 hours.  ?Hematology ?Recent Labs  ?Lab 08/24/21 ?0424 08/25/21 ?6948 08/26/21 ?0229  ?WBC 6.8 7.0 6.8  ?RBC 4.00 4.10 4.02  ?HGB 12.0 12.3 12.2  ?HCT 36.2  36.9 35.7*  ?MCV 90.5 90.0 88.8  ?MCH 30.0 30.0 30.3  ?MCHC 33.1 33.3 34.2  ?RDW 13.1 12.9 12.8  ?PLT 319 283 307  ? ?Thyroid  ?Recent Labs  ?Lab 08/23/21 ?2310  ?TSH 1.921  ?FREET4 1.05  ?  ?BNPNo results for input(s): BNP, PROBNP in the last 168 hours.  ?DDimer No results for input(s): DDIMER in the last 168 hours.  ? ?Radiology  ?  ?CARDIAC CATHETERIZATION ? ?Result Date: 08/25/2021 ?  Dist LM lesion is 25% stenosed.   Previously placed Mid LAD stent (unknown type) is  widely patent.   Mid RCA lesion is 90% stenosed.  In-stent restenosis from the 2008 Taxus stent.Scoring balloon angioplasty was performed using a BALLN SCOREFLEX 2.50X10.  Postdilatation with a 3.25 [place] balloon.   Post intervention, there is a 0% residual stenosis.   Prox RCA-2 lesion is 75% stenosed.  Prox RCA-1 lesion is 80% stenosed.   A drug-eluting stent was successfully placed using a STENT ONYX FRONTIER 3.0X18. A drug-eluting stent was successfully placed using a STENT ONYX FRONTIER 3.0X12 x two.  Stented segment was optimized with intravascular ultrasound.   Post intervention, there is a 0% residual stenosis. Continue aggressive  secondary prevention.  Would continue dual antiplatelet therapy for at least 12 months.  If she did not have bleeding issues, would continue clopidogrel monotherapy long-term.  ? ?ECHOCARDIOGRAM COMPLETE ? ?Result Date: 08/24/2021 ?   ECHOCARDIOGRAM REPORT   Patient Name:   Breanna Obrien Date of Exam: 08/24/2021 Medical Rec #:  [id number]       Height:       63.0 in Accession #:    [id number]      Weight:       176.6 lb Date of Birth:  Mar 22, 1956       BSA:          1.834 m? Patient Age:    66 years        BP:           125/57 mmHg Patient Gender: F               HR:           68 bpm. Exam Location:  Inpatient Procedure: 2D Echo, Cardiac Doppler and Color Doppler Indications:    NSTEMI I21.4  History:        Patient has prior history of Echocardiogram examinations, most                 recent 10/07/2016. CAD, TIA; Risk Factors:Hypertension,                 Dyslipidemia and Diabetes.  Sonographer:    Bernadene Person RDCS Referring Phys: [id number] [place]  1. Left ventricular ejection fraction, by estimation, is 55%. The left ventricle has normal function. The left ventricle has no regional wall motion abnormalities. Diastolic function lower limit of normal for age.  2. Right ventricular systolic function is normal. The right ventricular size is normal. There is normal pulmonary artery systolic pressure. The estimated right ventricular systolic pressure is 00.9 mmHg.  3. The mitral valve is normal in structure. Trivial mitral valve regurgitation. No evidence of mitral stenosis.  4. The aortic valve is grossly normal. Aortic valve regurgitation is not visualized. No aortic stenosis is present.  5. The inferior vena cava is normal in size with greater than 50% respiratory variability, suggesting right atrial pressure of 3 mmHg. FINDINGS  Left Ventricle: Left ventricular ejection  fraction, by estimation, is 55%. The left ventricle has normal function. The left ventricle has no regional wall motion  abnormalities. The left ventricular internal cavity size was normal in size. There is no left ventricular hypertrophy. Diastolic function lower limit of normal for age. Right Ventricle: The right ventricular size is normal. No increase in right ventricular wall thickness. Right ventricular systolic function is normal. There is normal pulmonary artery systolic pressure. The tricuspid regurgitant velocity is 2.42 m/s, and  with an assumed right atrial pressure of 3 mmHg, the estimated right ventricular systolic pressure is 94.7 mmHg. Left Atrium: Left atrial size was normal in size. Right Atrium: Right atrial size was normal in size. Pericardium: Trivial pericardial effusion is present. Presence of epicardial fat layer. Mitral Valve: The mitral valve is normal in structure. Trivial mitral valve regurgitation. No evidence of mitral valve stenosis. Tricuspid Valve: The tricuspid valve is normal in structure. Tricuspid valve regurgitation is mild . No evidence of tricuspid stenosis. Aortic Valve: The aortic valve is grossly normal. Aortic valve regurgitation is not visualized. No aortic stenosis is present. Pulmonic Valve: The pulmonic valve was normal in structure. Pulmonic valve regurgitation is trivial. No evidence of pulmonic stenosis. Aorta: The aortic root is normal in size and structure. Venous: The inferior vena cava is normal in size with greater than 50% respiratory variability, suggesting right atrial pressure of 3 mmHg. IAS/Shunts: No atrial level shunt detected by color flow Doppler.  LEFT VENTRICLE PLAX 2D LVIDd:         4.00 cm      Diastology LVIDs:         2.80 cm      LV e' medial:    6.46 cm/s LV PW:         0.90 cm      LV E/e' medial:  14.0 LV IVS:        0.90 cm      LV e' lateral:   11.30 cm/s LVOT diam:     1.90 cm      LV E/e' lateral: 8.0 LV SV:         71 LV SV Index:   39 LVOT Area:     2.84 cm?  LV Volumes (MOD) LV vol d, MOD A2C: 83.7 ml LV vol d, MOD A4C: 102.0 ml LV vol s, MOD A2C: 40.6 ml  LV vol s, MOD A4C: 52.0 ml LV SV MOD A2C:     43.1 ml LV SV MOD A4C:     102.0 ml LV SV MOD BP:      47.2 ml RIGHT VENTRICLE RV S prime:     12.00 cm/s TAPSE (M-mode): 2.0 cm LEFT ATRIUM             Index        RIGHT ATRIUM

## 2021-08-26 NOTE — Telephone Encounter (Signed)
? ? ?  Attention TOC pool, ? ?This patient will need a TOC phone call after discharge. They are being discharged today. ?Follow-up appointment has already been arranged with: Laurann Montana NP on 4/12 ?They are a patient of Dr. Tamala Julian. ? ?Thank you! ?Charlie Pitter, PA-C ? ?

## 2021-08-26 NOTE — Progress Notes (Unsigned)
Enrolled patient for a 14 day Zio XT monitor to be mailed to patients home  Dr. Smith to read 

## 2021-08-29 ENCOUNTER — Encounter (HOSPITAL_BASED_OUTPATIENT_CLINIC_OR_DEPARTMENT_OTHER): Payer: Self-pay

## 2021-08-29 ENCOUNTER — Other Ambulatory Visit (HOSPITAL_COMMUNITY): Payer: Self-pay

## 2021-08-30 DIAGNOSIS — R002 Palpitations: Secondary | ICD-10-CM

## 2021-08-30 NOTE — Telephone Encounter (Signed)
I do not yet see that TOC call has been placed. ?Note was routed below from [facility] to [facility] pool but ? Whether this pool is functional. Will route back to [facility] team. ?

## 2021-08-30 NOTE — Telephone Encounter (Signed)
Patient contacted regarding discharge from [facility] on 08/26/21. ? ?Patient understands to follow up with provider Owens Shark, NP on 09/07/21 at 8:25am at [facility]. ?Patient understands discharge instructions? yes ?Patient understands medications and regiment? yes ?Patient understands to bring all medications to this visit? yes ? ? ? ?

## 2021-08-31 ENCOUNTER — Telehealth (HOSPITAL_COMMUNITY): Payer: Self-pay

## 2021-08-31 NOTE — Telephone Encounter (Signed)
Per phase I cardiac rehab, fax cardiac rehab referral to [facility]. 

## 2021-09-02 ENCOUNTER — Telehealth (HOSPITAL_COMMUNITY): Payer: Self-pay

## 2021-09-02 NOTE — Telephone Encounter (Signed)
Transitions of Care Pharmacy  ° °Call attempted for a pharmacy transitions of care follow-up. HIPAA appropriate voicemail was left with call back information provided.  ° °Call attempt #1. Will follow-up in 2-3 days.  °  °

## 2021-09-05 ENCOUNTER — Other Ambulatory Visit (HOSPITAL_COMMUNITY): Payer: Self-pay

## 2021-09-05 ENCOUNTER — Encounter: Payer: Self-pay | Admitting: Internal Medicine

## 2021-09-05 DIAGNOSIS — E1169 Type 2 diabetes mellitus with other specified complication: Secondary | ICD-10-CM

## 2021-09-05 DIAGNOSIS — E1159 Type 2 diabetes mellitus with other circulatory complications: Secondary | ICD-10-CM

## 2021-09-05 NOTE — Progress Notes (Signed)
? ? ?Office Visit  ?  ?Patient Name: Breanna Obrien ?Date of Encounter: 09/07/2021 ? ?Primary Care Provider:  [name], MD ?Primary Cardiologist:  Buford Dresser, MD ?Primary Electrophysiologist: None ?Chief Complaint  ?  ?Post hospital follow-up CAD ? ? Patient Profile: ?HTN ?CAD s/p DES 2008 ?DM type II IDDM ?CVA ?Bilateral carotid artery stenosis ? ? Recent Studies: ?5/18 TTE:  LVEF 60-65%, no RWMA ?3/29 TTE: LVEF 55%, no RWMA, no LVH, normal valvular structures ?3/30 LHC: 90% stenosed mid RCA treated with DES x1 ? ?History of Present Illness  ?  ?Breanna Obrien is a 66 y.o. female with PMH of CAD s/p DES to LAD x 2 ( 2008 in [place]) and mid RCA (2023), HTN, IDDM, CVA 5/18, and Bell's palsy.  Patient presented to [facility] ED with complaints of tachycardia that awakened her from her sleep and deemed NSTEMI vs  ACS was transferred to [facility] for further work-up. hsTn 18>>48>>39>>26 and EKG with ST and minimal ST depression, patient was scheduled for left heart cath.  Cath completed and revealed 90% stenosed mid RCA that was treated with DES x1. Patient was discharged on DAPT with Brilinta and ASA 81 mg, and long-term monitor to evaluate palpitations. ? ?Since discharge from hospital the patient reports doing well and denies any chest pain or shortness of breath.  She does still endorse increased heart palpitations with activity and at rest.  She is currently walking for exercise daily. She denies chest pain,  dyspnea, PND, orthopnea, nausea, vomiting, dizziness, syncope, edema, weight gain, or early satiety.  We reviewed the importance of medication compliance and patient stated that currently she has no barriers to medication affordability, however she would like assistance paperwork to complete for Brilinta. ? ?Past Medical History  ?  ?Past Medical History:  ?Diagnosis Date  ? Allergy   ? Bell's palsy   ? CAD (coronary artery disease)   ? Diabetes mellitus without complication ([place])   ? Gallstones   ?  Heart disease   ? HLD (hyperlipidemia)   ? Hypertension   ? Mini stroke   ? TIA (transient ischemic attack) 06/30/2016  ? ?Past Surgical History:  ?Procedure Laterality Date  ? CHOLECYSTECTOMY    ? CORONARY ANGIOPLASTY WITH STENT PLACEMENT    ? CORONARY STENT INTERVENTION N/A 08/25/2021  ? Procedure: CORONARY STENT INTERVENTION;  Surgeon: Jettie Booze, MD;  Location: [place];  Service: Cardiovascular;  Laterality: N/A;  ? INTRAVASCULAR ULTRASOUND/IVUS N/A 08/25/2021  ? Procedure: Intravascular Ultrasound/IVUS;  Surgeon: Jettie Booze, MD;  Location: [facility];  Service: Cardiovascular;  Laterality: N/A;  ? LEFT HEART CATH AND CORONARY ANGIOGRAPHY N/A 08/25/2021  ? Procedure: LEFT HEART CATH AND CORONARY ANGIOGRAPHY;  Surgeon: Jettie Booze, MD;  Location: [place] [facility];  Service: Cardiovascular;  Laterality: N/A;  ? TONSILLECTOMY    ? ? ?Allergies ? ?Allergies  ?Allergen Reactions  ? Trazodone And Nefazodone Other (See Comments)  ?  Makes her feel jittery  ? ? ?Home Medications  ?  ?Current Outpatient Medications  ?Medication Sig Dispense Refill  ? ALPHA LIPOIC ACID PO Take 1 tablet by mouth daily. With green tea    ? amLODipine (NORVASC) 10 MG tablet TAKE 1 TABLET (10 MG TOTAL) BY MOUTH DAILY. 90 tablet 3  ? Ascorbic Acid (VITAMIN C PO) Take 1 tablet by mouth daily.    ? aspirin 81 MG chewable tablet Chew 1 tablet (81 mg total) by mouth daily.    ?  atorvastatin (LIPITOR) 40 MG tablet Take 1 tablet (40 mg total) by mouth at bedtime. 90 tablet 3  ? calcium-vitamin D (OSCAL WITH D) 500-200 MG-UNIT tablet Take 1 tablet by mouth.    ? CHROMIUM PO Take 1 tablet by mouth daily.    ? dapagliflozin propanediol (FARXIGA) 10 MG TABS tablet Take 1 tablet (10 mg total) by mouth daily. 90 tablet 3  ? Fish Oil-Cholecalciferol (FISH OIL + D3 PO) Take 1 tablet by mouth daily.    ? glipiZIDE (GLUCOTROL) 10 MG tablet TAKE 1 TABLET BY MOUTH TWICE DAILY BEFORE A MEAL (Patient taking  differently: Take 10 mg by mouth 2 (two) times daily before a meal.) 180 tablet 3  ? hydrochlorothiazide (HYDRODIURIL) 25 MG tablet Take 1 tablet (25 mg total) by mouth daily. 90 tablet 3  ? insulin glargine (LANTUS) 100 UNIT/ML injection Inject 0.31 mLs (31 Units total) into the skin at bedtime. 30 mL 6  ? Insulin Syringe-Needle U-100 (INSULIN SYRINGE .5CC/30GX5/16") 30G X 5/16" 0.5 ML MISC use as directed to inject insulin 100 each 6  ? losartan (COZAAR) 100 MG tablet TAKE 1 TABLET (100 MG TOTAL) BY MOUTH DAILY. 90 tablet 3  ? MAGNESIUM PO Take 1 tablet by mouth daily.    ? metFORMIN (GLUCOPHAGE) 1000 MG tablet TAKE 1 TABLET (1,000 MG TOTAL) BY MOUTH 2 (TWO) TIMES DAILY. 180 tablet 3  ? metoprolol succinate (TOPROL-XL) 100 MG 24 hr tablet Take 1 tablet (100 mg total) by mouth daily. Take with or immediately following a meal. 90 tablet 3  ? Multiple Vitamin (MULTIVITAMIN PO) Take 1 tablet by mouth daily.    ? nitroGLYCERIN (NITROSTAT) 0.4 MG SL tablet Place 1 tablet (0.4 mg total) under the tongue every 5 (five) minutes x 3 doses as needed for chest pain. 25 tablet 1  ? ticagrelor (BRILINTA) 90 MG TABS tablet Take 1 tablet (90 mg total) by mouth 2 (two) times daily. 60 tablet 1  ? ?No current facility-administered medications for this visit.  ?  ? ?Review of Systems  ?Please see the history of present illness.    ?(+) palpitations at rest ?All other systems reviewed and are otherwise negative except as noted above. ? ?Physical Exam  ?  ?Wt Readings from Last 3 Encounters:  ?09/07/21 178 lb 11.2 oz (81.1 kg)  ?08/25/21 175 lb 4.3 oz (79.5 kg)  ?02/04/21 183 lb 3.2 oz (83.1 kg)  ? ?VS: ?Vitals:  ? 09/07/21 0808  ?BP: 126/60  ?Pulse: 95  ?SpO2: 98%  ?,Body mass index is 31.66 kg/m?. ? ?Constitutional:   ?   Appearance: Healthy appearance. Not in distress.  ?Neck:  ?   Vascular: JVD normal.  ?Pulmonary:  ?   Effort: Pulmonary effort is normal.  ?   Breath sounds: No wheezing. No rales.  ?Cardiovascular:  ?   Normal  rate. Regular rhythm. Normal S1. Normal S2.   ?   Murmurs: There is no murmur. Right Radial cath site  with no hematoma nor signs of infection, healing appropriately' ?Edema: ?   Peripheral edema absent.  ?Abdominal:  ?   Palpations: Abdomen is soft. There is no hepatomegaly.  ?Skin: ?   General: Skin is warm and dry.  ?Neurological:  ?   General: No focal deficit present.  ?   Mental Status: Alert and oriented to person, place and time.  ?   Cranial Nerves: Cranial nerves are intact.  ?EKG/LABS/Other Studies Reviewed  ?  ?ECG personally reviewed by me  today -sinus rhythm and nonspecific T wave abnormality, with rate of 95- no acute changes. ? ?Lab Results  ?Component Value Date  ? WBC 6.8 08/26/2021  ? HGB 12.2 08/26/2021  ? HCT 35.7 (L) 08/26/2021  ? MCV 88.8 08/26/2021  ? PLT 307 08/26/2021  ? ?Lab Results  ?Component Value Date  ? CREATININE 1.11 (H) 08/26/2021  ? BUN 17 08/26/2021  ? NA 134 (L) 08/26/2021  ? K 3.7 08/26/2021  ? CL 100 08/26/2021  ? CO2 26 08/26/2021  ? ?Lab Results  ?Component Value Date  ? ALT 58 (H) 09/20/2020  ? AST 30 09/20/2020  ? ALKPHOS 95 09/20/2020  ? BILITOT 0.3 09/20/2020  ? ?Lab Results  ?Component Value Date  ? CHOL 163 09/20/2020  ? HDL 40 09/20/2020  ? [place] 93 09/20/2020  ? TRIG 172 (H) 09/20/2020  ? CHOLHDL 4.1 09/20/2020  ?  ?Lab Results  ?Component Value Date  ? HGBA1C 7.4 (H) 08/23/2021  ? ? ?Assessment & Plan  ?  ?1.  NSTEMI/CAD s/p coronary PCI: ?-Continue DAPT with ASA 81 mg daily and Brilinta 90 mg daily for 1 year. ?-Stable with no anginal symptoms. No indication for ischemic evaluation.   ?-GDMT with ASA 81 mg daily, Toprol XL 100 daily, atorvastatin 40 mg daily, and Farxiga 10 mg ?-Patient completed assistance paperwork for Brilinta in office today. ?-Patient is cleared to begin cardiac rehab  ? ?2.  Hypertension: ?-Blood pressure well controlled  today was 126/60 ?-Continue losartan 100 mg daily, Norvasc 10 mg daily, HCTZ 25 mg daily, and Toprol XL 100 mg  daily ? ?3.  Palpitations: ?-14-day ZIO monitor mailed to patient and is currently wearing today.  She is scheduled to complete monitor on  ?-Patient is noticing increase in palpitations at rest and states that per her App

## 2021-09-06 ENCOUNTER — Telehealth (HOSPITAL_COMMUNITY): Payer: Self-pay

## 2021-09-06 MED ORDER — METFORMIN HCL 1000 MG PO TABS: ORAL_TABLET | Freq: Two times a day (BID) | ORAL | 3 refills | Status: DC

## 2021-09-06 MED ORDER — ATORVASTATIN CALCIUM 40 MG PO TABS: 40.0000 mg | ORAL_TABLET | Freq: Every day | ORAL | 3 refills | Status: DC

## 2021-09-06 MED ORDER — LOSARTAN POTASSIUM 100 MG PO TABS: ORAL_TABLET | Freq: Every day | ORAL | 3 refills | Status: DC

## 2021-09-06 MED ORDER — ATENOLOL 100 MG PO TABS: 100.0000 mg | ORAL_TABLET | Freq: Every day | ORAL | 3 refills | Status: DC

## 2021-09-06 MED ORDER — TICAGRELOR 90 MG PO TABS: 90.0000 mg | ORAL_TABLET | Freq: Two times a day (BID) | ORAL | 1 refills | Status: DC

## 2021-09-06 MED ORDER — HYDROCHLOROTHIAZIDE 25 MG PO TABS: 25.0000 mg | ORAL_TABLET | Freq: Every day | ORAL | 3 refills | Status: DC

## 2021-09-06 MED ORDER — INSULIN GLARGINE 100 UNIT/ML ~~LOC~~ SOLN
31.0000 [IU] | Freq: Every day | SUBCUTANEOUS | 6 refills | Status: DC
Start: 2021-09-06 — End: 2021-09-07

## 2021-09-06 MED ORDER — AMLODIPINE BESYLATE 10 MG PO TABS: ORAL_TABLET | Freq: Every day | ORAL | 3 refills | Status: DC

## 2021-09-06 NOTE — Telephone Encounter (Signed)
Transitions of Care Pharmacy   Call attempted for a pharmacy transitions of care follow-up. HIPAA appropriate voicemail was left with call back information provided.   Call attempt #2. Will follow-up in 2-3 days.    

## 2021-09-07 ENCOUNTER — Ambulatory Visit (INDEPENDENT_AMBULATORY_CARE_PROVIDER_SITE_OTHER): Payer: Medicare PPO | Admitting: Nurse Practitioner

## 2021-09-07 ENCOUNTER — Telehealth (HOSPITAL_COMMUNITY): Payer: Self-pay

## 2021-09-07 ENCOUNTER — Other Ambulatory Visit: Payer: Self-pay

## 2021-09-07 ENCOUNTER — Other Ambulatory Visit: Payer: Self-pay | Admitting: Internal Medicine

## 2021-09-07 ENCOUNTER — Telehealth (HOSPITAL_BASED_OUTPATIENT_CLINIC_OR_DEPARTMENT_OTHER): Payer: Self-pay

## 2021-09-07 ENCOUNTER — Telehealth: Payer: Self-pay | Admitting: Internal Medicine

## 2021-09-07 ENCOUNTER — Encounter (INDEPENDENT_AMBULATORY_CARE_PROVIDER_SITE_OTHER): Payer: Medicare PPO | Admitting: Cardiology

## 2021-09-07 ENCOUNTER — Encounter (HOSPITAL_BASED_OUTPATIENT_CLINIC_OR_DEPARTMENT_OTHER): Payer: Self-pay | Admitting: Nurse Practitioner

## 2021-09-07 VITALS — BP 126/60 | HR 95 | Ht 63.0 in | Wt 178.7 lb

## 2021-09-07 DIAGNOSIS — E118 Type 2 diabetes mellitus with unspecified complications: Secondary | ICD-10-CM

## 2021-09-07 DIAGNOSIS — R002 Palpitations: Secondary | ICD-10-CM | POA: Diagnosis not present

## 2021-09-07 DIAGNOSIS — G4733 Obstructive sleep apnea (adult) (pediatric): Secondary | ICD-10-CM

## 2021-09-07 DIAGNOSIS — Z9861 Coronary angioplasty status: Secondary | ICD-10-CM

## 2021-09-07 DIAGNOSIS — I10 Essential (primary) hypertension: Secondary | ICD-10-CM

## 2021-09-07 DIAGNOSIS — R0683 Snoring: Secondary | ICD-10-CM

## 2021-09-07 DIAGNOSIS — I2510 Atherosclerotic heart disease of native coronary artery without angina pectoris: Secondary | ICD-10-CM

## 2021-09-07 DIAGNOSIS — I214 Non-ST elevation (NSTEMI) myocardial infarction: Secondary | ICD-10-CM

## 2021-09-07 DIAGNOSIS — E785 Hyperlipidemia, unspecified: Secondary | ICD-10-CM | POA: Diagnosis not present

## 2021-09-07 MED ORDER — METOPROLOL SUCCINATE ER 100 MG PO TB24
100.0000 mg | ORAL_TABLET | Freq: Every day | ORAL | 3 refills | Status: DC
  Filled 2021-09-07: qty 90, 90d supply, fill #0

## 2021-09-07 MED ORDER — METOPROLOL SUCCINATE ER 100 MG PO TB24: 100.0000 mg | ORAL_TABLET | Freq: Every day | ORAL | 3 refills | Status: DC

## 2021-09-07 MED ORDER — INSULIN GLARGINE 100 UNIT/ML ~~LOC~~ SOLN: 27.0000 [IU] | Freq: Every day | SUBCUTANEOUS | 11 refills | Status: DC

## 2021-09-07 MED ORDER — INSULIN GLARGINE 100 UNIT/ML SOLOSTAR PEN
PEN_INJECTOR | SUBCUTANEOUS | 6 refills | Status: DC
  Filled 2021-09-07: qty 15, 48d supply, fill #0

## 2021-09-07 NOTE — Patient Instructions (Addendum)
Medication Instructions:  ?Your physician has recommended you make the following change in your medication:  ? ?Stop: Atenolol  ? ?Start: Metoprolol Succinate '100mg'$  daily  ? ? ?*If you need a refill on your cardiac medications before your next appointment, please call your pharmacy* ? ?Follow-Up: ?At [facility], you and your health needs are our priority.  As part of our continuing mission to provide you with exceptional heart care, we have created designated Provider Care Teams.  These Care Teams include your primary Cardiologist (physician) and Advanced Practice Providers (APPs -  Physician Assistants and Nurse Practitioners) who all work together to provide you with the care you need, when you need it. ? ?We recommend signing up for the patient portal called "MyChart".  Sign up information is provided on this After Visit Summary.  MyChart is used to connect with patients for Virtual Visits (Telemedicine).  Patients are able to view lab/test results, encounter notes, upcoming appointments, etc.  Non-urgent messages can be sent to your provider as well.   ?To learn more about what you can do with MyChart, go to [email or web address].   ? ?Your next appointment:   ?3 month(s) ? ?The format for your next appointment:   ?In Person ? ?Provider:   ?Dr. Harrell Gave as a new patient  ? ?If primary card or EP is not listed click here to update    :1}  ? ? ?Other Instructions:  ?You have been referred to cardiac rehab. This is a combination program including monitored exercise, dietary education, and support group. We strongly recommend participating in the program. Expect a phone call from them in approximately 2 weeks.  ?Please send [place] a mychart message in one week with your blood pressure and heart rates to see if further medication adjustments need to be made  ? ? ?Important Information About Sugar ? ? ? ? ? ? ?

## 2021-09-07 NOTE — Telephone Encounter (Signed)
Transitions of Care Pharmacy   Call attempted for a pharmacy transitions of care follow-up. HIPAA appropriate voicemail was left with call back information provided.   Call attempt #3. Will no longer attempt follow up for TOC pharmacy.   

## 2021-09-07 NOTE — Telephone Encounter (Signed)
Gave sleep study activation code  ?

## 2021-09-07 NOTE — Telephone Encounter (Signed)
Phone call placed to patient today to clarify her request for Lantus insulin [facility].  Patient states she is not taking 31 units at bedtime.  She states that she decreased the dose to 27 units because when she takes 31 units blood sugars drop in the middle of the night.  I told her that I will send a prescription for the Lantus insulin with instructions to take 27 units at bedtime.  Also went over with her signs and symptoms of hypoglycemia and how to treat. ?

## 2021-09-08 ENCOUNTER — Encounter (HOSPITAL_BASED_OUTPATIENT_CLINIC_OR_DEPARTMENT_OTHER): Payer: Self-pay

## 2021-09-11 NOTE — Addendum Note (Signed)
Addended by: Fransico Him R on: 09/11/2021 11:17 AM ? ? Modules accepted: Orders ? ?

## 2021-09-11 NOTE — Procedures (Signed)
? ?  SLEEP STUDY REPORT ?Patient Information ?Study Date: 09/07/21 ?Patient Name: Breanna Obrien ?Patient ID: [id number] ?Birth Date: 07-May-2056 ?Age: 66 ?Gender: Female ?BMI: 31.6 (W=178 lb, H=5' 3'') ?Referring Physician: Laurann Montana, NP ? ?TEST DESCRIPTION: Home sleep apnea testing was completed using the WatchPat, a Type 1 device, utilizing ?peripheral arterial tonometry (PAT), chest movement, actigraphy, pulse oximetry, pulse rate, body position and snore. ?AHI was calculated with apnea and hypopnea using valid sleep time as the denominator. RDI includes apneas, ?hypopneas, and RERAs. The data acquired and the scoring of sleep and all associated events were performed in ?accordance with the recommended standards and specifications as outlined in the AASM Manual for the Scoring of ?Sleep and Associated Events 2.2.0 (2015). ? ?FINDINGS: ?1. Mild Obstructive Sleep Apnea with AHI 8.7/hr. ?2. No Central Sleep Apnea with pAHIc 0.2/hr. ?3. Oxygen desaturations as low as 84%. ?4. Moderate snoring was present. O2 sats were < 88% for 4.7 min. ?5. Total sleep time was 6 hrs and 26 min. ?6. 20.9% of total sleep time was spent in REM sleep. ?7. Normal sleep onset latency at 19 min. ?8. Prolonged REM sleep onset latency at 217 min. ?9. Total awakenings were 7. ? ?DIAGNOSIS: ?Mild Obstructive Sleep Apnea (G47.33) ? ?RECOMMENDATIONS: ?1. Clinical correlation of these findings is necessary. The decision to treat obstructive sleep apnea (OSA) is usually ?based on the presence of apnea symptoms or the presence of associated medical conditions such as Hypertension, ?Congestive Heart Failure, Atrial Fibrillation or Obesity. The most common symptoms of OSA are snoring, gasping for ?breath while sleeping, daytime sleepiness and fatigue. ? ?2. Initiating apnea therapy is recommended given the presence of symptoms and/or associated conditions. ?Recommend proceeding with one of the following: ? ? a. Auto-CPAP therapy with a pressure  range of 5-20cm H2O. ? ? b. An oral appliance (OA) that can be obtained from certain dentists with expertise in sleep medicine. These are ?primarily of use in non-obese patients with mild and moderate disease. ? ? c. An ENT consultation which may be useful to look for specific causes of obstruction and possible treatment ?options. ? ? d. If patient is intolerant to PAP therapy, consider referral to ENT for evaluation for hypoglossal nerve stimulator. ? ?3. Close follow-up is necessary to ensure success with CPAP or oral appliance therapy for maximum benefit . ? ?4. A follow-up oximetry study on CPAP is recommended to assess the adequacy of therapy and determine the need ?for supplemental oxygen or the potential need for Bi-level therapy. An arterial blood gas to determine the adequacy of ?baseline ventilation and oxygenation should also be considered. ? ?5. Healthy sleep recommendations include: adequate nightly sleep (normal 7-9 hrs/night), avoidance of caffeine after ?noon and alcohol near bedtime, and maintaining a sleep environment that is cool, dark and quiet. ? ?6. Weight loss for overweight patients is recommended. Even modest amounts of weight loss can significantly ?improve the severity of sleep apnea. ? ?7. Snoring recommendations include: weight loss where appropriate, side sleeping, and avoidance of alcohol before ?bed. ? ?8. Operation of motor vehicle should be avoided when sleepy. ? ?Signature: ?Electronically Signed: 09/11/21 ?Fransico Him, MD; [facility]; [place], [place] Board of Sleep Medicine ? ?

## 2021-09-14 ENCOUNTER — Ambulatory Visit: Payer: Medicare PPO

## 2021-09-14 ENCOUNTER — Encounter (HOSPITAL_BASED_OUTPATIENT_CLINIC_OR_DEPARTMENT_OTHER): Payer: Self-pay

## 2021-09-14 DIAGNOSIS — R0683 Snoring: Secondary | ICD-10-CM

## 2021-09-15 ENCOUNTER — Telehealth: Payer: Self-pay | Admitting: *Deleted

## 2021-09-15 NOTE — Telephone Encounter (Signed)
Left message to return a call to discuss HST results. 

## 2021-09-15 NOTE — Telephone Encounter (Signed)
-----   Message from Sueanne Margarita, MD sent at 09/11/2021 11:16 AM EDT ----- ?Order ResMed CPAP on auto from 4 to 15cm H2O with heated humidity, mask of choice and get an overnight pulse ox on PAP therapy.  Followup in 6 weeks with me.  ?  ?

## 2021-09-15 NOTE — Telephone Encounter (Signed)
Patient notified of results and MD recommendations of HST. She agrees to proceed with CPAP therapy. APAP order sent to [facility] per Dr Radford Pax. ?

## 2021-09-17 ENCOUNTER — Other Ambulatory Visit (HOSPITAL_COMMUNITY): Payer: Self-pay

## 2021-09-19 ENCOUNTER — Other Ambulatory Visit: Payer: Self-pay

## 2021-09-19 ENCOUNTER — Other Ambulatory Visit (HOSPITAL_COMMUNITY): Payer: Self-pay

## 2021-09-19 DIAGNOSIS — R002 Palpitations: Secondary | ICD-10-CM | POA: Diagnosis not present

## 2021-09-21 ENCOUNTER — Encounter (HOSPITAL_BASED_OUTPATIENT_CLINIC_OR_DEPARTMENT_OTHER): Payer: Self-pay

## 2021-09-21 NOTE — Telephone Encounter (Signed)
BP as log as requested  ?

## 2021-09-28 ENCOUNTER — Telehealth: Payer: Self-pay

## 2021-09-28 MED ORDER — METOPROLOL SUCCINATE ER 50 MG PO TB24: 50.0000 mg | ORAL_TABLET | Freq: Every day | ORAL | 3 refills | Status: DC

## 2021-09-28 NOTE — Telephone Encounter (Signed)
-----   Message from Belva Crome, MD sent at 09/26/2021 10:05 AM EDT ----- ?Let the patient know I am unable to explain the rapid heart rate.  Be certain that she is taking the Toprol-XL as prescribed.  If so I will recommend increasing Toprol XL to 100 mg in a.m. and 50 mg p.m. to see if this helps.  Give follow-up after several weeks.  The monitor did not demonstrate any worrisome or life-threatening findings. ?A copy will be sent to Stacie Glaze, DO ?

## 2021-09-28 NOTE — Telephone Encounter (Signed)
Spoke with patient to discuss monitor results. ? ?Per Dr. Tamala Julian: ?Let the patient know I am unable to explain the rapid heart rate.  Be certain that she is taking the Toprol-XL as prescribed.  If so I will recommend increasing Toprol XL to 100 mg in a.m. and 50 mg p.m. to see if this helps.  Give follow-up after several weeks.  The monitor did not demonstrate any worrisome or life-threatening findings. ? ?Patient verbalized understanding and agrees with plan above. ?

## 2021-10-15 ENCOUNTER — Encounter: Payer: Self-pay | Admitting: Internal Medicine

## 2021-10-16 ENCOUNTER — Other Ambulatory Visit: Payer: Self-pay | Admitting: Internal Medicine

## 2021-10-16 MED ORDER — "INSULIN SYRINGE-NEEDLE U-100 30G X 5/16"" 0.5 ML MISC": 11 refills | Status: AC

## 2021-10-16 MED ORDER — GLIPIZIDE 10 MG PO TABS: ORAL_TABLET | Freq: Two times a day (BID) | ORAL | 3 refills | Status: DC

## 2021-10-31 ENCOUNTER — Ambulatory Visit: Payer: Medicare PPO | Attending: Internal Medicine | Admitting: Internal Medicine

## 2021-10-31 VITALS — BP 132/77 | HR 69 | Temp 98.1°F | Resp 16 | Wt 174.8 lb

## 2021-10-31 DIAGNOSIS — Z23 Encounter for immunization: Secondary | ICD-10-CM

## 2021-10-31 DIAGNOSIS — I2510 Atherosclerotic heart disease of native coronary artery without angina pectoris: Secondary | ICD-10-CM | POA: Diagnosis not present

## 2021-10-31 DIAGNOSIS — I152 Hypertension secondary to endocrine disorders: Secondary | ICD-10-CM

## 2021-10-31 DIAGNOSIS — E1159 Type 2 diabetes mellitus with other circulatory complications: Secondary | ICD-10-CM | POA: Diagnosis not present

## 2021-10-31 DIAGNOSIS — I739 Peripheral vascular disease, unspecified: Secondary | ICD-10-CM | POA: Diagnosis not present

## 2021-10-31 LAB — GLUCOSE, POCT (MANUAL RESULT ENTRY): POC Glucose: 128 mg/dl — AB (ref 70–99)

## 2021-10-31 NOTE — Progress Notes (Signed)
Patient c/o feeling extra fatigue since March. Patient is not sure if it has something to do with her medication

## 2021-10-31 NOTE — Progress Notes (Signed)
Patient ID: Breanna Obrien, female    DOB: [birth year]  MRN: [id number]  CC: Chronic disease management  Subjective: Breanna Obrien is a 66 y.o. female who presents for chronic disease management Her concerns today include:  left pontine CVA (09/2016), HTN, HL, diabetes type 2, BL carotid artery stenosis, and hx of CAD (stent x 2 per pt's old medical records, 3 stents to RCA 07/2021).  Since last visit with me in February, patient was admitted to the hospital 07/2021 with NSTEMI.  Had balloon and stent placed to RCA.  Started on DAPT with Brilinta and aspirin.  Saw cardiology NP 08/2021.  Advised to continue DAPT, Toprol, atorvastatin and Farxiga. -Had Zio patch placed for palpitations.  Noted to have frequent sinus tachycardia with heart rate up to 145 bpm.  Dose of metoprolol increased by cardiologist to 100 mg a.m and 50 mg p.m daily. - no CP/SOB.  Feels tired some days.  Walking 15 mins 5 days/wk.  Reports soreness in both calf after she completes her 15 mins of walking No further palpitations  HTN: Reports compliance with Cozaar 100 mg, amlodipine 10 mg, Toprol 100 mg, HCTZ 25 mg daily., Checks BP 2-3 times a day. Readings have been within recommended range -Limits salt in foods  DM: Results for orders placed or performed in visit on 10/31/21  POCT glucose (manual entry)  Result Value Ref Range   POC Glucose 128 (A) 70 - 99 mg/dl   Lab Results  Component Value Date   HGBA1C 7.4 (H) 08/23/2021  Checks BS 3-4x/day.  Range 150-180 after meals; 90-120s before meals. Occasional low BS if she eats later than she should. Can tell when BS low Compliant with Lantus 27 units, Glipizide, Metformin and Farxiga Doing good with eating habits.  HM: Plans to get 2nd Shingrix at her pharmacy.  Due for eye exam.  Referral submitted on last visit.  She was sent a letter with names of ophthalmologist and optometrist in the area who are in network for her insurance.  She plans to call one of them and  make an appointment.  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 11/12/2020   Abnormal mammogram 01/15/2017   CAD S/P percutaneous coronary angioplasty 01/15/2017   Hx of Bell's palsy 01/15/2017   Insomnia due to medical condition 10/24/2016   Depression 10/24/2016   Type 2 diabetes mellitus with complication, without long-term current use of insulin ([place]) 10/06/2016   Essential hypertension 10/06/2016   Ataxia    Hyperlipidemia    Bilateral carotid artery stenosis    CVA (cerebral vascular accident) ([place]) 10/05/2016   TIA (transient ischemic attack) 06/30/2016     Current Outpatient Medications on File Prior to Visit  Medication Sig Dispense Refill   ALPHA LIPOIC ACID PO Take 1 tablet by mouth daily. With green tea     amLODipine (NORVASC) 10 MG tablet TAKE 1 TABLET (10 MG TOTAL) BY MOUTH DAILY. 90 tablet 3   Ascorbic Acid (VITAMIN C PO) Take 1 tablet by mouth daily.     aspirin 81 MG chewable tablet Chew 1 tablet (81 mg total) by mouth daily.     atorvastatin (LIPITOR) 40 MG tablet Take 1 tablet (40 mg total) by mouth  at bedtime. 90 tablet 3   calcium-vitamin D (OSCAL WITH D) 500-200 MG-UNIT tablet Take 1 tablet by mouth.     CHROMIUM PO Take 1 tablet by mouth daily.     dapagliflozin propanediol (FARXIGA) 10 MG TABS tablet Take 1 tablet (10 mg total) by mouth daily. 90 tablet 3   Fish Oil-Cholecalciferol (FISH OIL + D3 PO) Take 1 tablet by mouth daily.     glipiZIDE (GLUCOTROL) 10 MG tablet TAKE 1 TABLET BY MOUTH TWICE DAILY BEFORE A MEAL 180 tablet 3   hydrochlorothiazide (HYDRODIURIL) 25 MG tablet Take 1 tablet (25 mg total) by mouth daily. 90 tablet 3   insulin glargine (LANTUS) 100  UNIT/ML injection Inject 0.27 mLs (27 Units total) into the skin at bedtime. 30 mL 11   Insulin Syringe-Needle U-100 (TRUEPLUS INSULIN SYRINGE) 30G X 5/16" 0.5 ML MISC UAD 100 each 11   losartan (COZAAR) 100 MG tablet TAKE 1 TABLET (100 MG TOTAL) BY MOUTH DAILY. 90 tablet 3   MAGNESIUM PO Take 1 tablet by mouth daily.     metFORMIN (GLUCOPHAGE) 1000 MG tablet TAKE 1 TABLET (1,000 MG TOTAL) BY MOUTH 2 (TWO) TIMES DAILY. 180 tablet 3   metoprolol succinate (TOPROL-XL) 100 MG 24 hr tablet Take 1 tablet (100 mg total) by mouth daily. Take with or immediately following a meal. 90 tablet 3   metoprolol succinate (TOPROL-XL) 50 MG 24 hr tablet Take 1 tablet (50 mg total) by mouth daily. Take with or immediately following a meal, take in the evening. 90 tablet 3   Multiple Vitamin (MULTIVITAMIN PO) Take 1 tablet by mouth daily.     nitroGLYCERIN (NITROSTAT) 0.4 MG SL tablet Place 1 tablet (0.4 mg total) under the tongue every 5 (five) minutes x 3 doses as needed for chest pain. 25 tablet 1   ticagrelor (BRILINTA) 90 MG TABS tablet Take 1 tablet (90 mg total) by mouth 2 (two) times daily. 60 tablet 1   No current facility-administered medications on file prior to visit.    Allergies  Allergen Reactions   Trazodone And Nefazodone Other (See Comments)    Makes her feel jittery    Social History   Socioeconomic History   Marital status: Married    Spouse name: Not on file   Number of children: 1   Years of education: Not on file   Highest education level: Not on file  Occupational History   Occupation: retired  Tobacco Use   Smoking status: Former    Types: Cigarettes    Quit date: 1996    Years since quitting: 27.4   Smokeless tobacco: Never  Vaping Use   Vaping Use: Never used  Substance and Sexual Activity   Alcohol use: No   Drug use: Never   Sexual activity: Yes    Birth control/protection: None, Post-menopausal  Other Topics Concern   Not on file  Social History Narrative    Not on file   Social Determinants of Health   Financial Resource Strain: Not on file  Food Insecurity: No Food Insecurity   Worried About Charity fundraiser in the Last Year: Never true   [place] in the Last Year: Never true  Transportation Needs: No Transportation Needs   Lack of Transportation (Medical): No   Lack of Transportation (Non-Medical): No  Physical Activity: Not on file  Stress: Not on file  Social Connections: Not on file  Intimate Partner Violence: Not on file    Family  History  Problem Relation Age of Onset   Pancreatic cancer Mother    Diabetes Mother    Heart attack Father    Diabetes Brother    Liver disease Brother        drinker   Hypertension Brother    Hypertension Brother    HIV/AIDS Brother    Breast cancer Maternal Aunt    Rectal cancer Neg Hx    Colon cancer Neg Hx    Esophageal cancer Neg Hx     Past Surgical History:  Procedure Laterality Date   CHOLECYSTECTOMY     CORONARY ANGIOPLASTY WITH STENT PLACEMENT     CORONARY STENT INTERVENTION N/A 08/25/2021   Procedure: CORONARY STENT INTERVENTION;  Surgeon: Jettie Booze, MD;  Location: [facility];  Service: Cardiovascular;  Laterality: N/A;   INTRAVASCULAR ULTRASOUND/IVUS N/A 08/25/2021   Procedure: Intravascular Ultrasound/IVUS;  Surgeon: Jettie Booze, MD;  Location: [facility];  Service: Cardiovascular;  Laterality: N/A;   LEFT HEART CATH AND CORONARY ANGIOGRAPHY N/A 08/25/2021   Procedure: LEFT HEART CATH AND CORONARY ANGIOGRAPHY;  Surgeon: Jettie Booze, MD;  Location: [facility];  Service: Cardiovascular;  Laterality: N/A;   TONSILLECTOMY      ROS: Review of Systems Negative except as stated above  PHYSICAL EXAM: BP 132/77   Pulse 69   Temp 98.1 F (36.7 C) (Oral)   Resp 16   Wt 174 lb 12.8 oz (79.3 kg)   SpO2 100%   BMI 30.96 kg/m   Wt Readings from Last 3 Encounters:  10/31/21 174 lb 12.8 oz (79.3 kg)  09/07/21 178 lb  11.2 oz (81.1 kg)  08/25/21 175 lb 4.3 oz (79.5 kg)    Physical Exam  General appearance - alert, well appearing, and in no distress Mental status - normal mood, behavior, speech, dress, motor activity, and thought processes Neck - supple, no significant adenopathy Chest - clear to auscultation, no wheezes, rales or rhonchi, symmetric air entry Heart - normal rate, regular rhythm, normal S1, S2, no murmurs, rubs, clicks or gallops Extremities -no lower extremity edema.  Dorsalis pedis pulses and posterior tibialis pulses 3+ bilaterally.  Popliteal pulses 2+ bilaterally.  Legs are warm.      Latest Ref Rng & Units 08/26/2021    2:29 AM 08/25/2021    5:23 AM 08/24/2021    4:24 AM  CMP  Glucose 70 - 99 mg/dL 125   121   104    BUN 8 - 23 mg/dL '17   15   13    '$ Creatinine 0.44 - 1.00 mg/dL 1.11   0.92   0.81    Sodium 135 - 145 mmol/L 134   135   135    Potassium 3.5 - 5.1 mmol/L 3.7   3.7   3.6    Chloride 98 - 111 mmol/L 100   102   102    CO2 22 - 32 mmol/L '26   25   23    '$ Calcium 8.9 - 10.3 mg/dL 9.2   9.4   9.4     Lipid Panel     Component Value Date/Time   CHOL 163 09/20/2020 0854   TRIG 172 (H) 09/20/2020 0854   HDL 40 09/20/2020 0854   CHOLHDL 4.1 09/20/2020 0854   CHOLHDL 5.1 10/06/2016 0333   VLDL 31 10/06/2016 0333   LDLCALC 93 09/20/2020 0854    CBC    Component Value Date/Time   WBC 6.8  08/26/2021 0229   RBC 4.02 08/26/2021 0229   HGB 12.2 08/26/2021 0229   HGB 12.4 09/20/2020 0854   HCT 35.7 (L) 08/26/2021 0229   HCT 38.0 09/20/2020 0854   PLT 307 08/26/2021 0229   PLT 301 09/20/2020 0854   MCV 88.8 08/26/2021 0229   MCV 92 09/20/2020 0854   MCH 30.3 08/26/2021 0229   MCHC 34.2 08/26/2021 0229   RDW 12.8 08/26/2021 0229   RDW 13.3 09/20/2020 0854   LYMPHSABS 2.4 08/23/2021 0343   MONOABS 0.8 08/23/2021 0343   EOSABS 0.3 08/23/2021 0343   BASOSABS 0.0 08/23/2021 0343    ASSESSMENT AND PLAN:  1. Type 2 diabetes mellitus with other circulatory  complication, unspecified whether long term insulin use ([place]) Reported blood sugar readings before and after meals at goal.  She will continue Lantus 27 units daily, Farxiga, metformin and glipizide. Commended on regular exercise.  Encouraged her to keep up the good work.  Continue healthy eating habits. Reminded her to call and schedule eye exam and to have them send me information once she has had the eye exam done. - POCT glucose (manual entry)  2. Coronary artery disease involving native coronary artery of native heart without angina pectoris Stable on guideline directed medications including metoprolol, atorvastatin, and dual antiplatelet therapy.  3. Hypertension associated with diabetes ([place]) Close to goal today.  Reported home blood pressure readings have been good.  Continue current medications including Cozaar 100 mg, Norvasc 10 mg, HCTZ 25 mg and metoprolol 100 mg in the morning and 50 mg in the evening.  4. Claudication of both lower extremities ([place]) We will refer for ABI.  Advised patient to continue her walks.  She is already on antiplatelet therapy and statin therapy. - VAS [place] ABI WITH/WO TBI; Future  5.  Need for shingles vaccine Patient to get Shingrix at her outside pharmacy.  She will send me a MyChart message once she gets it so that I can update health maintenance.  Patient was given the opportunity to ask questions.  Patient verbalized understanding of the plan and was able to repeat key elements of the plan.   This documentation was completed using Radio producer.  Any transcriptional errors are unintentional.  Orders Placed This Encounter  Procedures   POCT glucose (manual entry)   VAS [place] ABI WITH/WO TBI     Requested Prescriptions    No prescriptions requested or ordered in this encounter    Return in about 4 months (around 03/02/2022).  Karle Plumber, MD, FACP

## 2021-11-01 ENCOUNTER — Telehealth: Payer: Self-pay | Admitting: *Deleted

## 2021-11-01 NOTE — Telephone Encounter (Signed)
Patient was called and given appointment for ABI at [facility]. Patient said she need to call and reschedule appt due to other commitment. Patient was told she can call and rescheduled for her preference. Patient understood.

## 2021-11-02 ENCOUNTER — Encounter: Payer: Self-pay | Admitting: Internal Medicine

## 2021-11-04 ENCOUNTER — Encounter (HOSPITAL_COMMUNITY): Payer: Medicare PPO

## 2021-11-08 ENCOUNTER — Ambulatory Visit (HOSPITAL_COMMUNITY)
Admission: RE | Admit: 2021-11-08 | Discharge: 2021-11-08 | Disposition: A | Discharge status: Home / Self Care | Payer: Medicare PPO | Source: Ambulatory Visit | Attending: Internal Medicine | Admitting: Internal Medicine

## 2021-11-08 DIAGNOSIS — I739 Peripheral vascular disease, unspecified: Secondary | ICD-10-CM | POA: Insufficient documentation

## 2021-11-09 ENCOUNTER — Encounter: Payer: Self-pay | Admitting: Internal Medicine

## 2021-11-09 DIAGNOSIS — I739 Peripheral vascular disease, unspecified: Secondary | ICD-10-CM | POA: Insufficient documentation

## 2021-11-14 ENCOUNTER — Other Ambulatory Visit: Payer: Self-pay

## 2021-11-18 ENCOUNTER — Telehealth: Payer: Self-pay

## 2021-11-18 NOTE — Telephone Encounter (Signed)
Jess from [facility] calling to get verification of correct dose on Lantus. Connected me with Loraine Leriche, [facility] who said they had 31 u and 27 u on file and needed to know which one to fill. I advised that 27 u QHS was most current rx. No further assistance needed

## 2021-12-12 ENCOUNTER — Other Ambulatory Visit: Payer: Self-pay | Admitting: *Deleted

## 2021-12-12 MED ORDER — METOPROLOL SUCCINATE ER 100 MG PO TB24
100.0000 mg | ORAL_TABLET | Freq: Every day | ORAL | 3 refills | Status: DC
Start: 2021-12-12 — End: 2022-08-14

## 2021-12-19 ENCOUNTER — Encounter (HOSPITAL_BASED_OUTPATIENT_CLINIC_OR_DEPARTMENT_OTHER): Payer: Self-pay | Admitting: Cardiology

## 2021-12-19 ENCOUNTER — Ambulatory Visit (HOSPITAL_BASED_OUTPATIENT_CLINIC_OR_DEPARTMENT_OTHER): Payer: Medicare PPO | Admitting: Cardiology

## 2021-12-19 VITALS — BP 130/62 | HR 75 | Ht 63.0 in | Wt 178.3 lb

## 2021-12-19 DIAGNOSIS — I252 Old myocardial infarction: Secondary | ICD-10-CM | POA: Diagnosis not present

## 2021-12-19 DIAGNOSIS — Z9861 Coronary angioplasty status: Secondary | ICD-10-CM | POA: Diagnosis not present

## 2021-12-19 DIAGNOSIS — Z79899 Other long term (current) drug therapy: Secondary | ICD-10-CM | POA: Diagnosis not present

## 2021-12-19 DIAGNOSIS — E782 Mixed hyperlipidemia: Secondary | ICD-10-CM | POA: Diagnosis not present

## 2021-12-19 DIAGNOSIS — I10 Essential (primary) hypertension: Secondary | ICD-10-CM | POA: Diagnosis not present

## 2021-12-19 DIAGNOSIS — E1159 Type 2 diabetes mellitus with other circulatory complications: Secondary | ICD-10-CM | POA: Diagnosis not present

## 2021-12-19 DIAGNOSIS — I2510 Atherosclerotic heart disease of native coronary artery without angina pectoris: Secondary | ICD-10-CM | POA: Diagnosis not present

## 2021-12-19 DIAGNOSIS — Z8673 Personal history of transient ischemic attack (TIA), and cerebral infarction without residual deficits: Secondary | ICD-10-CM | POA: Diagnosis not present

## 2021-12-19 DIAGNOSIS — E785 Hyperlipidemia, unspecified: Secondary | ICD-10-CM

## 2021-12-19 DIAGNOSIS — Z794 Long term (current) use of insulin: Secondary | ICD-10-CM | POA: Diagnosis not present

## 2021-12-19 NOTE — Patient Instructions (Signed)
Medication Instructions:  Your Physician recommend you continue on your current medication as directed.    *If you need a refill on your cardiac medications before your next appointment, please call your pharmacy*   Lab Work: Your provider has recommended lab work today ([place]. Please have this collected at [facility] [place]. The lab is open 8:00 am - 4:30 pm. Please avoid 12:00p - 1:00p for lunch hour. You do not need an appointment. Please go to [address] [place], [place] [address]. This is in the Primary Care office on the 3rd floor, let them know you are there for blood work and they will direct you to the lab.  If you have labs (blood work) drawn today and your tests are completely normal, you will receive your results only by: [place] (if you have MyChart) OR A paper copy in the mail If you have any lab test that is abnormal or we need to change your treatment, we will call you to review the results.   Testing/Procedures: None ordered today   Follow-Up: At [facility], you and your health needs are our priority.  As part of our continuing mission to provide you with exceptional heart care, we have created designated Provider Care Teams.  These Care Teams include your primary Cardiologist (physician) and Advanced Practice Providers (APPs -  Physician Assistants and Nurse Practitioners) who all work together to provide you with the care you need, when you need it.  We recommend signing up for the patient portal called "MyChart".  Sign up information is provided on this After Visit Summary.  MyChart is used to connect with patients for Virtual Visits (Telemedicine).  Patients are able to view lab/test results, encounter notes, upcoming appointments, etc.  Non-urgent messages can be sent to your provider as well.   To learn more about what you can do with MyChart, go to [email or web address].    Your next appointment:   6 month(s)  The  format for your next appointment:   In Person  Provider:   Buford Dresser, MD{

## 2021-12-19 NOTE — Progress Notes (Signed)
Cardiology Office Note:    Date:  12/19/2021   ID:  [name], DOB 1956-05-05, MRN [id number]  PCP:  [name], MD  Cardiologist:  Buford Dresser, MD  Referring MD: [name], MD   CC: Establish care  History of Present Illness:    Breanna Obrien is a 66 y.o. female with a hx of CAD s/p DES to LAD x 2 ( 2008 in [place]) and NSTEMI s/p mid RCA (2023), CVA (09/2016), Bell's palsy, bilateral carotid artery stenosis, hypertension, diabetes type II, who is here for follow up today.  On 09/07/21 she was seen by Ambrose Pancoast, NP for post hospital follow-up of CAD s/p NSTEMI. She had presented to the ED 08/23/21 and was found to have NSTEMI vs ACS; she was transferred to [facility]. High sensitivity troponins were 18>>48>>39>>26 and EKG showed ST and minimal ST depression. Left heart cath was completed and revealed 90% stenosed mid RCA that was treated with DES x1. Patient was discharged on DAPT with Brilinta and ASA 81 mg, and long-term monitor to evaluate for palpitations. At her appointment with Ambrose Pancoast, NP she noted increased palpitations at rest and with activity. Her EKG showed sinus rhythm and nonspecific T wave abnormality, with rate of 95- no acute changes. Atenolol was switched to Toprol 100 mg QD. She was cleared for cardiac rehab.  On 09/21/21 she messaged the office and provided a blood pressure log, which was personally reviewed.  Today, she is accompanied by her husband. Overall, she states she has been feeling okay. However, she does not sleep well. This has been worse and stressful with her CPAP machine. She may remain awake for half of the night, and also gets up frequently to use the restroom. She has not tried using a full mask, but she is wary of this as she would likely feel claustrophobic.  Cardiovascular risk factors: Prior clinical ASCVD: CAD s/p DES to LAD x 2 ( 2008 in [place]) and NSTEMI s/p mid RCA (07/2021), CVA (09/2016). Comorbid conditions:  Recently her blood pressure has been improving at home. Although, in the afternoons from 3-6 PM she has noticed higher readings, sometimes in the 140's. Prior cardiac testing and/or incidental findings on other testing (ie coronary calcium): Exercise level: Lately she is doing well exercising 4-5 days a week for 15 minutes. Also will care for her 76 month old grandson. Current diet: At times she has noticed elevated heart rates when her blood sugar is low. She drinks coffee with creamer and splenda.  She denies any palpitations, chest pain, shortness of breath, or peripheral edema. No lightheadedness, headaches, syncope, orthopnea, or PND.  Past Medical History:  Diagnosis Date   Allergy    Bell's palsy    CAD (coronary artery disease)    Diabetes mellitus without complication ([place])    Gallstones    Heart disease    HLD (hyperlipidemia)    Hypertension    Mini stroke    TIA (transient ischemic attack) 06/30/2016    Past Surgical History:  Procedure Laterality Date   CHOLECYSTECTOMY     CORONARY ANGIOPLASTY WITH STENT PLACEMENT     CORONARY STENT INTERVENTION N/A 08/25/2021   Procedure: CORONARY STENT INTERVENTION;  Surgeon: Jettie Booze, MD;  Location: [facility];  Service: Cardiovascular;  Laterality: N/A;   INTRAVASCULAR ULTRASOUND/IVUS N/A 08/25/2021   Procedure: Intravascular Ultrasound/IVUS;  Surgeon: Jettie Booze, MD;  Location: [facility];  Service: Cardiovascular;  Laterality: N/A;  LEFT HEART CATH AND CORONARY ANGIOGRAPHY N/A 08/25/2021   Procedure: LEFT HEART CATH AND CORONARY ANGIOGRAPHY;  Surgeon: Jettie Booze, MD;  Location: [facility];  Service: Cardiovascular;  Laterality: N/A;   TONSILLECTOMY      Current Medications: Current Outpatient Medications on File Prior to Visit  Medication Sig   ALPHA LIPOIC ACID PO Take 1 tablet by mouth daily. With green tea   amLODipine (NORVASC) 10 MG tablet TAKE 1 TABLET (10 MG TOTAL)  BY MOUTH DAILY.   Ascorbic Acid (VITAMIN C PO) Take 1 tablet by mouth daily.   aspirin 81 MG chewable tablet Chew 1 tablet (81 mg total) by mouth daily.   atorvastatin (LIPITOR) 40 MG tablet Take 1 tablet (40 mg total) by mouth at bedtime.   calcium-vitamin D (OSCAL WITH D) 500-200 MG-UNIT tablet Take 1 tablet by mouth.   CHROMIUM PO Take 1 tablet by mouth daily.   dapagliflozin propanediol (FARXIGA) 10 MG TABS tablet Take 1 tablet (10 mg total) by mouth daily.   Fish Oil-Cholecalciferol (FISH OIL + D3 PO) Take 1 tablet by mouth daily.   glipiZIDE (GLUCOTROL) 10 MG tablet TAKE 1 TABLET BY MOUTH TWICE DAILY BEFORE A MEAL   hydrochlorothiazide (HYDRODIURIL) 25 MG tablet Take 1 tablet (25 mg total) by mouth daily.   insulin glargine (LANTUS) 100 UNIT/ML injection Inject 0.27 mLs (27 Units total) into the skin at bedtime.   Insulin Syringe-Needle U-100 (TRUEPLUS INSULIN SYRINGE) 30G X 5/16" 0.5 ML MISC UAD   losartan (COZAAR) 100 MG tablet TAKE 1 TABLET (100 MG TOTAL) BY MOUTH DAILY.   MAGNESIUM PO Take 1 tablet by mouth daily.   metFORMIN (GLUCOPHAGE) 1000 MG tablet TAKE 1 TABLET (1,000 MG TOTAL) BY MOUTH 2 (TWO) TIMES DAILY.   metoprolol succinate (TOPROL-XL) 100 MG 24 hr tablet Take 1 tablet (100 mg total) by mouth daily. Take with or immediately following a meal.   metoprolol succinate (TOPROL-XL) 50 MG 24 hr tablet Take 1 tablet (50 mg total) by mouth daily. Take with or immediately following a meal, take in the evening.   Multiple Vitamin (MULTIVITAMIN PO) Take 1 tablet by mouth daily.   nitroGLYCERIN (NITROSTAT) 0.4 MG SL tablet Place 1 tablet (0.4 mg total) under the tongue every 5 (five) minutes x 3 doses as needed for chest pain.   ticagrelor (BRILINTA) 90 MG TABS tablet Take 1 tablet (90 mg total) by mouth 2 (two) times daily.   No current facility-administered medications on file prior to visit.     Allergies:   Trazodone and nefazodone   Social History   Tobacco Use   Smoking  status: Former    Types: Cigarettes    Quit date: 1996    Years since quitting: 27.5   Smokeless tobacco: Never  Vaping Use   Vaping Use: Never used  Substance Use Topics   Alcohol use: No   Drug use: Never    Family History: family history includes Breast cancer in her maternal aunt; Diabetes in her brother and mother; HIV/AIDS in her brother; Heart attack in her father; Hypertension in her brother and brother; Liver disease in her brother; Pancreatic cancer in her mother. There is no history of Rectal cancer, Colon cancer, or Esophageal cancer.  ROS:   Please see the history of present illness.  Additional pertinent ROS: Constitutional: Negative for chills, fever, night sweats, unintentional weight loss. Positive for insomnia, stress.  HENT: Negative for ear pain and hearing loss.   Eyes:  Negative for loss of vision and eye pain.  Respiratory: Negative for cough, sputum, wheezing.   Cardiovascular: See HPI. Gastrointestinal: Negative for abdominal pain, melena, and hematochezia.  Genitourinary: Negative for dysuria and hematuria. Positive for frequent nocturia. Musculoskeletal: Negative for falls and myalgias.  Skin: Negative for itching and rash.  Neurological: Negative for focal weakness, focal sensory changes and loss of consciousness.  Endo/Heme/Allergies: Does not bruise/bleed easily.     EKGs/Labs/Other Studies Reviewed:    The following studies were reviewed today:  ABI Doppler  11/08/2021: Summary:  Right: Resting right ankle-brachial index indicates mild right lower  extremity arterial disease. The right toe-brachial index is abnormal.   Left: Resting left ankle-brachial index is within normal range. No  evidence of significant left lower extremity arterial disease. The left  toe-brachial index is normal.   Monitor  09/2021: The basic underlying rhythm is normal sinus rhythm. Average heart rate 66 bpm. Frequent sinus tachycardia unprovoked by physical activity.  Maximal heart rate 145 bpm and associated with sensation of palpitations. Mobitz 1 second-degree heart block during sleep noted on 1 occasion. Supraventricular ectopic burden 1.8%. Ventricular ectopic burden less than 1%. No PSVT or atrial fibrillation noted.     Patch Wear Time:  14 days and 0 hours (2023-04-04T10:20:55-0400 to 2023-04-18T10:20:59-0400)   Patient had a min HR of 66 bpm, max HR of 145 bpm, and avg HR of 98 bpm. Predominant underlying rhythm was Sinus Rhythm. First Degree AV Block was present. Isolated SVEs were occasional (1.8%, 34453), SVE Triplets were rare (<1.0%, 156), and no SVE  Couplets were present. Isolated VEs were rare (<1.0%), VE Couplets were rare (<1.0%), and no VE Triplets were present. Ventricular Bigeminy and Trigeminy were present.  Left Heart Catheterization  08/25/2021:   Dist LM lesion is 25% stenosed.   Previously placed Mid LAD stent (unknown type) is  widely patent.   Mid RCA lesion is 90% stenosed.  In-stent restenosis from the 2008 Taxus stent.Scoring balloon angioplasty was performed using a BALLN SCOREFLEX 2.50X10.  Postdilatation with a 3.25 [place] balloon.   Post intervention, there is a 0% residual stenosis.   Prox RCA-2 lesion is 75% stenosed.  Prox RCA-1 lesion is 80% stenosed.   A drug-eluting stent was successfully placed using a STENT ONYX FRONTIER 3.0X18. A drug-eluting stent was successfully placed using a STENT ONYX FRONTIER 3.0X12 x two.  Stented segment was optimized with intravascular ultrasound.   Post intervention, there is a 0% residual stenosis.   Continue aggressive secondary prevention.  Would continue dual antiplatelet therapy for at least 12 months.  If she did not have bleeding issues, would continue clopidogrel monotherapy long-term.  Diagnostic: Dominance: Right   Intervention:   Echocardiogram  08/24/2021:  1. Left ventricular ejection fraction, by estimation, is 55%. The left  ventricle has normal function. The left  ventricle has no regional wall  motion abnormalities. Diastolic function lower limit of normal for age.   2. Right ventricular systolic function is normal. The right ventricular  size is normal. There is normal pulmonary artery systolic pressure. The  estimated right ventricular systolic pressure is 78.2 mmHg.   3. The mitral valve is normal in structure. Trivial mitral valve  regurgitation. No evidence of mitral stenosis.   4. The aortic valve is grossly normal. Aortic valve regurgitation is not  visualized. No aortic stenosis is present.   5. The inferior vena cava is normal in size with greater than 50%  respiratory variability, suggesting right atrial pressure of  3 mmHg.    EKG:  EKG is personally reviewed.   12/19/2021: not ordered today  Recent Labs: 08/23/2021: Magnesium 1.6; TSH 1.921 08/26/2021: BUN 17; Creatinine, Ser 1.11; Hemoglobin 12.2; Platelets 307; Potassium 3.7; Sodium 134   Recent Lipid Panel    Component Value Date/Time   CHOL 163 09/20/2020 0854   TRIG 172 (H) 09/20/2020 0854   HDL 40 09/20/2020 0854   CHOLHDL 4.1 09/20/2020 0854   CHOLHDL 5.1 10/06/2016 0333   VLDL 31 10/06/2016 0333   LDLCALC 93 09/20/2020 0854    Physical Exam:    VS:  BP 130/62 (BP Location: Right Arm, Patient Position: Sitting, Cuff Size: Large)   Pulse 75   Ht '5\' 3"'$  (1.6 m)   Wt 178 lb 4.8 oz (80.9 kg)   SpO2 97%   BMI 31.58 kg/m     Wt Readings from Last 3 Encounters:  12/19/21 178 lb 4.8 oz (80.9 kg)  10/31/21 174 lb 12.8 oz (79.3 kg)  09/07/21 178 lb 11.2 oz (81.1 kg)    GEN: Well nourished, well developed in no acute distress HEENT: Normal, moist mucous membranes NECK: No JVD CARDIAC: regular rhythm, normal S1 and S2, no rubs or gallops. No murmur. VASCULAR: Radial and DP pulses 2+ bilaterally. No carotid bruits RESPIRATORY:  Clear to auscultation without rales, wheezing or rhonchi  ABDOMEN: Soft, non-tender, non-distended MUSCULOSKELETAL:  Ambulates  independently SKIN: Warm and dry, no edema. NEUROLOGIC:  Alert and oriented x 3. No focal neuro deficits noted. PSYCHIATRIC:  Normal affect    ASSESSMENT:    1. CAD S/P percutaneous coronary angioplasty   2. Essential hypertension   3. Mixed hyperlipidemia   4. History of CVA (cerebrovascular accident)   5. Medication management   6. Type 2 diabetes mellitus with other circulatory complication, with long-term current use of insulin ([place])   7. History of non-ST elevation myocardial infarction (NSTEMI)    PLAN:    CAD s/p PCI History of NSTEMI History of CVA Mixed hyperlipidemia -continue aspirin and ticagrelor. Has stent in stent -continue atorvastatin. Update lipids. Would aim for aggressive control with LDL <55 -continue metoprolol  Hypertension -at goal today -continue amlodipine, HCTZ, metoprolol  Type II diabetes -on metformin, insulin, glipizide -on dapagliflozin  Cardiac risk counseling and prevention recommendations: -recommend heart healthy/Mediterranean diet, with whole grains, fruits, vegetable, fish, lean meats, nuts, and olive oil. Limit salt. -recommend moderate walking, 3-5 times/week for 30-50 minutes each session. Aim for at least 150 minutes.week. Goal should be pace of 3 miles/hours, or walking 1.5 miles in 30 minutes -recommend avoidance of tobacco products. Avoid excess alcohol.  Plan for follow up: 6 months or sooner as needed.  Buford Dresser, MD, PhD, [facility]    Medication Adjustments/Labs and Tests Ordered: Current medicines are reviewed at length with the patient today.  Concerns regarding medicines are outlined above.   Orders Placed This Encounter  Procedures   Lipid panel   No orders of the defined types were placed in this encounter.  Patient Instructions  Medication Instructions:  Your Physician recommend you continue on your current medication as directed.    *If you need a refill on your cardiac  medications before your next appointment, please call your pharmacy*   Lab Work: Your provider has recommended lab work today ([place]. Please have this collected at [facility] [place]. The lab is open 8:00 am - 4:30 pm. Please avoid 12:00p - 1:00p for lunch hour. You  do not need an appointment. Please go to [address] [place], [place] [address]. This is in the Primary Care office on the 3rd floor, let them know you are there for blood work and they will direct you to the lab.  If you have labs (blood work) drawn today and your tests are completely normal, you will receive your results only by: [place] (if you have MyChart) OR A paper copy in the mail If you have any lab test that is abnormal or we need to change your treatment, we will call you to review the results.   Testing/Procedures: None ordered today   Follow-Up: At [facility], you and your health needs are our priority.  As part of our continuing mission to provide you with exceptional heart care, we have created designated Provider Care Teams.  These Care Teams include your primary Cardiologist (physician) and Advanced Practice Providers (APPs -  Physician Assistants and Nurse Practitioners) who all work together to provide you with the care you need, when you need it.  We recommend signing up for the patient portal called "MyChart".  Sign up information is provided on this After Visit Summary.  MyChart is used to connect with patients for Virtual Visits (Telemedicine).  Patients are able to view lab/test results, encounter notes, upcoming appointments, etc.  Non-urgent messages can be sent to your provider as well.   To learn more about what you can do with MyChart, go to [email or web address].    Your next appointment:   6 month(s)  The format for your next appointment:   In Person  Provider:   Buford Dresser, MD{        I,Mathew Stumpf,acting as a scribe for  Buford Dresser, MD.,have documented all relevant documentation on the behalf of Buford Dresser, MD,as directed by  Buford Dresser, MD while in the presence of Buford Dresser, MD.  I, Buford Dresser, MD, have reviewed all documentation for this visit. The documentation on 01/22/22 for the exam, diagnosis, procedures, and orders are all accurate and complete.   Signed, Buford Dresser, MD PhD 12/19/2021 3:39 PM    [place]

## 2021-12-20 LAB — LIPID PANEL
Chol/HDL Ratio: 3 ratio (ref 0.0–4.4)
Cholesterol, Total: 130 mg/dL (ref 100–199)
HDL: 43 mg/dL (ref >39)
LDL Chol Calc (NIH): 67 mg/dL (ref 0–99)
Triglycerides: 111 mg/dL (ref 0–149)
VLDL Cholesterol Cal: 20 mg/dL (ref 5–40)

## 2021-12-31 ENCOUNTER — Encounter: Payer: Self-pay | Admitting: Internal Medicine

## 2022-01-01 ENCOUNTER — Other Ambulatory Visit: Payer: Self-pay | Admitting: Internal Medicine

## 2022-01-01 MED ORDER — GLIPIZIDE 10 MG PO TABS: ORAL_TABLET | Freq: Two times a day (BID) | ORAL | 3 refills | Status: DC

## 2022-01-31 ENCOUNTER — Encounter (HOSPITAL_BASED_OUTPATIENT_CLINIC_OR_DEPARTMENT_OTHER): Payer: Self-pay

## 2022-01-31 ENCOUNTER — Other Ambulatory Visit: Payer: Self-pay

## 2022-01-31 ENCOUNTER — Other Ambulatory Visit (HOSPITAL_BASED_OUTPATIENT_CLINIC_OR_DEPARTMENT_OTHER): Payer: Self-pay

## 2022-01-31 DIAGNOSIS — Z79899 Other long term (current) drug therapy: Secondary | ICD-10-CM

## 2022-01-31 MED ORDER — EZETIMIBE 10 MG PO TABS
10.0000 mg | ORAL_TABLET | Freq: Every day | ORAL | 3 refills | Status: DC
  Filled 2022-01-31: qty 90, 90d supply, fill #0

## 2022-01-31 MED ORDER — EZETIMIBE 10 MG PO TABS: 10.0000 mg | ORAL_TABLET | Freq: Every day | ORAL | 3 refills | Status: DC

## 2022-02-15 DIAGNOSIS — G4733 Obstructive sleep apnea (adult) (pediatric): Secondary | ICD-10-CM

## 2022-02-20 DIAGNOSIS — E119 Type 2 diabetes mellitus without complications: Secondary | ICD-10-CM | POA: Diagnosis not present

## 2022-02-20 DIAGNOSIS — H2513 Age-related nuclear cataract, bilateral: Secondary | ICD-10-CM | POA: Diagnosis not present

## 2022-03-02 ENCOUNTER — Encounter: Payer: Self-pay | Admitting: Internal Medicine

## 2022-03-02 ENCOUNTER — Ambulatory Visit: Payer: Medicare PPO | Attending: Internal Medicine | Admitting: Internal Medicine

## 2022-03-02 VITALS — BP 131/79 | HR 77 | Ht 63.0 in | Wt 177.3 lb

## 2022-03-02 DIAGNOSIS — E785 Hyperlipidemia, unspecified: Secondary | ICD-10-CM | POA: Diagnosis not present

## 2022-03-02 DIAGNOSIS — I739 Peripheral vascular disease, unspecified: Secondary | ICD-10-CM | POA: Diagnosis not present

## 2022-03-02 DIAGNOSIS — E1169 Type 2 diabetes mellitus with other specified complication: Secondary | ICD-10-CM

## 2022-03-02 DIAGNOSIS — E1159 Type 2 diabetes mellitus with other circulatory complications: Secondary | ICD-10-CM

## 2022-03-02 DIAGNOSIS — I2510 Atherosclerotic heart disease of native coronary artery without angina pectoris: Secondary | ICD-10-CM

## 2022-03-02 DIAGNOSIS — I152 Hypertension secondary to endocrine disorders: Secondary | ICD-10-CM | POA: Diagnosis not present

## 2022-03-02 LAB — POCT GLYCOSYLATED HEMOGLOBIN (HGB A1C): HbA1c, POC (controlled diabetic range): 6.7 % (ref 0.0–7.0)

## 2022-03-02 LAB — GLUCOSE, POCT (MANUAL RESULT ENTRY): POC Glucose: 129 mg/dl — AB (ref 70–99)

## 2022-03-02 MED ORDER — AMLODIPINE BESYLATE 10 MG PO TABS: ORAL_TABLET | Freq: Every day | ORAL | 3 refills | Status: DC

## 2022-03-02 MED ORDER — ATORVASTATIN CALCIUM 40 MG PO TABS: 40.0000 mg | ORAL_TABLET | Freq: Every day | ORAL | 3 refills | Status: DC

## 2022-03-02 MED ORDER — METFORMIN HCL 1000 MG PO TABS: ORAL_TABLET | Freq: Two times a day (BID) | ORAL | 3 refills | Status: DC

## 2022-03-02 MED ORDER — HYDROCHLOROTHIAZIDE 25 MG PO TABS: 25.0000 mg | ORAL_TABLET | Freq: Every day | ORAL | 3 refills | Status: DC

## 2022-03-02 MED ORDER — LOSARTAN POTASSIUM 100 MG PO TABS: ORAL_TABLET | Freq: Every day | ORAL | 3 refills | Status: DC

## 2022-03-02 MED ORDER — GLIPIZIDE 10 MG PO TABS: 5.0000 mg | ORAL_TABLET | Freq: Two times a day (BID) | ORAL | 3 refills | Status: DC

## 2022-03-02 NOTE — Progress Notes (Signed)
Patient ID: Breanna Obrien, female    DOB: 01-29-1956  MRN: [id number]  CC: Diabetes   Subjective: Breanna Obrien is a 66 y.o. female who presents for chronic ds management Her concerns today include:  left pontine CVA (09/2016), HTN, HL, diabetes type 2, BL carotid artery stenosis, and hx of CAD (stent x 2 per pt's old medical records, 3 stents to RCA 07/2021).  HYPERTENSION/CAD Currently taking: see medication list.  Medications include amlodipine 10 mg daily, Cozaar 100 mg daily, Toprol 100 mg daily and HCTZ 25 mg daily.  She is also on atorvastatin, Zetia, aspirin and Brilinta Med Adherence: '[x]'$  Yes    '[]'$  No Medication side effects: '[]'$  Yes    '[x]'$  No Adherence with salt restriction: '[x]'$  Yes    '[]'$  No Home Monitoring?: '[x]'$  Yes    '[]'$  No Monitoring Frequency: daily Home BP results range:  this a.m 131/74.  Always good.  Sometimes high in the evenings SOB? '[]'$  Yes    '[x]'$  No Chest Pain?: '[]'$  Yes    '[x]'$  No Leg swelling?: '[]'$  Yes    '[x]'$  No Headaches?: '[]'$  Yes    '[x]'$  No Dizziness? '[]'$  Yes    '[x]'$  No Comments: She saw her cardiologist Dr. Harrell Gave in July.  Patient was stable at that time.  Plan was to continue medications including atorvastatin, aspirin and Brilinta. Walking 5 days a wk 20-30 mins.   Some days she has to do 15 mins in a.m and 15 mins p.m if she gets pain RT calf.  ABI showed abn toe-BI on RT side    DIABETES TYPE 2 Last A1C:   Results for orders placed or performed in visit on 03/02/22  POCT glucose (manual entry)  Result Value Ref Range   POC Glucose 129 (A) 70 - 99 mg/dl   A1C Med Adherence:  '[x]'$  Yes.  She is on Lantus 27 units daily, glipizide 10 mg twice a day, metformin 1 g twice a day, Farxiga.  Wants to know if one of DM med can be stopped or lowered.  Medication side effects:  '[]'$  Yes    '[x]'$  No Home Monitoring?  '[x]'$  Yes    '[]'$  No Home glucose results range: checking 3-5x a day.  Average 14 days 144, 30 days it was 139 Diet Adherence: '[]'$  Yes    '[]'$  No Exercise:  '[]'$  Yes    '[]'$  No Hypoglycemic episodes?: '[x]'$  Yes -BS down to 47 the middle of the night last wk.  Resolved with some OJ.  Occurs about 2x/wk in middle of night and sometimes during the day if she eats a light lunch.   Numbness of the feet? '[]'$  Yes    '[]'$  No Retinopathy hx? '[]'$  Yes    '[x]'$  No Last eye exam: 02/20/2022 at [facility] Dr. Austin Miles.  No retinopathy, found to have age-related cataracts Comments:   HM:  had flu shot at [facility] on 02/06/2022.   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 11/12/2020   Abnormal mammogram 01/15/2017   CAD S/P percutaneous coronary angioplasty 01/15/2017   Hx of Bell's palsy 01/15/2017   Insomnia due to medical condition 10/24/2016   Depression 10/24/2016  Type 2 diabetes mellitus with complication, without long-term current use of insulin ([place]) 10/06/2016   Essential hypertension 10/06/2016   Ataxia    Hyperlipidemia    Bilateral carotid artery stenosis    CVA (cerebral vascular accident) ([place]) 10/05/2016   TIA (transient ischemic attack) 06/30/2016     Current Outpatient Medications on File Prior to Visit  Medication Sig Dispense Refill   ALPHA LIPOIC ACID PO Take 1 tablet by mouth daily. With green tea     amLODipine (NORVASC) 10 MG tablet TAKE 1 TABLET (10 MG TOTAL) BY MOUTH DAILY. 90 tablet 3   Ascorbic Acid (VITAMIN C PO) Take 1 tablet by mouth daily.     aspirin 81 MG chewable tablet Chew 1 tablet (81 mg total) by mouth daily.     atorvastatin (LIPITOR) 40 MG tablet Take 1 tablet (40 mg total) by mouth at bedtime. 90 tablet 3   calcium-vitamin D (OSCAL WITH D) 500-200 MG-UNIT tablet Take 1 tablet by  mouth.     CHROMIUM PO Take 1 tablet by mouth daily.     dapagliflozin propanediol (FARXIGA) 10 MG TABS tablet Take 1 tablet (10 mg total) by mouth daily. 90 tablet 3   ezetimibe (ZETIA) 10 MG tablet Take 1 tablet (10 mg total) by mouth daily. 90 tablet 3   Fish Oil-Cholecalciferol (FISH OIL + D3 PO) Take 1 tablet by mouth daily.     glipiZIDE (GLUCOTROL) 10 MG tablet TAKE 1 TABLET BY MOUTH TWICE DAILY BEFORE A MEAL 180 tablet 3   hydrochlorothiazide (HYDRODIURIL) 25 MG tablet Take 1 tablet (25 mg total) by mouth daily. 90 tablet 3   insulin glargine (LANTUS) 100 UNIT/ML injection Inject 0.27 mLs (27 Units total) into the skin at bedtime. 30 mL 11   Insulin Syringe-Needle U-100 (TRUEPLUS INSULIN SYRINGE) 30G X 5/16" 0.5 ML MISC UAD 100 each 11   losartan (COZAAR) 100 MG tablet TAKE 1 TABLET (100 MG TOTAL) BY MOUTH DAILY. 90 tablet 3   MAGNESIUM PO Take 1 tablet by mouth daily.     metFORMIN (GLUCOPHAGE) 1000 MG tablet TAKE 1 TABLET (1,000 MG TOTAL) BY MOUTH 2 (TWO) TIMES DAILY. 180 tablet 3   metoprolol succinate (TOPROL-XL) 100 MG 24 hr tablet Take 1 tablet (100 mg total) by mouth daily. Take with or immediately following a meal. 90 tablet 3   metoprolol succinate (TOPROL-XL) 50 MG 24 hr tablet Take 1 tablet (50 mg total) by mouth daily. Take with or immediately following a meal, take in the evening. 90 tablet 3   Multiple Vitamin (MULTIVITAMIN PO) Take 1 tablet by mouth daily.     nitroGLYCERIN (NITROSTAT) 0.4 MG SL tablet Place 1 tablet (0.4 mg total) under the tongue every 5 (five) minutes x 3 doses as needed for chest pain. 25 tablet 1   ticagrelor (BRILINTA) 90 MG TABS tablet Take 1 tablet (90 mg total) by mouth 2 (two) times daily. 60 tablet 1   No current facility-administered medications on file prior to visit.    Allergies  Allergen Reactions   Trazodone And Nefazodone Other (See Comments)    Makes her feel jittery    Social History   Socioeconomic History   Marital status:  Married    Spouse name: Not on file   Number of children: 1   Years of education: Not on file   Highest education level: Not on file  Occupational History   Occupation: retired  Tobacco Use   Smoking status: Former  Types: Cigarettes    Quit date: 49    Years since quitting: 27.7   Smokeless tobacco: Never  Vaping Use   Vaping Use: Never used  Substance and Sexual Activity   Alcohol use: No   Drug use: Never   Sexual activity: Yes    Birth control/protection: None, Post-menopausal  Other Topics Concern   Not on file  Social History Narrative   Not on file   Social Determinants of Health   Financial Resource Strain: Not on file  Food Insecurity: No Food Insecurity (11/18/2020)   Hunger Vital Sign    Worried About Running Out of Food in the Last Year: Never true    Ran Out of Food in the Last Year: Never true  Transportation Needs: No Transportation Needs (11/18/2020)   PRAPARE - Hydrologist (Medical): No    Lack of Transportation (Non-Medical): No  Physical Activity: Not on file  Stress: Not on file  Social Connections: Not on file  Intimate Partner Violence: Not on file    Family History  Problem Relation Age of Onset   Pancreatic cancer Mother    Diabetes Mother    Heart attack Father    Diabetes Brother    Liver disease Brother        drinker   Hypertension Brother    Hypertension Brother    HIV/AIDS Brother    Breast cancer Maternal Aunt    Rectal cancer Neg Hx    Colon cancer Neg Hx    Esophageal cancer Neg Hx     Past Surgical History:  Procedure Laterality Date   CHOLECYSTECTOMY     CORONARY ANGIOPLASTY WITH STENT PLACEMENT     CORONARY STENT INTERVENTION N/A 08/25/2021   Procedure: CORONARY STENT INTERVENTION;  Surgeon: Jettie Booze, MD;  Location: [facility] [facility];  Service: Cardiovascular;  Laterality: N/A;   INTRAVASCULAR ULTRASOUND/IVUS N/A 08/25/2021   Procedure: Intravascular Ultrasound/IVUS;   Surgeon: Jettie Booze, MD;  Location: [place];  Service: Cardiovascular;  Laterality: N/A;   LEFT HEART CATH AND CORONARY ANGIOGRAPHY N/A 08/25/2021   Procedure: LEFT HEART CATH AND CORONARY ANGIOGRAPHY;  Surgeon: Jettie Booze, MD;  Location: [facility];  Service: Cardiovascular;  Laterality: N/A;   TONSILLECTOMY      ROS: Review of Systems Negative except as stated above  PHYSICAL EXAM: BP 131/79   Pulse 77   Ht '5\' 3"'$  (1.6 m)   Wt 177 lb 4.8 oz (80.4 kg)   SpO2 97%   BMI 31.41 kg/m   Wt Readings from Last 3 Encounters:  03/02/22 177 lb 4.8 oz (80.4 kg)  12/19/21 178 lb 4.8 oz (80.9 kg)  10/31/21 174 lb 12.8 oz (79.3 kg)    Physical Exam  General appearance - alert, well appearing, older female and in no distress Mental status - normal mood, behavior, speech, dress, motor activity, and thought processes Neck - supple, no significant adenopathy Chest - clear to auscultation, no wheezes, rales or rhonchi, symmetric air entry Heart - normal rate, regular rhythm, normal S1, S2, no murmurs, rubs, clicks or gallops Extremities - no pedal edema, no clubbing or cyanosis.  Dorsalis pedis pulse on the right is 2+, on the left is 3+.      Latest Ref Rng & Units 08/26/2021    2:29 AM 08/25/2021    5:23 AM 08/24/2021    4:24 AM  CMP  Glucose 70 - 99 mg/dL 125  121  104   BUN 8 - 23 mg/dL '17  15  13   '$ Creatinine 0.44 - 1.00 mg/dL 1.11  0.92  0.81   Sodium 135 - 145 mmol/L 134  135  135   Potassium 3.5 - 5.1 mmol/L 3.7  3.7  3.6   Chloride 98 - 111 mmol/L 100  102  102   CO2 22 - 32 mmol/L '26  25  23   '$ Calcium 8.9 - 10.3 mg/dL 9.2  9.4  9.4    Lipid Panel     Component Value Date/Time   CHOL 130 12/19/2021 1040   TRIG 111 12/19/2021 1040   HDL 43 12/19/2021 1040   CHOLHDL 3.0 12/19/2021 1040   CHOLHDL 5.1 10/06/2016 0333   VLDL 31 10/06/2016 0333   LDLCALC 67 12/19/2021 1040    CBC    Component Value Date/Time   WBC 6.8 08/26/2021 0229    RBC 4.02 08/26/2021 0229   HGB 12.2 08/26/2021 0229   HGB 12.4 09/20/2020 0854   HCT 35.7 (L) 08/26/2021 0229   HCT 38.0 09/20/2020 0854   PLT 307 08/26/2021 0229   PLT 301 09/20/2020 0854   MCV 88.8 08/26/2021 0229   MCV 92 09/20/2020 0854   MCH 30.3 08/26/2021 0229   MCHC 34.2 08/26/2021 0229   RDW 12.8 08/26/2021 0229   RDW 13.3 09/20/2020 0854   LYMPHSABS 2.4 08/23/2021 0343   MONOABS 0.8 08/23/2021 0343   EOSABS 0.3 08/23/2021 0343   BASOSABS 0.0 08/23/2021 0343    ASSESSMENT AND PLAN: 1. Type 2 diabetes mellitus with other circulatory complication, unspecified whether long term insulin use ([place]) Diabetes is controlled.  Commended her on this.  She has been experiencing some hypoglycemic episodes in the middle of the night.  I recommend decreasing Glucotrol from 10 mg twice a day to 5 mg twice a day.  She will continue Lantus 27 units daily, metformin and Farxiga - POCT glucose (manual entry) - POCT glycosylated hemoglobin (Hb A1C) - Microalbumin / creatinine urine ratio - metFORMIN (GLUCOPHAGE) 1000 MG tablet; TAKE 1 TABLET (1,000 MG TOTAL) BY MOUTH 2 (TWO) TIMES DAILY.  Dispense: 180 tablet; Refill: 3 - glipiZIDE (GLUCOTROL) 10 MG tablet; Take 0.5 tablets (5 mg total) by mouth 2 (two) times daily before a meal.  Dispense: 180 tablet; Refill: 3  2. Hypertension associated with diabetes ([place]) Very near goal.  Continue current medications and low-salt diet. - hydrochlorothiazide (HYDRODIURIL) 25 MG tablet; Take 1 tablet (25 mg total) by mouth daily.  Dispense: 90 tablet; Refill: 3 - amLODipine (NORVASC) 10 MG tablet; TAKE 1 TABLET (10 MG TOTAL) BY MOUTH DAILY.  Dispense: 90 tablet; Refill: 3 - losartan (COZAAR) 100 MG tablet; TAKE 1 TABLET (100 MG TOTAL) BY MOUTH DAILY.  Dispense: 90 tablet; Refill: 3  3. Hyperlipidemia associated with type 2 diabetes mellitus (HCC) Last LDL at goal.  Continue atorvastatin and Zetia. - atorvastatin (LIPITOR) 40 MG tablet; Take 1 tablet (40  mg total) by mouth at bedtime.  Dispense: 90 tablet; Refill: 3  4. Claudication of both lower extremities ([place]) Commended her on her walking routine and encouraged her to continue.  She is on statin therapy and aspirin.  5. Coronary artery disease involving native coronary artery of native heart without angina pectoris Followed by cardiology.  Stable on her medications including metoprolol, atorvastatin, Zetia, Brilinta and aspirin.     Patient was given the opportunity to ask questions.  Patient verbalized understanding of the plan and  was able to repeat key elements of the plan.   This documentation was completed using Radio producer.  Any transcriptional errors are unintentional.  Orders Placed This Encounter  Procedures   POCT glucose (manual entry)   POCT glycosylated hemoglobin (Hb A1C)     Requested Prescriptions    No prescriptions requested or ordered in this encounter    No follow-ups on file.  Karle Plumber, MD, FACP

## 2022-03-02 NOTE — Patient Instructions (Signed)
You should decrease the glipizide to 10 mg half a tablet twice a day.

## 2022-03-03 LAB — MICROALBUMIN / CREATININE URINE RATIO
Creatinine, Urine: 67.2 mg/dL
Microalb/Creat Ratio: 20 mg/g creat (ref 0–29)
Microalbumin, Urine: 13.2 ug/mL

## 2022-03-07 ENCOUNTER — Other Ambulatory Visit: Payer: Self-pay

## 2022-03-07 MED ORDER — EZETIMIBE 10 MG PO TABS: 10.0000 mg | ORAL_TABLET | Freq: Every day | ORAL | 0 refills | Status: DC

## 2022-03-09 NOTE — Progress Notes (Signed)
SLEEP MEDICINE VIRTUAL CONSULT NOTE via Video Note   Because of Breanna Obrien's co-morbid illnesses, she is at least at moderate risk for complications without adequate follow up.  This format is felt to be most appropriate for this patient at this time.  All issues noted in this document were discussed and addressed.  A limited physical exam was performed with this format.  Please refer to the patient's chart for her consent to telehealth for [facility].      Date:  03/10/2022   ID:  Breanna Obrien, DOB July 14, 1955, MRN [id number] The patient was identified using 2 identifiers.  Patient Location: Home Provider Location: Home Office   PCP:  [name], MD   [facility] Cardiologist:  Buford Dresser, MD     Evaluation Performed:  Consultation - Breanna Obrien was referred by Scarlette Slice, NP for the evaluation of OSA.  Chief Complaint:  OSA  History of Present Illness:    Breanna Obrien is a 66 y.o. female who is being seen today for the evaluation of OSA at the request of Ambrose Pancoast, NP.  Breanna Obrien is a 66 y.o. female with a hx of ASCAD, HTN, HLD, TIA, DM who saw Ambrose Pancoast, NP and complained of problems with  her husband complaining that she was snoring a lot and he noticed her stop breathing in her sleep.  She tells me that she would wake up gasping for breath at night in her sleep.  She had non restorative sleep but she does not nap during the day.  She has always  had a hard time sleeping.  She has problems getting to sleep and staying asleep at night.  If she gets up to go to the bathroom she cannot get back to sleep  because her husband is snoring.   She underwent home sleep study with a WatchPat 1 device which showed Mild OSA with an AHI of 8.7/hr and O2 sats as low as 84%.  She was started on auto CPAP from 4 -15cm H2O.  She is now referred for evaluation of OSA.  She has been struggling with her CPAP device.   She does not like the mask and finds that she takes it off a lot during the night without her knowledge.   She uses a nasal cushion mask which really bothers her.  She does not like anything on her face. Past Medical History:  Diagnosis Date   Allergy    Bell's palsy    CAD (coronary artery disease)    Diabetes mellitus without complication ([place])    Gallstones    Heart disease    HLD (hyperlipidemia)    Hypertension    Mini stroke    TIA (transient ischemic attack) 06/30/2016   Past Surgical History:  Procedure Laterality Date   CHOLECYSTECTOMY     CORONARY ANGIOPLASTY WITH STENT PLACEMENT     CORONARY STENT INTERVENTION N/A 08/25/2021   Procedure: CORONARY STENT INTERVENTION;  Surgeon: Jettie Booze, MD;  Location: [facility] [facility];  Service: Cardiovascular;  Laterality: N/A;   INTRAVASCULAR ULTRASOUND/IVUS N/A 08/25/2021   Procedure: Intravascular Ultrasound/IVUS;  Surgeon: Jettie Booze, MD;  Location: [facility];  Service: Cardiovascular;  Laterality: N/A;   LEFT HEART CATH AND CORONARY ANGIOGRAPHY N/A 08/25/2021   Procedure: LEFT HEART CATH AND CORONARY ANGIOGRAPHY;  Surgeon: Jettie Booze, MD;  Location: [facility];  Service: Cardiovascular;  Laterality: N/A;  TONSILLECTOMY       Current Meds  Medication Sig   ALPHA LIPOIC ACID PO Take 1 tablet by mouth daily. With green tea   amLODipine (NORVASC) 10 MG tablet TAKE 1 TABLET (10 MG TOTAL) BY MOUTH DAILY.   Ascorbic Acid (VITAMIN C PO) Take 1 tablet by mouth daily.   aspirin 81 MG chewable tablet Chew 1 tablet (81 mg total) by mouth daily.   atorvastatin (LIPITOR) 40 MG tablet Take 1 tablet (40 mg total) by mouth at bedtime.   calcium-vitamin D (OSCAL WITH D) 500-200 MG-UNIT tablet Take 1 tablet by mouth.   CHROMIUM PO Take 1 tablet by mouth daily.   dapagliflozin propanediol (FARXIGA) 10 MG TABS tablet Take 1 tablet (10 mg total) by mouth daily.   ezetimibe (ZETIA) 10 MG tablet Take 1  tablet (10 mg total) by mouth daily. Please keep schedule appointment with cardiologist   Fish Oil-Cholecalciferol (FISH OIL + D3 PO) Take 1 tablet by mouth daily.   glipiZIDE (GLUCOTROL) 10 MG tablet Take 0.5 tablets (5 mg total) by mouth 2 (two) times daily before a meal.   hydrochlorothiazide (HYDRODIURIL) 25 MG tablet Take 1 tablet (25 mg total) by mouth daily.   insulin glargine (LANTUS) 100 UNIT/ML injection Inject 0.27 mLs (27 Units total) into the skin at bedtime.   Insulin Syringe-Needle U-100 (TRUEPLUS INSULIN SYRINGE) 30G X 5/16" 0.5 ML MISC UAD   losartan (COZAAR) 100 MG tablet TAKE 1 TABLET (100 MG TOTAL) BY MOUTH DAILY.   MAGNESIUM PO Take 1 tablet by mouth daily.   metFORMIN (GLUCOPHAGE) 1000 MG tablet TAKE 1 TABLET (1,000 MG TOTAL) BY MOUTH 2 (TWO) TIMES DAILY.   metoprolol succinate (TOPROL-XL) 100 MG 24 hr tablet Take 1 tablet (100 mg total) by mouth daily. Take with or immediately following a meal.   metoprolol succinate (TOPROL-XL) 50 MG 24 hr tablet Take 1 tablet (50 mg total) by mouth daily. Take with or immediately following a meal, take in the evening.   Multiple Vitamin (MULTIVITAMIN PO) Take 1 tablet by mouth daily.   nitroGLYCERIN (NITROSTAT) 0.4 MG SL tablet Place 1 tablet (0.4 mg total) under the tongue every 5 (five) minutes x 3 doses as needed for chest pain.   ticagrelor (BRILINTA) 90 MG TABS tablet Take 1 tablet (90 mg total) by mouth 2 (two) times daily.     Allergies:   Trazodone and nefazodone   Social History   Tobacco Use   Smoking status: Former    Types: Cigarettes    Quit date: 1996    Years since quitting: 27.8   Smokeless tobacco: Never  Vaping Use   Vaping Use: Never used  Substance Use Topics   Alcohol use: No   Drug use: Never     Family Hx: The patient's family history includes Breast cancer in her maternal aunt; Diabetes in her brother and mother; HIV/AIDS in her brother; Heart attack in her father; Hypertension in her brother and  brother; Liver disease in her brother; Pancreatic cancer in her mother. There is no history of Rectal cancer, Colon cancer, or Esophageal cancer.  ROS:   Please see the history of present illness.     All other systems reviewed and are negative.   Prior Sleep studies:   The following studies were reviewed today:  Home sleep study and PAP compliance download  Labs/Other Tests and Data Reviewed:     Recent Labs: 08/23/2021: Magnesium 1.6; TSH 1.921 08/26/2021: BUN 17; Creatinine, Ser 1.11;  Hemoglobin 12.2; Platelets 307; Potassium 3.7; Sodium 134    Wt Readings from Last 3 Encounters:  03/10/22 175 lb (79.4 kg)  03/02/22 177 lb 4.8 oz (80.4 kg)  12/19/21 178 lb 4.8 oz (80.9 kg)     Risk Assessment/Calculations:          Objective:    Vital Signs:  BP 130/75   Pulse 83   Ht '5\' 3"'$  (1.6 m)   Wt 175 lb (79.4 kg)   BMI 31.00 kg/m    VITAL SIGNS:  reviewed GEN:  no acute distress EYES:  sclerae anicteric, EOMI - Extraocular Movements Intact RESPIRATORY:  normal respiratory effort, symmetric expansion CARDIOVASCULAR:  no peripheral edema SKIN:  no rash, lesions or ulcers. MUSCULOSKELETAL:  no obvious deformities. NEURO:  alert and oriented x 3, no obvious focal deficit PSYCH:  normal affect  ASSESSMENT & PLAN:    OSA -  The PAP download performed by his DME was personally reviewed and interpreted by me today and showed an AHI of 7.7/hr on auto CPAP from 4-15 cm H2O with 100% compliance in using more than 4 hours nightly.   -she is very intolerant of the mask and is sleeping worse with the device on -since she only has very mild OSA I will refer her to Dr. Ron Parker to evaluate for possible oral device  HTN -BP controlled on exam today -continue prescription drug management with Amlodipine '10mg'$  daily, HCTZ '25mg'$  daily, Losartan '100mg'$  daily and Toprol XL '150mg'$  daily with PRN refills   Time:   Today, I have spent 15 minutes with the patient with telehealth technology  discussing the above problems.     Medication Adjustments/Labs and Tests Ordered: Current medicines are reviewed at length with the patient today.  Concerns regarding medicines are outlined above.   Tests Ordered: No orders of the defined types were placed in this encounter.   Medication Changes: No orders of the defined types were placed in this encounter.   Follow Up:  In Person in 3 month(s)  Signed, Fransico Him, MD  03/10/2022 8:53 AM    [place]

## 2022-03-10 ENCOUNTER — Ambulatory Visit: Payer: Medicare PPO | Attending: Cardiology | Admitting: Cardiology

## 2022-03-10 ENCOUNTER — Encounter: Payer: Self-pay | Admitting: Cardiology

## 2022-03-10 VITALS — BP 130/75 | HR 83 | Ht 63.0 in | Wt 175.0 lb

## 2022-03-10 DIAGNOSIS — G4733 Obstructive sleep apnea (adult) (pediatric): Secondary | ICD-10-CM | POA: Diagnosis not present

## 2022-03-10 DIAGNOSIS — I10 Essential (primary) hypertension: Secondary | ICD-10-CM

## 2022-03-10 NOTE — Patient Instructions (Signed)
Medication Instructions:  Your physician recommends that you continue on your current medications as directed. Please refer to the Current Medication list given to you today.  *If you need a refill on your cardiac medications before your next appointment, please call your pharmacy*   Follow-Up: At [facility], you and your health needs are our priority.  As part of our continuing mission to provide you with exceptional heart care, we have created designated Provider Care Teams.  These Care Teams include your primary Cardiologist (physician) and Advanced Practice Providers (APPs -  Physician Assistants and Nurse Practitioners) who all work together to provide you with the care you need, when you need it.  Your next appointment:   3 month(s)  The format for your next appointment:   In Person  Provider:   Fransico Him, MD  Other Instructions You have been referred to Dr. Ron Parker  Important Information About Sugar

## 2022-03-10 NOTE — Addendum Note (Signed)
Addended by: Bernestine Amass on: 03/10/2022 09:08 AM   Modules accepted: Orders

## 2022-04-04 NOTE — Addendum Note (Signed)
Addended by: Vergia Alcon A on: 04/04/2022 03:49 PM   Modules accepted: Orders

## 2022-05-10 ENCOUNTER — Encounter: Payer: Self-pay | Admitting: Internal Medicine

## 2022-06-09 ENCOUNTER — Other Ambulatory Visit: Payer: Self-pay

## 2022-06-14 DIAGNOSIS — G4733 Obstructive sleep apnea (adult) (pediatric): Secondary | ICD-10-CM | POA: Diagnosis not present

## 2022-06-20 ENCOUNTER — Encounter: Payer: Self-pay | Admitting: Cardiology

## 2022-06-20 ENCOUNTER — Ambulatory Visit: Payer: Medicare PPO | Attending: Cardiology | Admitting: Cardiology

## 2022-06-20 VITALS — BP 120/64 | HR 67 | Wt 175.0 lb

## 2022-06-20 DIAGNOSIS — G4733 Obstructive sleep apnea (adult) (pediatric): Secondary | ICD-10-CM | POA: Diagnosis not present

## 2022-06-20 DIAGNOSIS — I10 Essential (primary) hypertension: Secondary | ICD-10-CM

## 2022-06-20 NOTE — Patient Instructions (Signed)
Medication Instructions:  Your physician recommends that you continue on your current medications as directed. Please refer to the Current Medication list given to you today.  *If you need a refill on your cardiac medications before your next appointment, please call your pharmacy*  Follow-Up: At [facility], you and your health needs are our priority.  As part of our continuing mission to provide you with exceptional heart care, we have created designated Provider Care Teams.  These Care Teams include your primary Cardiologist (physician) and Advanced Practice Providers (APPs -  Physician Assistants and Nurse Practitioners) who all work together to provide you with the care you need, when you need it.  We recommend signing up for the patient portal called "MyChart".  Sign up information is provided on this After Visit Summary.  MyChart is used to connect with patients for Virtual Visits (Telemedicine).  Patients are able to view lab/test results, encounter notes, upcoming appointments, etc.  Non-urgent messages can be sent to your provider as well.   To learn more about what you can do with MyChart, go to [email or web address].    Your next appointment:   1 year(s)  Provider:   Dr. Turner  

## 2022-06-20 NOTE — Progress Notes (Unsigned)
SLEEP MEDICINE VIRTUAL NOTE via Video Note   Because of Charlissa Grandpre's co-morbid illnesses, she is at least at moderate risk for complications without adequate follow up.  This format is felt to be most appropriate for this patient at this time.  All issues noted in this document were discussed and addressed.  A limited physical exam was performed with this format.  Please refer to the patient's chart for her consent to telehealth for [facility]      Date:  06/20/2022   ID:  [name], DOB 1955-11-19, MRN [id number] The patient was identified using 2 identifiers.  Patient Location: Home Provider Location: Home Office   PCP:  [name], MD   [place] Providers Cardiologist:  Buford Dresser, MD      Chief Complaint:  OSA  History of Present Illness:    Zhane Donlan is a 67 y.o. female with a hx of ASCAD, HTN, HLD, TIA, DM who saw Ambrose Pancoast, NP and complained of problems with  her husband complaining that she was snoring a lot and he noticed her stop breathing in her sleep.  She underwent home sleep study with a WatchPat 1 device which showed Mild OSA with an AHI of 8.7/hr and O2 sats as low as 84%.  She was started on auto CPAP from 4 -15cm H2O.  She did not like the PAP device so she  was referred to Dr. Ron Parker with sleep dentistry.  She decided to hold off on the oral device and got a nasal pillow mask and is doing better with the PAP device.  She has not been using a chin strap with the nasal pillow mask and has a lot of mouth dryness.  She has not changed out her nasal pillows since she started.  She feels rested when she gets up   Past Medical History:  Diagnosis Date   Allergy    Bell's palsy    CAD (coronary artery disease)    Diabetes mellitus without complication ([place])    Gallstones    Heart disease    HLD (hyperlipidemia)    Hypertension    Mini stroke    TIA (transient ischemic attack) 06/30/2016   Past  Surgical History:  Procedure Laterality Date   CHOLECYSTECTOMY     CORONARY ANGIOPLASTY WITH STENT PLACEMENT     CORONARY STENT INTERVENTION N/A 08/25/2021   Procedure: CORONARY STENT INTERVENTION;  Surgeon: Jettie Booze, MD;  Location: [place] [facility];  Service: Cardiovascular;  Laterality: N/A;   INTRAVASCULAR ULTRASOUND/IVUS N/A 08/25/2021   Procedure: Intravascular Ultrasound/IVUS;  Surgeon: Jettie Booze, MD;  Location: [place] [facility];  Service: Cardiovascular;  Laterality: N/A;   LEFT HEART CATH AND CORONARY ANGIOGRAPHY N/A 08/25/2021   Procedure: LEFT HEART CATH AND CORONARY ANGIOGRAPHY;  Surgeon: Jettie Booze, MD;  Location: [place] [facility];  Service: Cardiovascular;  Laterality: N/A;   TONSILLECTOMY       No outpatient medications have been marked as taking for the 06/20/22 encounter (Video Visit) with Sueanne Margarita, MD.     Allergies:   Trazodone and nefazodone   Social History   Tobacco Use   Smoking status: Former    Types: Cigarettes    Quit date: 1996    Years since quitting: 28.0   Smokeless tobacco: Never  Vaping Use   Vaping Use: Never used  Substance Use Topics   Alcohol use: No   Drug use: Never  Family Hx: The patient's family history includes Breast cancer in her maternal aunt; Diabetes in her brother and mother; HIV/AIDS in her brother; Heart attack in her father; Hypertension in her brother and brother; Liver disease in her brother; Pancreatic cancer in her mother. There is no history of Rectal cancer, Colon cancer, or Esophageal cancer.  ROS:   Please see the history of present illness.     All other systems reviewed and are negative.   Prior Sleep studies:   The following studies were reviewed today:  Home sleep study and PAP compliance download  Labs/Other Tests and Data Reviewed:     Recent Labs: 08/23/2021: Magnesium 1.6; TSH 1.921 08/26/2021: BUN 17; Creatinine, Ser 1.11; Hemoglobin 12.2; Platelets 307;  Potassium 3.7; Sodium 134    Wt Readings from Last 3 Encounters:  06/20/22 175 lb (79.4 kg)  03/10/22 175 lb (79.4 kg)  03/02/22 177 lb 4.8 oz (80.4 kg)     Risk Assessment/Calculations:          Objective:    Vital Signs:  BP 120/64   Pulse 67   Wt 175 lb (79.4 kg)   BMI 31.00 kg/m   Well nourished, well developed female in no acute distress. Well appearing, alert and conversant, regular work of breathing,  good skin color  Eyes- anicteric mouth- oral mucosa is pink  neuro- grossly intact skin- no apparent rash or lesions or cyanosis ASSESSMENT & PLAN:    OSA - The patient is tolerating PAP therapy well without any problems. The PAP download performed by his DME was personally reviewed and interpreted by me today and showed an AHI of 15.5/hr on auto CPAP from 4 to 15 cm H2O with 97% compliance in using more than 4 hours nightly.  The patient has been using and benefiting from PAP use and will continue to benefit from therapy.  -she saw sleep dentistry but decided to hold off on oral device -her download shows a high mask leak and she has not changed out her nasal pillow mask since she got it and also is breathing through her mouth with significant mouth dryness -I will order a chin strap and supplies and instructed her to change out her nasal pillow mask every 4 weeks to avoid leakage -repeat download in 4 weeks  HTN -BP controlled on exam today -continue prescription drug management with Amlodipine '10mg'$  daily, HCTZ '25mg'$  daily, Losartan '100mg'$  daily and Toprol XL '150mg'$  daily with PRN refills  Time:   Today, I have spent 15 minutes with the patient with telehealth technology discussing the above problems.     Medication Adjustments/Labs and Tests Ordered: Current medicines are reviewed at length with the patient today.  Concerns regarding medicines are outlined above.   Tests Ordered: No orders of the defined types were placed in this encounter.   Medication  Changes: No orders of the defined types were placed in this encounter.   Follow Up:  In Person in 1 year  Signed, Fransico Him, MD  06/20/2022 8:57 AM    [place]

## 2022-07-10 ENCOUNTER — Encounter: Payer: Self-pay | Admitting: Internal Medicine

## 2022-07-10 ENCOUNTER — Ambulatory Visit: Payer: Medicare PPO | Attending: Internal Medicine | Admitting: Internal Medicine

## 2022-07-10 VITALS — BP 143/78 | HR 77 | Temp 98.4°F | Ht 63.0 in | Wt 184.0 lb

## 2022-07-10 DIAGNOSIS — I152 Hypertension secondary to endocrine disorders: Secondary | ICD-10-CM | POA: Diagnosis not present

## 2022-07-10 DIAGNOSIS — E669 Obesity, unspecified: Secondary | ICD-10-CM | POA: Diagnosis not present

## 2022-07-10 DIAGNOSIS — E785 Hyperlipidemia, unspecified: Secondary | ICD-10-CM | POA: Diagnosis not present

## 2022-07-10 DIAGNOSIS — E1169 Type 2 diabetes mellitus with other specified complication: Secondary | ICD-10-CM | POA: Diagnosis not present

## 2022-07-10 DIAGNOSIS — E1159 Type 2 diabetes mellitus with other circulatory complications: Secondary | ICD-10-CM | POA: Diagnosis not present

## 2022-07-10 DIAGNOSIS — I479 Paroxysmal tachycardia, unspecified: Secondary | ICD-10-CM | POA: Insufficient documentation

## 2022-07-10 DIAGNOSIS — Z6832 Body mass index (BMI) 32.0-32.9, adult: Secondary | ICD-10-CM | POA: Diagnosis not present

## 2022-07-10 LAB — POCT GLYCOSYLATED HEMOGLOBIN (HGB A1C): HbA1c, POC (controlled diabetic range): 6.8 % (ref 0.0–7.0)

## 2022-07-10 LAB — GLUCOSE, POCT (MANUAL RESULT ENTRY): POC Glucose: 157 mg/dl — AB (ref 70–99)

## 2022-07-10 NOTE — Progress Notes (Signed)
Patient ID: Breanna Obrien, female    DOB: 25-Nov-1955  MRN: [id number]  CC: Diabetes (DM f/u. Baird Kay with seasonal allergies, runny nose. /Pulled muscle on R thigh./Already received flu vax. )   Subjective: Breanna Obrien is a 67 y.o. female who presents for chronic Her concerns today include:  left pontine CVA (09/2016), HTN, HL, diabetes type 2, BL carotid artery stenosis, and hx of CAD (stent x 2 per pt's old medical records, 3 stents to RCA 07/2021).   DM/obese: Results for orders placed or performed in visit on 07/10/22  POCT glucose (manual entry)  Result Value Ref Range   POC Glucose 157 (A) 70 - 99 mg/dl  POCT glycosylated hemoglobin (Hb A1C)  Result Value Ref Range   Hemoglobin A1C     HbA1c POC (<> result, manual entry)     HbA1c, POC (prediabetic range)     HbA1c, POC (controlled diabetic range) 6.8 0.0 - 7.0 %   Fell off the wagon over [employer] holidays with her eating habits.  Wgh up 9 lbs since last visit. Her scale this 180 lbs but reports she is still up 10 lbs from before the holidays Walking 3 times a wk except on days when it rains.  Has ordered a walking mat Compliant with meds: Lantus insulin 27 units daily, glipizide 5 mg twice a day, metformin 1 g twice a day and Farxiga 10 mg daily Checks BS 3-4 x day.  Range 95-180 after meals  HYPERTENSION/CAD/HL Currently taking: see medication list.  Takes BP meds in evenings except the HCTZ and Metoprolol XL 100 mg in a.m. Other meds including Norvasc 10 mg, Cozaar 100 mg, Metoprolol XL 50 mg and Lipitor 40/Zetia 10 mg at nights Med Adherence: [x]$  Yes    []$  No Medication side effects: []$  Yes    []$  No Adherence with salt restriction: [x]$  Yes    []$  No Home Monitoring?: [x]$  Yes    []$  No Monitoring Frequency: 3-4x/day Home BP results range: 120s/60s SOB? []$  Yes    [x]$  No Chest Pain?: []$  Yes    [x]$  No Leg swelling?: []$  Yes    [x]$  No Headaches?: []$  Yes    [x]$  No Dizziness? 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 11/12/2020   Abnormal mammogram 01/15/2017   CAD S/P percutaneous coronary angioplasty 01/15/2017   Hx of Bell's palsy 01/15/2017   Insomnia due to medical condition 10/24/2016   Depression 10/24/2016   Type 2 diabetes mellitus with complication, without long-term current use of insulin ([place]) 10/06/2016   Essential hypertension 10/06/2016   Ataxia    Hyperlipidemia    Bilateral carotid artery stenosis    CVA (cerebral vascular accident) ([place]) 10/05/2016   TIA (transient ischemic attack) 06/30/2016     Current Outpatient Medications on File Prior to Visit  Medication Sig Dispense Refill   ALPHA LIPOIC ACID PO Take 1 tablet by mouth daily. With green tea     amLODipine (NORVASC) 10 MG tablet TAKE 1 TABLET (10 MG TOTAL) BY MOUTH DAILY. 90 tablet 3   Ascorbic Acid (VITAMIN C PO) Take 1  tablet by mouth daily.     aspirin 81 MG chewable tablet Chew 1 tablet (81 mg total) by mouth daily.     atorvastatin (LIPITOR) 40 MG tablet Take 1 tablet (40 mg total) by mouth at bedtime. 90 tablet 3   calcium-vitamin D (OSCAL WITH D) 500-200 MG-UNIT tablet Take 1 tablet by mouth.     CHROMIUM PO Take 1 tablet by mouth daily.     dapagliflozin propanediol (FARXIGA) 10 MG TABS tablet Take 1 tablet (10 mg total) by mouth daily. 90 tablet 3   ezetimibe (ZETIA) 10 MG tablet Take 1 tablet (10 mg total) by mouth daily. Please keep schedule appointment with cardiologist 30 tablet 0   Fish Oil-Cholecalciferol (FISH OIL + D3 PO) Take 1 tablet by mouth daily.     glipiZIDE (GLUCOTROL) 10 MG tablet Take 0.5  tablets (5 mg total) by mouth 2 (two) times daily before a meal. 180 tablet 3   hydrochlorothiazide (HYDRODIURIL) 25 MG tablet Take 1 tablet (25 mg total) by mouth daily. 90 tablet 3   insulin glargine (LANTUS) 100 UNIT/ML injection Inject 0.27 mLs (27 Units total) into the skin at bedtime. 30 mL 11   Insulin Syringe-Needle U-100 (TRUEPLUS INSULIN SYRINGE) 30G X 5/16" 0.5 ML MISC UAD 100 each 11   losartan (COZAAR) 100 MG tablet TAKE 1 TABLET (100 MG TOTAL) BY MOUTH DAILY. 90 tablet 3   MAGNESIUM PO Take 1 tablet by mouth daily.     metFORMIN (GLUCOPHAGE) 1000 MG tablet TAKE 1 TABLET (1,000 MG TOTAL) BY MOUTH 2 (TWO) TIMES DAILY. 180 tablet 3   metoprolol succinate (TOPROL-XL) 100 MG 24 hr tablet Take 1 tablet (100 mg total) by mouth daily. Take with or immediately following a meal. 90 tablet 3   metoprolol succinate (TOPROL-XL) 50 MG 24 hr tablet Take 1 tablet (50 mg total) by mouth daily. Take with or immediately following a meal, take in the evening. 90 tablet 3   Multiple Vitamin (MULTIVITAMIN PO) Take 1 tablet by mouth daily.     nitroGLYCERIN (NITROSTAT) 0.4 MG SL tablet Place 1 tablet (0.4 mg total) under the tongue every 5 (five) minutes x 3 doses as needed for chest pain. 25 tablet 1   ticagrelor (BRILINTA) 90 MG TABS tablet Take 1 tablet (90 mg total) by mouth 2 (two) times daily. 60 tablet 1   No current facility-administered medications on file prior to visit.    Allergies  Allergen Reactions   Trazodone And Nefazodone Other (See Comments)    Makes her feel jittery    Social History   Socioeconomic History   Marital status: Married    Spouse name: Not on file   Number of children: 1   Years of education: Not on file   Highest education level: Not on file  Occupational History   Occupation: retired  Tobacco Use   Smoking status: Former    Types: Cigarettes    Quit date: 1996    Years since quitting: 28.1   Smokeless tobacco: Never  Vaping Use   Vaping Use: Never used   Substance and Sexual Activity   Alcohol use: No   Drug use: Never   Sexual activity: Yes    Birth control/protection: None, Post-menopausal  Other Topics Concern   Not on file  Social History Narrative   Not on file   Social Determinants of Health   Financial Resource Strain: Not on file  Food Insecurity: No Food Insecurity (11/18/2020)   Hunger Vital  Sign    Worried About Charity fundraiser in the Last Year: Never true    [place] in the Last Year: Never true  Transportation Needs: No Transportation Needs (11/18/2020)   PRAPARE - Hydrologist (Medical): No    Lack of Transportation (Non-Medical): No  Physical Activity: Not on file  Stress: Not on file  Social Connections: Not on file  Intimate Partner Violence: Not on file    Family History  Problem Relation Age of Onset   Pancreatic cancer Mother    Diabetes Mother    Heart attack Father    Diabetes Brother    Liver disease Brother        drinker   Hypertension Brother    Hypertension Brother    HIV/AIDS Brother    Breast cancer Maternal Aunt    Rectal cancer Neg Hx    Colon cancer Neg Hx    Esophageal cancer Neg Hx     Past Surgical History:  Procedure Laterality Date   CHOLECYSTECTOMY     CORONARY ANGIOPLASTY WITH STENT PLACEMENT     CORONARY STENT INTERVENTION N/A 08/25/2021   Procedure: CORONARY STENT INTERVENTION;  Surgeon: Jettie Booze, MD;  Location: [place] [facility];  Service: Cardiovascular;  Laterality: N/A;   INTRAVASCULAR ULTRASOUND/IVUS N/A 08/25/2021   Procedure: Intravascular Ultrasound/IVUS;  Surgeon: Jettie Booze, MD;  Location: [place] [facility];  Service: Cardiovascular;  Laterality: N/A;   LEFT HEART CATH AND CORONARY ANGIOGRAPHY N/A 08/25/2021   Procedure: LEFT HEART CATH AND CORONARY ANGIOGRAPHY;  Surgeon: Jettie Booze, MD;  Location: [facility];  Service: Cardiovascular;  Laterality: N/A;   TONSILLECTOMY       ROS: Review of Systems Negative except as stated above  PHYSICAL EXAM: BP (!) 143/78 (BP Location: Left Arm, Patient Position: Sitting, Cuff Size: Normal)   Pulse 77   Temp 98.4 F (36.9 C) (Oral)   Ht 5' 3"$  (1.6 m)   Wt 184 lb (83.5 kg)   SpO2 99%   BMI 32.59 kg/m   Wt Readings from Last 3 Encounters:  07/10/22 184 lb (83.5 kg)  06/20/22 175 lb (79.4 kg)  03/10/22 175 lb (79.4 kg)   Repeat blood pressure 143/79 Physical Exam  General appearance - alert, well appearing, and in no distress Mental status - normal mood, behavior, speech, dress, motor activity, and thought processes Neck - supple, no significant adenopathy Chest - clear to auscultation, no wheezes, rales or rhonchi, symmetric air entry Heart - normal rate, regular rhythm, normal S1, S2, no murmurs, rubs, clicks or gallops Extremities - peripheral pulses normal, no pedal edema, no clubbing or cyanosis      Latest Ref Rng & Units 08/26/2021    2:29 AM 08/25/2021    5:23 AM 08/24/2021    4:24 AM  CMP  Glucose 70 - 99 mg/dL 125  121  104   BUN 8 - 23 mg/dL 17  15  13   $ Creatinine 0.44 - 1.00 mg/dL 1.11  0.92  0.81   Sodium 135 - 145 mmol/L 134  135  135   Potassium 3.5 - 5.1 mmol/L 3.7  3.7  3.6   Chloride 98 - 111 mmol/L 100  102  102   CO2 22 - 32 mmol/L 26  25  23   $ Calcium 8.9 - 10.3 mg/dL 9.2  9.4  9.4    Lipid Panel     Component Value  Date/Time   CHOL 130 12/19/2021 1040   TRIG 111 12/19/2021 1040   HDL 43 12/19/2021 1040   CHOLHDL 3.0 12/19/2021 1040   CHOLHDL 5.1 10/06/2016 0333   VLDL 31 10/06/2016 0333   LDLCALC 67 12/19/2021 1040    CBC    Component Value Date/Time   WBC 6.8 08/26/2021 0229   RBC 4.02 08/26/2021 0229   HGB 12.2 08/26/2021 0229   HGB 12.4 09/20/2020 0854   HCT 35.7 (L) 08/26/2021 0229   HCT 38.0 09/20/2020 0854   PLT 307 08/26/2021 0229   PLT 301 09/20/2020 0854   MCV 88.8 08/26/2021 0229   MCV 92 09/20/2020 0854   MCH 30.3 08/26/2021 0229   MCHC 34.2  08/26/2021 0229   RDW 12.8 08/26/2021 0229   RDW 13.3 09/20/2020 0854   LYMPHSABS 2.4 08/23/2021 0343   MONOABS 0.8 08/23/2021 0343   EOSABS 0.3 08/23/2021 0343   BASOSABS 0.0 08/23/2021 0343    ASSESSMENT AND PLAN:  1. Type 2 diabetes mellitus with other circulatory complication, unspecified whether long term insulin use (HCC) A1c is at goal. Continue medications as listed above. Encouraged healthy eating habits.  She plans to get back in In terms of her eating habits.  Continue regular exercise. - POCT glucose (manual entry) - POCT glycosylated hemoglobin (Hb A1C)  2. Hyperlipidemia associated with type 2 diabetes mellitus (HCC) Continue atorvastatin 40 mg on Setia 10 mg daily.  3. Hypertension associated with diabetes ([place]) Patient reports good home blood pressure readings but reading today is elevated.  We will have her follow-up with the clinical pharmacist in 1 month and bring her home device with her. She will continue current medications including amlodipine 10 mg daily, HCTZ 25 mg daily, Cozaar 100 mg daily, metoprolol XL 100 mg in the morning and 50 mg in the evening  4. Obesity (BMI 30.0-34.9) See #1 above.     Patient was given the opportunity to ask questions.  Patient verbalized understanding of the plan and was able to repeat key elements of the plan.   This documentation was completed using Radio producer.  Any transcriptional errors are unintentional.  Orders Placed This Encounter  Procedures   POCT glucose (manual entry)   POCT glycosylated hemoglobin (Hb A1C)     Requested Prescriptions    No prescriptions requested or ordered in this encounter    No follow-ups on file.  Karle Plumber, MD, FACP

## 2022-07-15 DIAGNOSIS — G4733 Obstructive sleep apnea (adult) (pediatric): Secondary | ICD-10-CM | POA: Diagnosis not present

## 2022-07-28 ENCOUNTER — Ambulatory Visit: Payer: Medicare PPO | Attending: Internal Medicine

## 2022-07-28 DIAGNOSIS — Z0000 Encounter for general adult medical examination without abnormal findings: Secondary | ICD-10-CM | POA: Diagnosis not present

## 2022-07-28 NOTE — Patient Instructions (Signed)
Breanna Obrien , Thank you for taking time to come for your Medicare Wellness Visit. I appreciate your ongoing commitment to your health goals. Please review the following plan we discussed and let me know if I can assist you in the future.   These are the goals we discussed:  Goals      Weight (lb) < 200 lb (90.7 kg)     I want to lose 15 lbs and eventually get off insulin.        This is a list of the screening recommended for you and due dates:  Health Maintenance  Topic Date Due   Yearly kidney function blood test for diabetes  08/27/2022   COVID-19 Vaccine (4 - 2023-24 season) 08/13/2022*   Complete foot exam   11/01/2022   Mammogram  11/19/2022   Hemoglobin A1C  01/08/2023   Eye exam for diabetics  02/21/2023   Yearly kidney health urinalysis for diabetes  03/03/2023   Medicare Annual Wellness Visit  07/28/2023   DTaP/Tdap/Td vaccine (2 - Td or Tdap) 10/25/2026   Colon Cancer Screening  11/27/2030   Pneumonia Vaccine  Completed   Flu Shot  Completed   DEXA scan (bone density measurement)  Completed   Hepatitis C Screening: USPSTF Recommendation to screen - Ages 63-79 yo.  Completed   Zoster (Shingles) Vaccine  Completed   HPV Vaccine  Aged Out  *Topic was postponed. The date shown is not the original due date.   Health Maintenance, Female Adopting a healthy lifestyle and getting preventive care are important in promoting health and wellness. Ask your health care provider about: The right schedule for you to have regular tests and exams. Things you can do on your own to prevent diseases and keep yourself healthy. What should I know about diet, weight, and exercise? Eat a healthy diet  Eat a diet that includes plenty of vegetables, fruits, low-fat dairy products, and lean protein. Do not eat a lot of foods that are high in solid fats, added sugars, or sodium. Maintain a healthy weight Body mass index (BMI) is used to identify weight problems. It estimates body fat based on  height and weight. Your health care provider can help determine your BMI and help you achieve or maintain a healthy weight. Get regular exercise Get regular exercise. This is one of the most important things you can do for your health. Most adults should: Exercise for at least 150 minutes each week. The exercise should increase your heart rate and make you sweat (moderate-intensity exercise). Do strengthening exercises at least twice a week. This is in addition to the moderate-intensity exercise. Spend less time sitting. Even light physical activity can be beneficial. Watch cholesterol and blood lipids Have your blood tested for lipids and cholesterol at 67 years of age, then have this test every 5 years. Have your cholesterol levels checked more often if: Your lipid or cholesterol levels are high. You are older than 67 years of age. You are at high risk for heart disease. What should I know about cancer screening? Depending on your health history and family history, you may need to have cancer screening at various ages. This may include screening for: Breast cancer. Cervical cancer. Colorectal cancer. Skin cancer. Lung cancer. What should I know about heart disease, diabetes, and high blood pressure? Blood pressure and heart disease High blood pressure causes heart disease and increases the risk of stroke. This is more likely to develop in people who have high blood pressure  readings or are overweight. Have your blood pressure checked: Every 3-5 years if you are 65-62 years of age. Every year if you are 3 years old or older. Diabetes Have regular diabetes screenings. This checks your fasting blood sugar level. Have the screening done: Once every three years after age 52 if you are at a normal weight and have a low risk for diabetes. More often and at a younger age if you are overweight or have a high risk for diabetes. What should I know about preventing infection? Hepatitis B If you  have a higher risk for hepatitis B, you should be screened for this virus. Talk with your health care provider to find out if you are at risk for hepatitis B infection. Hepatitis C Testing is recommended for: Everyone born from [birth year] through 1965. Anyone with known risk factors for hepatitis C. Sexually transmitted infections (STIs) Get screened for STIs, including gonorrhea and chlamydia, if: You are sexually active and are younger than 68 years of age. You are older than 67 years of age and your health care provider tells you that you are at risk for this type of infection. Your sexual activity has changed since you were last screened, and you are at increased risk for chlamydia or gonorrhea. Ask your health care provider if you are at risk. Ask your health care provider about whether you are at high risk for HIV. Your health care provider may recommend a prescription medicine to help prevent HIV infection. If you choose to take medicine to prevent HIV, you should first get tested for HIV. You should then be tested every 3 months for as long as you are taking the medicine. Pregnancy If you are about to stop having your period (premenopausal) and you may become pregnant, seek counseling before you get pregnant. Take 400 to 800 micrograms (mcg) of folic acid every day if you become pregnant. Ask for birth control (contraception) if you want to prevent pregnancy. Osteoporosis and menopause Osteoporosis is a disease in which the bones lose minerals and strength with aging. This can result in bone fractures. If you are 95 years old or older, or if you are at risk for osteoporosis and fractures, ask your health care provider if you should: Be screened for bone loss. Take a calcium or vitamin D supplement to lower your risk of fractures. Be given hormone replacement therapy (HRT) to treat symptoms of menopause. Follow these instructions at home: Alcohol use Do not drink alcohol if: Your health care  provider tells you not to drink. You are pregnant, may be pregnant, or are planning to become pregnant. If you drink alcohol: Limit how much you have to: 0-1 drink a day. Know how much alcohol is in your drink. In the [place] one drink equals one 12 oz bottle of beer (355 mL), one 5 oz glass of wine (148 mL), or one 1 oz glass of hard liquor (44 mL). Lifestyle Do not use any products that contain nicotine or tobacco. These products include cigarettes, chewing tobacco, and vaping devices, such as e-cigarettes. If you need help quitting, ask your health care provider. Do not use street drugs. Do not share needles. Ask your health care provider for help if you need support or information about quitting drugs. General instructions Schedule regular health, dental, and eye exams. Stay current with your vaccines. Tell your health care provider if: You often feel depressed. You have ever been abused or do not feel safe at home. Summary Adopting a  healthy lifestyle and getting preventive care are important in promoting health and wellness. Follow your health care provider's instructions about healthy diet, exercising, and getting tested or screened for diseases. Follow your health care provider's instructions on monitoring your cholesterol and blood pressure. This information is not intended to replace advice given to you by your health care provider. Make sure you discuss any questions you have with your health care provider. Document Revised: 10/04/2020 Document Reviewed: 10/04/2020 Elsevier Patient Education  [place].

## 2022-07-28 NOTE — Progress Notes (Signed)
Subjective:   Breanna Obrien is a 67 y.o. female who presents for Medicare Annual (Subsequent) preventive examination.  Review of Systems     I connected with [name] on 07/28/2022 at 10:56 am by telephone and verified that I am speaking with the correct person using two identifiers. I discussed the limitations, risks, security and privacy concerns of performing an evaluation and management service by telephone and the availability of in person appointments. I also discussed with the patient that there may be a patient responsible charge related to this service. The patient expressed understanding and agreed to proceed.   Patient location: Home My Location: [place] on the telephone call: Myself and [name]     Cardiac Risk Factors include: none     Objective:    There were no vitals filed for this visit. There is no height or weight on file to calculate BMI.     07/28/2022   11:00 AM 08/23/2021    3:15 AM 07/26/2021   10:23 AM 10/24/2016    9:55 AM 10/13/2016    9:17 AM 10/06/2016    3:00 AM 10/05/2016   12:58 PM  Advanced Directives  Does Patient Have a Medical Advance Directive? No No No No No No No  Would patient like information on creating a medical advance directive? Yes (ED - Information included in AVS) No - Patient declined Yes (Inpatient - patient defers creating a medical advance directive at this time - Information given) No - Patient declined Yes (MAU/Ambulatory/Procedural Areas - Information given) No - Patient declined     Current Medications (verified) Outpatient Encounter Medications as of 07/28/2022  Medication Sig   ALPHA LIPOIC ACID PO Take 1 tablet by mouth daily. With green tea   amLODipine (NORVASC) 10 MG tablet TAKE 1 TABLET (10 MG TOTAL) BY MOUTH DAILY.   Ascorbic Acid (VITAMIN C PO) Take 1 tablet by mouth daily.   aspirin 81 MG chewable tablet Chew 1 tablet (81 mg total) by mouth daily.   atorvastatin (LIPITOR) 40 MG  tablet Take 1 tablet (40 mg total) by mouth at bedtime.   calcium-vitamin D (OSCAL WITH D) 500-200 MG-UNIT tablet Take 1 tablet by mouth.   CHROMIUM PO Take 1 tablet by mouth daily.   dapagliflozin propanediol (FARXIGA) 10 MG TABS tablet Take 1 tablet (10 mg total) by mouth daily.   ezetimibe (ZETIA) 10 MG tablet Take 1 tablet (10 mg total) by mouth daily. Please keep schedule appointment with cardiologist   Fish Oil-Cholecalciferol (FISH OIL + D3 PO) Take 1 tablet by mouth daily.   glipiZIDE (GLUCOTROL) 10 MG tablet Take 0.5 tablets (5 mg total) by mouth 2 (two) times daily before a meal.   hydrochlorothiazide (HYDRODIURIL) 25 MG tablet Take 1 tablet (25 mg total) by mouth daily.   insulin glargine (LANTUS) 100 UNIT/ML injection Inject 0.27 mLs (27 Units total) into the skin at bedtime.   Insulin Syringe-Needle U-100 (TRUEPLUS INSULIN SYRINGE) 30G X 5/16" 0.5 ML MISC UAD   losartan (COZAAR) 100 MG tablet TAKE 1 TABLET (100 MG TOTAL) BY MOUTH DAILY.   MAGNESIUM PO Take 1 tablet by mouth daily.   metFORMIN (GLUCOPHAGE) 1000 MG tablet TAKE 1 TABLET (1,000 MG TOTAL) BY MOUTH 2 (TWO) TIMES DAILY.   metoprolol succinate (TOPROL-XL) 100 MG 24 hr tablet Take 1 tablet (100 mg total) by mouth daily. Take with or immediately following a meal.   metoprolol succinate (TOPROL-XL) 50 MG 24 hr tablet Take 1  tablet (50 mg total) by mouth daily. Take with or immediately following a meal, take in the evening.   Multiple Vitamin (MULTIVITAMIN PO) Take 1 tablet by mouth daily.   nitroGLYCERIN (NITROSTAT) 0.4 MG SL tablet Place 1 tablet (0.4 mg total) under the tongue every 5 (five) minutes x 3 doses as needed for chest pain.   ticagrelor (BRILINTA) 90 MG TABS tablet Take 1 tablet (90 mg total) by mouth 2 (two) times daily.   No facility-administered encounter medications on file as of 07/28/2022.    Allergies (verified) Trazodone and nefazodone   History: Past Medical History:  Diagnosis Date   Allergy     Arthritis    Bell's palsy    CAD (coronary artery disease)    Depression    Diabetes mellitus without complication ([place])    Gallstones    Heart disease    HLD (hyperlipidemia)    Hypertension    Mini stroke    Stroke ([place])    [place]ia Mini Stroke   TIA (transient ischemic attack) 06/30/2016   Past Surgical History:  Procedure Laterality Date   CHOLECYSTECTOMY     CORONARY ANGIOPLASTY WITH STENT PLACEMENT     CORONARY STENT INTERVENTION N/A 08/25/2021   Procedure: CORONARY STENT INTERVENTION;  Surgeon: Jettie Booze, MD;  Location: [facility];  Service: Cardiovascular;  Laterality: N/A;   INTRAVASCULAR ULTRASOUND/IVUS N/A 08/25/2021   Procedure: Intravascular Ultrasound/IVUS;  Surgeon: Jettie Booze, MD;  Location: [facility];  Service: Cardiovascular;  Laterality: N/A;   LEFT HEART CATH AND CORONARY ANGIOGRAPHY N/A 08/25/2021   Procedure: LEFT HEART CATH AND CORONARY ANGIOGRAPHY;  Surgeon: Jettie Booze, MD;  Location: [facility];  Service: Cardiovascular;  Laterality: N/A;   TONSILLECTOMY     Family History  Problem Relation Age of Onset   Pancreatic cancer Mother    Diabetes Mother    Heart attack Father    Diabetes Brother    Liver disease Brother        drinker   Hypertension Brother    Hypertension Brother    HIV/AIDS Brother    Breast cancer Maternal Aunt    Rectal cancer Neg Hx    Colon cancer Neg Hx    Esophageal cancer Neg Hx    Social History   Socioeconomic History   Marital status: Married    Spouse name: Not on file   Number of children: 1   Years of education: Not on file   Highest education level: Not on file  Occupational History   Occupation: retired  Tobacco Use   Smoking status: Former    Packs/day: 1.00    Years: 27.00    Total pack years: 27.00    Types: Cigarettes    Quit date: 05/29/1994    Years since quitting: 28.1   Smokeless tobacco: Never  Vaping Use   Vaping Use: Never used  Substance and  Sexual Activity   Alcohol use: No   Drug use: Never   Sexual activity: Yes    Birth control/protection: Post-menopausal, None  Other Topics Concern   Not on file  Social History Narrative   Not on file   Social Determinants of Health   Financial Resource Strain: Low Risk  (07/28/2022)   Overall Financial Resource Strain (CARDIA)    Difficulty of Paying Living Expenses: Not hard at all  Food Insecurity: No Food Insecurity (07/28/2022)   Hunger Vital Sign    Worried About Running Out of  Food in the Last Year: Never true    [place] in the Last Year: Never true  Transportation Needs: No Transportation Needs (07/28/2022)   PRAPARE - Hydrologist (Medical): No    Lack of Transportation (Non-Medical): No  Physical Activity: Insufficiently Active (07/28/2022)   Exercise Vital Sign    Days of Exercise per Week: 3 days    Minutes of Exercise per Session: 20 min  Stress: No Stress Concern Present (07/28/2022)   [place]    Feeling of Stress : Not at all  Social Connections: Moderately Integrated (07/28/2022)   Social Connection and Isolation Panel [NHANES]    Frequency of Communication with Friends and Family: Three times a week    Frequency of Social Gatherings with Friends and Family: Twice a week    Attends Religious Services: 1 to 4 times per year    Active Member of [employer] or Organizations: No    Attends Music therapist: Never    Marital Status: Married    Tobacco Counseling Counseling given: Not Answered   Clinical Intake:  Pre-visit preparation completed: No  Pain : No/denies pain     Nutritional Risks: None Diabetes: Yes CBG done?: No  How often do you need to have someone help you when you read instructions, pamphlets, or other written materials from your doctor or pharmacy?: 1 - Never  Diabetic?Nutrition Risk Assessment:  Has the patient had any N/V/D  within the last 2 months?  No  Does the patient have any non-healing wounds?  No  Has the patient had any unintentional weight loss or weight gain?  No   Diabetes:  Is the patient diabetic?  Yes  If diabetic, was a CBG obtained today?  No  Did the patient bring in their glucometer from home?  No  How often do you monitor your CBG's? Three times a days.   Financial Strains and Diabetes Management:  Are you having any financial strains with the device, your supplies or your medication? No .  Does the patient want to be seen by Chronic Care Management for management of their diabetes?  No  Would the patient like to be referred to a Nutritionist or for Diabetic Management?  No   Diabetic Exams:  Diabetic Eye Exam: Completed 02/20/2022 Diabetic Foot Exam: Completed 10/31/2021    Interpreter Needed?: No      Activities of Daily Living    07/28/2022   10:55 AM 07/24/2022    9:01 AM  In your present state of health, do you have any difficulty performing the following activities:  Hearing? 0 0  Vision? 0 0  Difficulty concentrating or making decisions? 0 0  Walking or climbing stairs? 0 0  Dressing or bathing? 0 0  Doing errands, shopping? 0 0  Preparing Food and eating ? N N  Using the Toilet? N N  In the past six months, have you accidently leaked urine? Y N  Do you have problems with loss of bowel control? N N  Managing your Medications? N N  Managing your Finances? N N  Housekeeping or managing your Housekeeping? N N    Patient Care Team: [name], MD as PCP - General (Internal Medicine) Buford Dresser, MD as PCP - Cardiology (Cardiology)  Indicate any recent Medical Services you may have received from other than [facility] providers in the past year (date may be approximate).  Assessment:   This is a routine wellness examination for [place].  Hearing/Vision screen No results found.  Dietary issues and exercise activities discussed: Current Exercise  Habits: The patient does not participate in regular exercise at present, Exercise limited by: None identified   Goals Addressed   None    Depression Screen    07/28/2022   11:00 AM 07/28/2022   10:59 AM 07/10/2022   11:31 AM 07/10/2022   11:30 AM 10/31/2021    1:44 PM 07/26/2021   10:22 AM 02/04/2021    9:48 AM  PHQ 2/9 Scores  PHQ - 2 Score 0 0 0 0 1 0 0  PHQ- 9 Score  0 4  5      Fall Risk    07/28/2022   11:00 AM 07/24/2022    9:01 AM 03/02/2022    1:39 PM 07/26/2021   10:22 AM 07/20/2021    1:19 PM  Fall Risk   Falls in the past year? 0 0 0 0 0  Number falls in past yr: 0  0 0   Injury with Fall? 0  0 0 0  Risk for fall due to : No Fall Risks  No Fall Risks No Fall Risks   Follow up Falls prevention discussed  Falls evaluation completed      FALL RISK PREVENTION PERTAINING TO THE HOME:  Any stairs in or around the home? No  If so, are there any without handrails? No  Home free of loose throw rugs in walkways, pet beds, electrical cords, etc? Yes  Adequate lighting in your home to reduce risk of falls? Yes   ASSISTIVE DEVICES UTILIZED TO PREVENT FALLS:  Life alert? No  Use of a cane, walker or w/c? No  Grab bars in the bathroom? No  Shower chair or bench in shower? No  Elevated toilet seat or a handicapped toilet? No   TIMED UP AND GO:  Was the test performed? No .  Length of time to ambulate 10 feet: N/A sec.   Gait steady and fast without use of assistive device  Cognitive Function:    07/28/2022   11:00 AM 07/26/2021   10:25 AM  MMSE - Mini Mental State Exam  Orientation to time 5 5  Orientation to Place 5 5  Registration 3 3  Attention/ Calculation 5 5  Recall 3 3  Language- name 2 objects 2 2  Language- repeat 1 1  Language- follow 3 step command 3 3  Language- read & follow direction 1 1  Write a sentence 1 1  Copy design 1 1  Total score 30 30        07/28/2022   11:01 AM  6CIT Screen  What Year? 0 points  What month? 0 points  What time? 0  points  Count back from 20 0 points  Months in reverse 0 points  Repeat phrase 0 points  Total Score 0 points    Immunizations Immunization History  Administered Date(s) Administered   Influenza,inj,Quad PF,6+ Mos 02/08/2018, 04/10/2019, 02/04/2020, 02/04/2021   Influenza-Unspecified 02/06/2022   PFIZER(Purple Top)SARS-COV-2 Vaccination 07/26/2019, 08/16/2019, 06/05/2020   PNEUMOCOCCAL CONJUGATE-20 06/10/2021   Pneumococcal Polysaccharide-23 08/23/2017   Tdap 10/24/2016   Zoster Recombinat (Shingrix) 06/10/2021, 11/02/2021    TDAP status: Up to date  Flu Vaccine status: Up to date  Pneumococcal vaccine status: Up to date  Covid-19 vaccine status: Completed vaccines  Qualifies for Shingles Vaccine? Yes   Zostavax completed No   Shingrix Completed?: Yes  Screening Tests Health Maintenance  Topic Date Due   COVID-19 Vaccine (4 - 2023-24 season) 01/27/2022   Medicare Annual Wellness (AWV)  07/26/2022   Diabetic kidney evaluation - eGFR measurement  08/27/2022   FOOT EXAM  11/01/2022   MAMMOGRAM  11/19/2022   HEMOGLOBIN A1C  01/08/2023   OPHTHALMOLOGY EXAM  02/21/2023   Diabetic kidney evaluation - Urine ACR  03/03/2023   DTaP/Tdap/Td (2 - Td or Tdap) 10/25/2026   COLONOSCOPY (Pts 45-65yr Insurance coverage will need to be confirmed)  11/27/2030   Pneumonia Vaccine 67 Years old  Completed   INFLUENZA VACCINE  Completed   DEXA SCAN  Completed   Hepatitis C Screening  Completed   Zoster Vaccines- Shingrix  Completed   HPV VACCINES  Aged Out    Health Maintenance  Health Maintenance Due  Topic Date Due   COVID-19 Vaccine (4 - 2023-24 season) 01/27/2022   Medicare Annual Wellness (AWV)  07/26/2022   Diabetic kidney evaluation - eGFR measurement  08/27/2022    Colorectal cancer screening: Type of screening: Colonoscopy. Completed 11/26/2020. Repeat every 10 years  Mammogram status: Completed 11/18/2020. Repeat every year  Bone Density status: Completed  03/25/2016. Results reflect: Bone density results: NORMAL. Repeat every 2 years.  Lung Cancer Screening: (Low Dose CT Chest recommended if Age 67-80years, 30 pack-year currently smoking OR have quit w/in 15years.) does not qualify.   Lung Cancer Screening Referral: N/A  Additional Screening:  Hepatitis C Screening: does qualify; Completed 02/12/2014  Vision Screening: Recommended annual ophthalmology exams for early detection of glaucoma and other disorders of the eye. Is the patient up to date with their annual eye exam?  Yes  Who is the provider or what is the name of the office in which the patient attends annual eye exams? [facility]. If pt is not established with a provider, would they like to be referred to a provider to establish care? No .   Dental Screening: Recommended annual dental exams for proper oral hygiene  Community Resource Referral / Chronic Care Management: CRR required this visit?  No   CCM required this visit?  No      Plan:     I have personally reviewed and noted the following in the patient's chart:   Medical and social history Use of alcohol, tobacco or illicit drugs  Current medications and supplements including opioid prescriptions. Patient is not currently taking opioid prescriptions. Functional ability and status Nutritional status Physical activity Advanced directives List of other physicians Hospitalizations, surgeries, and ER visits in previous 12 months Vitals Screenings to include cognitive, depression, and falls Referrals and appointments  In addition, I have reviewed and discussed with patient certain preventive protocols, quality metrics, and best practice recommendations. A written personalized care plan for preventive services as well as general preventive health recommendations were provided to patient.     AGomez Cleverly CHenning  07/28/2022   Nurse Notes: I spent 30 minutes on this telephone encounter AVS mailed to  patinet

## 2022-08-13 DIAGNOSIS — G4733 Obstructive sleep apnea (adult) (pediatric): Secondary | ICD-10-CM | POA: Diagnosis not present

## 2022-08-14 ENCOUNTER — Encounter (HOSPITAL_BASED_OUTPATIENT_CLINIC_OR_DEPARTMENT_OTHER): Payer: Self-pay

## 2022-08-14 ENCOUNTER — Other Ambulatory Visit: Payer: Self-pay

## 2022-08-14 MED ORDER — METOPROLOL SUCCINATE ER 100 MG PO TB24: 100.0000 mg | ORAL_TABLET | Freq: Every day | ORAL | 1 refills | Status: DC

## 2022-08-14 MED ORDER — METOPROLOL SUCCINATE ER 50 MG PO TB24: 50.0000 mg | ORAL_TABLET | Freq: Every day | ORAL | 1 refills | Status: DC

## 2022-08-14 NOTE — Addendum Note (Signed)
Addended by: Carter Kitten D on: 08/14/2022 10:36 AM   Modules accepted: Orders

## 2022-08-17 DIAGNOSIS — I252 Old myocardial infarction: Secondary | ICD-10-CM | POA: Diagnosis not present

## 2022-08-17 DIAGNOSIS — E785 Hyperlipidemia, unspecified: Secondary | ICD-10-CM | POA: Diagnosis not present

## 2022-08-17 DIAGNOSIS — E119 Type 2 diabetes mellitus without complications: Secondary | ICD-10-CM | POA: Diagnosis not present

## 2022-08-17 DIAGNOSIS — I2510 Atherosclerotic heart disease of native coronary artery without angina pectoris: Secondary | ICD-10-CM | POA: Diagnosis not present

## 2022-08-17 DIAGNOSIS — I4891 Unspecified atrial fibrillation: Secondary | ICD-10-CM | POA: Diagnosis not present

## 2022-08-17 DIAGNOSIS — N393 Stress incontinence (female) (male): Secondary | ICD-10-CM | POA: Diagnosis not present

## 2022-08-17 DIAGNOSIS — E669 Obesity, unspecified: Secondary | ICD-10-CM | POA: Diagnosis not present

## 2022-08-17 DIAGNOSIS — I10 Essential (primary) hypertension: Secondary | ICD-10-CM | POA: Diagnosis not present

## 2022-08-17 DIAGNOSIS — D6869 Other thrombophilia: Secondary | ICD-10-CM | POA: Diagnosis not present

## 2022-08-21 ENCOUNTER — Ambulatory Visit: Payer: Medicare PPO | Attending: Internal Medicine | Admitting: Pharmacist

## 2022-08-21 ENCOUNTER — Encounter: Payer: Self-pay | Admitting: Pharmacist

## 2022-08-21 VITALS — BP 122/69 | HR 73

## 2022-08-21 DIAGNOSIS — E1159 Type 2 diabetes mellitus with other circulatory complications: Secondary | ICD-10-CM | POA: Diagnosis not present

## 2022-08-21 DIAGNOSIS — I152 Hypertension secondary to endocrine disorders: Secondary | ICD-10-CM | POA: Diagnosis not present

## 2022-08-21 NOTE — Progress Notes (Signed)
S:    PCP: Dr. Ezekiel Slocumb  67 y.o. female who presents for hypertension evaluation, education, and management. PMH is significant for CVA, NSTEMI, HTN, HLD, T2DM, TIA, CAD with multiple stents. Patient was referred and last seen by Primary Care Provider, Dr. Wynetta Emery, on 07/10/2022.   At last visit, BP in clinic was 143/78 mmHg; however, patient reported checking her BP at home multiple times per day and reported home readings of 120s/60s.  Today, patient arrives in good spirits and presents without assistance. Denies dizziness, headache, blurred vision, swelling. Clinic BP 122/69 mmHg. Home [place] BP cuff shows 132/68 mmHg. She states her BP was elevated at her visit with Dr. Wynetta Emery because her husband had messaged her that their car died in the parking lot.   Patient reports hypertension was diagnosed in >10 years ago.   Family/Social history:  ASCVD: personal h/o NSTEMI, TIA, and CAD, dad died of massive heart attack CKD: no personal, brother had kidney transplant Smoker: former (quit >30 years ago) Alcohol: none  Medication adherence reported. Patient took BP medications last night.   Current antihypertensives include: amlodipine 10 mg daily, losartan 100 mg daily, metoprolol succinate 100 mg QAM and 50 mg QPM, HCTZ 25 mg daily  Antihypertensives tried in the past include: atenolol, irbesartan, valsartan, valsartan-HCTZ  Reported home BP readings (checks 3-5x/day): 132/73, 114/69, 114/71, 124/64, 129/69, 114/68, 142/75, 125/65  -runs 140/70s in the afternoon. She does report she does not rest for 5 mins before checking her BP in the afternoon which can sometimes cause high BP  Patient reported dietary habits:  -Tries to limit salt intake   Patient-reported exercise habits: walks 3x/week for 15 mins. Limited by foot pain, occasionally has to wear boot   O:  Vitals:   08/21/22 1036  BP: 122/69  Pulse: 73    Last 3 Office BP readings: BP Readings from Last 3 Encounters:   07/10/22 (!) 143/78  06/20/22 120/64  03/10/22 130/75   BMET    Component Value Date/Time   NA 134 (L) 08/26/2021 0229   NA 141 09/20/2020 0854   K 3.7 08/26/2021 0229   CL 100 08/26/2021 0229   CO2 26 08/26/2021 0229   GLUCOSE 125 (H) 08/26/2021 0229   BUN 17 08/26/2021 0229   BUN 24 09/20/2020 0854   CREATININE 1.11 (H) 08/26/2021 0229   CALCIUM 9.2 08/26/2021 0229   GFRNONAA 55 (L) 08/26/2021 0229   GFRAA 70 07/25/2019 1524    Renal function: CrCl cannot be calculated (Patient's most recent lab result is older than the maximum 21 days allowed.).  Clinical ASCVD: Yes  The ASCVD Risk score (Arnett DK, et al., 2019) failed to calculate for the following reasons:   The patient has a prior MI or stroke diagnosis   A/P: Hypertension longstanding, currently controlled based on home and office BP. Patient reported last office BP was elevated in the setting of her husband messaging her that their car died in the parking lot. BP goal < 130/80 mmHg. Medication adherence appears appropriate. -Continued current therapy.  -F/u labs ordered - none -Counseled on lifestyle modifications for blood pressure control including reduced dietary sodium, increased exercise, adequate sleep. -Encouraged patient to check BP at home and bring log of readings to next visit. Counseled on proper use of home BP cuff. Discussed allowing 5 minutes of rest before checking BP in the afternoon to avoid falsely elevated readings.   Results reviewed and written information provided.    Written patient instructions provided.  Patient verbalized understanding of treatment plan.  Total time in face to face counseling 20 minutes.    Follow-up:  Pharmacist PRN. PCP clinic visit in June 2024.   Joseph Art, Pharm.D. PGY-2 Ambulatory Care Pharmacy Resident

## 2022-09-05 ENCOUNTER — Encounter: Payer: Self-pay | Admitting: Internal Medicine

## 2022-09-06 ENCOUNTER — Telehealth: Payer: Self-pay | Admitting: Internal Medicine

## 2022-09-06 ENCOUNTER — Other Ambulatory Visit: Payer: Self-pay | Admitting: Internal Medicine

## 2022-09-06 ENCOUNTER — Other Ambulatory Visit: Payer: Self-pay

## 2022-09-06 DIAGNOSIS — E1159 Type 2 diabetes mellitus with other circulatory complications: Secondary | ICD-10-CM

## 2022-09-06 MED ORDER — DAPAGLIFLOZIN PROPANEDIOL 10 MG PO TABS: 10.0000 mg | ORAL_TABLET | Freq: Every day | ORAL | 3 refills | Status: DC

## 2022-09-06 NOTE — Telephone Encounter (Signed)
-----   Message from Weldon Picking, CPhT sent at 09/06/2022 11:34 AM EDT ----- Patient is re-enrolled in the program until 05/29/2023. A refill can be escribed to [facility] in [place], [place] or faxed manually to the number she provided. The program prefers to dispense a 90 day supply at a time. If you put additional refills than the company will also put the refills on auto-refill. ----- Message ----- From: Marcine Matar, MD Sent: 09/06/2022  11:03 AM EDT To: Weldon Picking, CPhT  Does this pt get her [place] through pt assistance program with [facility] through [place]?  She sent me a Mychart message requesting that prescription be faxed to them.

## 2022-09-13 DIAGNOSIS — G4733 Obstructive sleep apnea (adult) (pediatric): Secondary | ICD-10-CM | POA: Diagnosis not present

## 2022-09-13 MED ORDER — TICAGRELOR 90 MG PO TABS: 90.0000 mg | ORAL_TABLET | Freq: Two times a day (BID) | ORAL | 0 refills | Status: AC

## 2022-09-14 ENCOUNTER — Telehealth (HOSPITAL_BASED_OUTPATIENT_CLINIC_OR_DEPARTMENT_OTHER): Payer: Self-pay | Admitting: *Deleted

## 2022-09-14 NOTE — Telephone Encounter (Signed)
Received notification Brillinta approved 09/13/2022-09/13/2023, patient aware

## 2022-10-02 NOTE — Progress Notes (Signed)
Cardiology Office Note:    Date:  10/05/2022   ID:  Breanna Obrien, DOB 1956/03/09, MRN [id number]  PCP:  Marcine Matar, MD  Cardiologist:  Jodelle Red, MD  Referring MD: Marcine Matar, MD   CC:  Follow-up  History of Present Illness:    Breanna Obrien is a 67 y.o. female with a hx of CAD s/p DES to LAD x 2 ( 2008 in [place]) and NSTEMI s/p mid RCA (2023), CVA (09/2016), Bell's palsy, bilateral carotid artery stenosis, hypertension, diabetes type II, who is here for follow up today.  On 09/07/21 she was seen by Robin Searing, NP for post hospital follow-up of CAD s/p NSTEMI. She had presented to the ED 08/23/21 and was found to have NSTEMI vs ACS; she was transferred to [facility]. High sensitivity troponins were 18>>48>>39>>26 and EKG showed ST and minimal ST depression. Left heart cath was completed and revealed 90% stenosed mid RCA that was treated with DES x1. Patient was discharged on DAPT with Brilinta and ASA 81 mg, and long-term monitor to evaluate for palpitations. At her appointment with Robin Searing, NP she noted increased palpitations at rest and with activity. Her EKG showed sinus rhythm and nonspecific T wave abnormality, with rate of 95- no acute changes. Atenolol was switched to Toprol 100 mg QD. She was cleared for cardiac rehab.  On 09/21/21 she messaged the office and provided a blood pressure log, which was personally reviewed.  Cardiovascular risk factors: Prior clinical ASCVD: CAD s/p DES to LAD x 2 ( 2008 in [place]) and NSTEMI s/p mid RCA (07/2021), CVA (09/2016). Comorbid conditions: Recently her blood pressure has been improving at home. Although, in the afternoons from 3-6 PM she has noticed higher readings, sometimes in the 140's. Prior cardiac testing and/or incidental findings on other testing (ie coronary calcium): Exercise level: Lately she is doing well exercising 4-5 days a week for 15 minutes. Also will care for her 24 month old grandson. Current  diet: At times she has noticed elevated heart rates when her blood sugar is low. She drinks coffee with creamer and splenda.  At her initial appointment she reported CPAP intolerance and felt she would feel claustrophobic if she tried a full mask. Her blood pressures were improving and at goal in clinic.  Today, she is feeling pretty good. She was in [place] last week to see her grandchildren, did a lot of walking at [employer]. At other times she completed a 1/2 mile trail walk, and 3/4 mile to a [employer]. No concerning symptoms, which was strange to her as she seems to be more fatigued at home. She continues to walk routinely at home. However, she feels more fatigued and worn out when walking in her neighborhood. Her main limiting factor is her fatigue/exhaustion. Usually she tries to force herself to walk and push through. Additionally she does note some boredom with walking through her neighborhood every time.  She denies any palpitations, chest pain, shortness of breath, or peripheral edema. No lightheadedness, headaches, syncope, orthopnea, or PND.   Past Medical History:  Diagnosis Date   Allergy    Arthritis    Bell's palsy    CAD (coronary artery disease)    Depression    Diabetes mellitus without complication (HCC)    Gallstones    Heart disease    HLD (hyperlipidemia)    Hypertension    Mini stroke    Stroke (HCC)    Tia Mini Stroke   TIA (transient ischemic  attack) 06/30/2016    Past Surgical History:  Procedure Laterality Date   CHOLECYSTECTOMY     CORONARY ANGIOPLASTY WITH STENT PLACEMENT     CORONARY STENT INTERVENTION N/A 08/25/2021   Procedure: CORONARY STENT INTERVENTION;  Surgeon: Corky Crafts, MD;  Location: [facility] INVASIVE CV LAB;  Service: Cardiovascular;  Laterality: N/A;   CORONARY ULTRASOUND/IVUS N/A 08/25/2021   Procedure: Intravascular Ultrasound/IVUS;  Surgeon: Corky Crafts, MD;  Location: [facility] INVASIVE CV LAB;  Service: Cardiovascular;   Laterality: N/A;   LEFT HEART CATH AND CORONARY ANGIOGRAPHY N/A 08/25/2021   Procedure: LEFT HEART CATH AND CORONARY ANGIOGRAPHY;  Surgeon: Corky Crafts, MD;  Location: [facility] INVASIVE CV LAB;  Service: Cardiovascular;  Laterality: N/A;   TONSILLECTOMY      Current Medications: Current Outpatient Medications on File Prior to Visit  Medication Sig   ALPHA LIPOIC ACID PO Take 1 tablet by mouth daily. With green tea   amLODipine (NORVASC) 10 MG tablet TAKE 1 TABLET (10 MG TOTAL) BY MOUTH DAILY.   Ascorbic Acid (VITAMIN C PO) Take 1 tablet by mouth daily.   aspirin 81 MG chewable tablet Chew 1 tablet (81 mg total) by mouth daily.   atorvastatin (LIPITOR) 40 MG tablet Take 1 tablet (40 mg total) by mouth at bedtime.   calcium-vitamin D (OSCAL WITH D) 500-200 MG-UNIT tablet Take 1 tablet by mouth.   CHROMIUM PO Take 1 tablet by mouth daily.   dapagliflozin propanediol (FARXIGA) 10 MG TABS tablet Take 1 tablet (10 mg total) by mouth daily.   ezetimibe (ZETIA) 10 MG tablet Take 1 tablet (10 mg total) by mouth daily. Please keep schedule appointment with cardiologist   Fish Oil-Cholecalciferol (FISH OIL + D3 PO) Take 1 tablet by mouth daily.   glipiZIDE (GLUCOTROL) 10 MG tablet Take 0.5 tablets (5 mg total) by mouth 2 (two) times daily before a meal.   hydrochlorothiazide (HYDRODIURIL) 25 MG tablet Take 1 tablet (25 mg total) by mouth daily.   insulin glargine (LANTUS) 100 UNIT/ML injection Inject 0.27 mLs (27 Units total) into the skin at bedtime.   Insulin Syringe-Needle U-100 (TRUEPLUS INSULIN SYRINGE) 30G X 5/16" 0.5 ML MISC UAD   losartan (COZAAR) 100 MG tablet TAKE 1 TABLET (100 MG TOTAL) BY MOUTH DAILY.   MAGNESIUM PO Take 1 tablet by mouth daily.   metFORMIN (GLUCOPHAGE) 1000 MG tablet TAKE 1 TABLET (1,000 MG TOTAL) BY MOUTH 2 (TWO) TIMES DAILY.   metoprolol succinate (TOPROL-XL) 100 MG 24 hr tablet Take 1 tablet (100 mg total) by mouth daily. Take with or immediately following a meal.    metoprolol succinate (TOPROL-XL) 50 MG 24 hr tablet Take 1 tablet (50 mg total) by mouth daily. Take with or immediately following a meal, take in the evening.   Multiple Vitamin (MULTIVITAMIN PO) Take 1 tablet by mouth daily.   nitroGLYCERIN (NITROSTAT) 0.4 MG SL tablet Place 1 tablet (0.4 mg total) under the tongue every 5 (five) minutes x 3 doses as needed for chest pain.   ticagrelor (BRILINTA) 90 MG TABS tablet Take 1 tablet (90 mg total) by mouth 2 (two) times daily.   No current facility-administered medications on file prior to visit.     Allergies:   Trazodone and nefazodone   Social History   Tobacco Use   Smoking status: Former    Packs/day: 1.00    Years: 27.00    Additional pack years: 0.00    Total pack years: 27.00    Types: Cigarettes  Quit date: 05/29/1994    Years since quitting: 28.3   Smokeless tobacco: Never  Vaping Use   Vaping Use: Never used  Substance Use Topics   Alcohol use: No   Drug use: Never    Family History: family history includes Breast cancer in her maternal aunt; Diabetes in her brother and mother; HIV/AIDS in her brother; Heart attack in her father; Hypertension in her brother and brother; Liver disease in her brother; Pancreatic cancer in her mother. There is no history of Rectal cancer, Colon cancer, or Esophageal cancer.  ROS:   Please see the history of present illness. (+) Fatigue All other systems are reviewed and negative.    EKGs/Labs/Other Studies Reviewed:    The following studies were reviewed today:  ABI Doppler  11/08/2021: Summary:  Right: Resting right ankle-brachial index indicates mild right lower  extremity arterial disease. The right toe-brachial index is abnormal.   Left: Resting left ankle-brachial index is within normal range. No  evidence of significant left lower extremity arterial disease. The left  toe-brachial index is normal.   Monitor  09/2021: The basic underlying rhythm is normal sinus rhythm.  Average heart rate 66 bpm. Frequent sinus tachycardia unprovoked by physical activity. Maximal heart rate 145 bpm and associated with sensation of palpitations. Mobitz 1 second-degree heart block during sleep noted on 1 occasion. Supraventricular ectopic burden 1.8%. Ventricular ectopic burden less than 1%. No PSVT or atrial fibrillation noted.     Patch Wear Time:  14 days and 0 hours (2023-04-04T10:20:55-0400 to 2023-04-18T10:20:59-0400)   Patient had a min HR of 66 bpm, max HR of 145 bpm, and avg HR of 98 bpm. Predominant underlying rhythm was Sinus Rhythm. First Degree AV Block was present. Isolated SVEs were occasional (1.8%, 34453), SVE Triplets were rare (<1.0%, 156), and no SVE  Couplets were present. Isolated VEs were rare (<1.0%), VE Couplets were rare (<1.0%), and no VE Triplets were present. Ventricular Bigeminy and Trigeminy were present.  Left Heart Catheterization  08/25/2021:   Dist LM lesion is 25% stenosed.   Previously placed Mid LAD stent (unknown type) is  widely patent.   Mid RCA lesion is 90% stenosed.  In-stent restenosis from the 2008 Taxus stent.Scoring balloon angioplasty was performed using a BALLN SCOREFLEX 2.50X10.  Postdilatation with a 3.25 [place] balloon.   Post intervention, there is a 0% residual stenosis.   Prox RCA-2 lesion is 75% stenosed.  Prox RCA-1 lesion is 80% stenosed.   A drug-eluting stent was successfully placed using a STENT ONYX FRONTIER 3.0X18. A drug-eluting stent was successfully placed using a STENT ONYX FRONTIER 3.0X12 x two.  Stented segment was optimized with intravascular ultrasound.   Post intervention, there is a 0% residual stenosis.   Continue aggressive secondary prevention.  Would continue dual antiplatelet therapy for at least 12 months.  If she did not have bleeding issues, would continue clopidogrel monotherapy long-term.  Diagnostic: Dominance: Right   Intervention:   Echocardiogram  08/24/2021:  1. Left ventricular ejection  fraction, by estimation, is 55%. The left  ventricle has normal function. The left ventricle has no regional wall  motion abnormalities. Diastolic function lower limit of normal for age.   2. Right ventricular systolic function is normal. The right ventricular  size is normal. There is normal pulmonary artery systolic pressure. The  estimated right ventricular systolic pressure is 26.4 mmHg.   3. The mitral valve is normal in structure. Trivial mitral valve  regurgitation. No evidence of mitral stenosis.  4. The aortic valve is grossly normal. Aortic valve regurgitation is not  visualized. No aortic stenosis is present.   5. The inferior vena cava is normal in size with greater than 50%  respiratory variability, suggesting right atrial pressure of 3 mmHg.    EKG:  EKG is personally reviewed.   10/05/2022:  NSR at 75 bpm 12/19/2021: not ordered  Recent Labs: No results found for requested labs within last 365 days.   Recent Lipid Panel    Component Value Date/Time   CHOL 130 12/19/2021 1040   TRIG 111 12/19/2021 1040   HDL 43 12/19/2021 1040   CHOLHDL 3.0 12/19/2021 1040   CHOLHDL 5.1 10/06/2016 0333   VLDL 31 10/06/2016 0333   LDLCALC 67 12/19/2021 1040    Physical Exam:    VS:  BP 132/70   Pulse 75   Ht 5\' 3"  (1.6 m)   Wt 183 lb 12.8 oz (83.4 kg)   SpO2 98%   BMI 32.56 kg/m     Wt Readings from Last 3 Encounters:  10/05/22 183 lb 12.8 oz (83.4 kg)  07/10/22 184 lb (83.5 kg)  06/20/22 175 lb (79.4 kg)    GEN: Well nourished, well developed in no acute distress HEENT: Normal, moist mucous membranes NECK: No JVD CARDIAC: regular rhythm, normal S1 and S2, no rubs or gallops. No murmur. VASCULAR: Radial and DP pulses 2+ bilaterally. No carotid bruits RESPIRATORY:  Clear to auscultation without rales, wheezing or rhonchi  ABDOMEN: Soft, non-tender, non-distended MUSCULOSKELETAL:  Ambulates independently SKIN: Warm and dry, no edema. NEUROLOGIC:  Alert and oriented x  3. Bells palsy L side PSYCHIATRIC:  Normal affect    ASSESSMENT:    1. CAD S/P percutaneous coronary angioplasty   2. Mixed hyperlipidemia   3. History of CVA (cerebrovascular accident)   4. Type 2 diabetes mellitus with other circulatory complication, with long-term current use of insulin (HCC)   5. History of non-ST elevation myocardial infarction (NSTEMI)   6. Essential hypertension   7. Cardiac risk counseling     PLAN:    CAD s/p PCI History of NSTEMI History of CVA Mixed hyperlipidemia -continue aspirin and ticagrelor. Has stent in stent -continue atorvastatin. Update lipids. Would aim for aggressive control with LDL <55 -continue metoprolol -check lpa  Hypertension -near goal today -continue amlodipine, HCTZ, metoprolol  Type II diabetes -on metformin, insulin, glipizide -on dapagliflozin  Cardiac risk counseling and prevention recommendations: -recommend heart healthy/Mediterranean diet, with whole grains, fruits, vegetable, fish, lean meats, nuts, and olive oil. Limit salt. -recommend moderate walking, 3-5 times/week for 30-50 minutes each session. Aim for at least 150 minutes.week. Goal should be pace of 3 miles/hours, or walking 1.5 miles in 30 minutes -recommend avoidance of tobacco products. Avoid excess alcohol.  Plan for follow up: 6 months or sooner as needed.  Jodelle Red, MD, PhD, [facility]  [facility]    Medication Adjustments/Labs and Tests Ordered: Current medicines are reviewed at length with the patient today.  Concerns regarding medicines are outlined above.   Orders Placed This Encounter  Procedures   Lipoprotein A (LPA)   EKG 12-Lead   No orders of the defined types were placed in this encounter.  Patient Instructions  Medication Instructions:  Your physician recommends that you continue on your current medications as directed. Please refer to the Current Medication list given to you today.   *If you need a  refill on your cardiac medications before your next appointment, please call  your pharmacy*  Lab Work: LPa when you have your labs done at primary care in June  If you have labs (blood work) drawn today and your tests are completely normal, you will receive your results only by: MyChart Message (if you have MyChart) OR A paper copy in the mail If you have any lab test that is abnormal or we need to change your treatment, we will call you to review the results.  Testing/Procedures: NONE   Follow-Up: At [facility], you and your health needs are our priority.  As part of our continuing mission to provide you with exceptional heart care, we have created designated Provider Care Teams.  These Care Teams include your primary Cardiologist (physician) and Advanced Practice Providers (APPs -  Physician Assistants and Nurse Practitioners) who all work together to provide you with the care you need, when you need it.  We recommend signing up for the patient portal called "MyChart".  Sign up information is provided on this After Visit Summary.  MyChart is used to connect with patients for Virtual Visits (Telemedicine).  Patients are able to view lab/test results, encounter notes, upcoming appointments, etc.  Non-urgent messages can be sent to your provider as well.   To learn more about what you can do with MyChart, go to [email or web address].    Your next appointment:   6 month(s)  Provider:   Jodelle Red, MD       [facility],acting as a scribe for Jodelle Red, MD.,have documented all relevant documentation on the behalf of Jodelle Red, MD,as directed by  Jodelle Red, MD while in the presence of Jodelle Red, MD.  I, Jodelle Red, MD, have reviewed all documentation for this visit. The documentation on 10/05/22 for the exam, diagnosis, procedures, and orders are all accurate and complete.   Signed, Jodelle Red, MD PhD 10/05/2022     [facility]

## 2022-10-05 ENCOUNTER — Ambulatory Visit (HOSPITAL_BASED_OUTPATIENT_CLINIC_OR_DEPARTMENT_OTHER): Payer: Medicare PPO | Admitting: Cardiology

## 2022-10-05 ENCOUNTER — Encounter (HOSPITAL_BASED_OUTPATIENT_CLINIC_OR_DEPARTMENT_OTHER): Payer: Self-pay | Admitting: Cardiology

## 2022-10-05 VITALS — BP 132/70 | HR 75 | Ht 63.0 in | Wt 183.8 lb

## 2022-10-05 DIAGNOSIS — Z7189 Other specified counseling: Secondary | ICD-10-CM | POA: Diagnosis not present

## 2022-10-05 DIAGNOSIS — E782 Mixed hyperlipidemia: Secondary | ICD-10-CM

## 2022-10-05 DIAGNOSIS — E1159 Type 2 diabetes mellitus with other circulatory complications: Secondary | ICD-10-CM

## 2022-10-05 DIAGNOSIS — I252 Old myocardial infarction: Secondary | ICD-10-CM

## 2022-10-05 DIAGNOSIS — Z794 Long term (current) use of insulin: Secondary | ICD-10-CM

## 2022-10-05 DIAGNOSIS — I10 Essential (primary) hypertension: Secondary | ICD-10-CM | POA: Diagnosis not present

## 2022-10-05 DIAGNOSIS — Z8673 Personal history of transient ischemic attack (TIA), and cerebral infarction without residual deficits: Secondary | ICD-10-CM

## 2022-10-05 DIAGNOSIS — I2510 Atherosclerotic heart disease of native coronary artery without angina pectoris: Secondary | ICD-10-CM | POA: Diagnosis not present

## 2022-10-05 DIAGNOSIS — Z9861 Coronary angioplasty status: Secondary | ICD-10-CM

## 2022-10-05 NOTE — Patient Instructions (Signed)
Medication Instructions:  Your physician recommends that you continue on your current medications as directed. Please refer to the Current Medication list given to you today.   *If you need a refill on your cardiac medications before your next appointment, please call your pharmacy*  Lab Work: LPa when you have your labs done at primary care in June  If you have labs (blood work) drawn today and your tests are completely normal, you will receive your results only by: MyChart Message (if you have MyChart) OR A paper copy in the mail If you have any lab test that is abnormal or we need to change your treatment, we will call you to review the results.  Testing/Procedures: NONE   Follow-Up: At [facility], you and your health needs are our priority.  As part of our continuing mission to provide you with exceptional heart care, we have created designated Provider Care Teams.  These Care Teams include your primary Cardiologist (physician) and Advanced Practice Providers (APPs -  Physician Assistants and Nurse Practitioners) who all work together to provide you with the care you need, when you need it.  We recommend signing up for the patient portal called "MyChart".  Sign up information is provided on this After Visit Summary.  MyChart is used to connect with patients for Virtual Visits (Telemedicine).  Patients are able to view lab/test results, encounter notes, upcoming appointments, etc.  Non-urgent messages can be sent to your provider as well.   To learn more about what you can do with MyChart, go to [email or web address].    Your next appointment:   6 month(s)  Provider:   Jodelle Red, MD

## 2022-10-13 DIAGNOSIS — G4733 Obstructive sleep apnea (adult) (pediatric): Secondary | ICD-10-CM | POA: Diagnosis not present

## 2022-11-08 ENCOUNTER — Telehealth (HOSPITAL_BASED_OUTPATIENT_CLINIC_OR_DEPARTMENT_OTHER): Payer: Self-pay | Admitting: Cardiology

## 2022-11-08 MED ORDER — TICAGRELOR 90 MG PO TABS: 90.0000 mg | ORAL_TABLET | Freq: Two times a day (BID) | ORAL | 3 refills | Status: DC

## 2022-11-08 NOTE — Telephone Encounter (Signed)
  Pt c/o medication issue:  1. Name of Medication: ticagrelor (BRILINTA) 90 MG TABS tablet   2. How are you currently taking this medication (dosage and times per day)?   Take 1 tablet (90 mg total) by mouth 2 (two) times daily.    3. Are you having a reaction (difficulty breathing--STAT)? No   4. What is your medication issue? Gregg from [facility] reached out, mentioning that typically Brilinta is taken as two tablets a day, but the prescription specifies 90 mg once a day. They require clarification on this matter. He gave order# [id number].

## 2022-11-08 NOTE — Telephone Encounter (Signed)
Spoke with [employer] and gave verbal Rx for Brilinta 90 mg twice a day #180 with 3 refills

## 2022-11-13 DIAGNOSIS — G4733 Obstructive sleep apnea (adult) (pediatric): Secondary | ICD-10-CM | POA: Diagnosis not present

## 2022-11-20 ENCOUNTER — Encounter: Payer: Self-pay | Admitting: Internal Medicine

## 2022-11-20 ENCOUNTER — Ambulatory Visit: Payer: Medicare PPO | Attending: Internal Medicine | Admitting: Internal Medicine

## 2022-11-20 VITALS — BP 135/76 | HR 69 | Temp 98.2°F | Ht 63.0 in | Wt 181.0 lb

## 2022-11-20 DIAGNOSIS — E785 Hyperlipidemia, unspecified: Secondary | ICD-10-CM

## 2022-11-20 DIAGNOSIS — Z794 Long term (current) use of insulin: Secondary | ICD-10-CM | POA: Diagnosis not present

## 2022-11-20 DIAGNOSIS — Z1231 Encounter for screening mammogram for malignant neoplasm of breast: Secondary | ICD-10-CM

## 2022-11-20 DIAGNOSIS — E1159 Type 2 diabetes mellitus with other circulatory complications: Secondary | ICD-10-CM

## 2022-11-20 DIAGNOSIS — R058 Other specified cough: Secondary | ICD-10-CM

## 2022-11-20 DIAGNOSIS — I152 Hypertension secondary to endocrine disorders: Secondary | ICD-10-CM

## 2022-11-20 DIAGNOSIS — E1169 Type 2 diabetes mellitus with other specified complication: Secondary | ICD-10-CM | POA: Diagnosis not present

## 2022-11-20 DIAGNOSIS — G4733 Obstructive sleep apnea (adult) (pediatric): Secondary | ICD-10-CM | POA: Diagnosis not present

## 2022-11-20 LAB — GLUCOSE, POCT (MANUAL RESULT ENTRY): POC Glucose: 150 mg/dl — AB (ref 70–99)

## 2022-11-20 LAB — POCT GLYCOSYLATED HEMOGLOBIN (HGB A1C): HbA1c, POC (controlled diabetic range): 7 % (ref 0.0–7.0)

## 2022-11-20 MED ORDER — AMLODIPINE BESYLATE 10 MG PO TABS
ORAL_TABLET | Freq: Every day | ORAL | 3 refills | Status: DC
Start: 2022-11-20 — End: 2024-03-12

## 2022-11-20 MED ORDER — HYDROCHLOROTHIAZIDE 25 MG PO TABS: 25.0000 mg | ORAL_TABLET | Freq: Every day | ORAL | 3 refills | Status: DC

## 2022-11-20 MED ORDER — INSULIN GLARGINE 100 UNIT/ML ~~LOC~~ SOLN: 27.0000 [IU] | Freq: Every day | SUBCUTANEOUS | 11 refills | Status: DC

## 2022-11-20 MED ORDER — GLIPIZIDE 10 MG PO TABS: 5.0000 mg | ORAL_TABLET | Freq: Two times a day (BID) | ORAL | 3 refills | Status: DC

## 2022-11-20 MED ORDER — METFORMIN HCL 1000 MG PO TABS
ORAL_TABLET | Freq: Two times a day (BID) | ORAL | 3 refills | Status: DC
Start: 2022-11-20 — End: 2023-12-09

## 2022-11-20 MED ORDER — ATORVASTATIN CALCIUM 40 MG PO TABS
40.0000 mg | ORAL_TABLET | Freq: Every day | ORAL | 3 refills | Status: DC
Start: 2022-11-20 — End: 2024-02-26

## 2022-11-20 NOTE — Progress Notes (Signed)
Patient ID: Lean Jaeger, female    DOB: 13-Sep-1955  MRN: [id number]  CC: Diabetes (DM f/u. Med refills. /Experiencing dry throat / coughing when using CPAP machine)   Subjective: Chyrl Elwell is a 67 y.o. female who presents for chronic ds management Her concerns today include:  left pontine CVA (09/2016), HTN, HL, diabetes type 2, BL carotid artery stenosis, and hx of CAD (stent x 2 per pt's old medical records, 3 stents to RCA 07/2021), OSA on CPAP through cardiology.   DM: Results for orders placed or performed in visit on 11/20/22  POCT glucose (manual entry)  Result Value Ref Range   POC Glucose 150 (A) 70 - 99 mg/dl  POCT glycosylated hemoglobin (Hb A1C)  Result Value Ref Range   Hemoglobin A1C     HbA1c POC (<> result, manual entry)     HbA1c, POC (prediabetic range)     HbA1c, POC (controlled diabetic range) 7.0 0.0 - 7.0 %  Reports compliance with meds:  Lantus 27 units daily, Farxiga 10 mg daily metformin 1 gram BID, Glucotrol 5 mg BID Walking more 4-5 days for 30 mins or more.  Almost at a mile.   Some dietary indiscretion over last mth; increase appetite after dinner.  Down 3 lbs.  Goal is to get to 170 lbs.   HYPERTENSION/CAD/HL Currently taking: see medication list.  Takes BP meds in evenings except the HCTZ and Metoprolol XL 100 mg in a.m. Other meds including Norvasc 10 mg, Cozaar 100 mg, Metoprolol XL 50 mg and Lipitor 40/Zetia 10 mg at nights, Brilinta and ASA -No recent use of sublingual nitroglycerin.  Reports dry cough occasionally which she associates with use of her CPAP machine.  She has a nasal mask.  Cannot tolerate facial mask.  She uses her CPAP consistently every night.  Feels she benefits from use for the most part.  She has issues with allergies and they are pretty bad at this time of year with sneezing, drainage and cough. 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 11/12/2020   Abnormal mammogram 01/15/2017   CAD S/P percutaneous coronary angioplasty 01/15/2017   Hx of Bell's palsy 01/15/2017   Insomnia due to medical condition 10/24/2016   Depression 10/24/2016   Type 2 diabetes mellitus with complication, without long-term current use of insulin (HCC) 10/06/2016   Essential hypertension 10/06/2016   Ataxia    Hyperlipidemia    Bilateral carotid artery stenosis    CVA (cerebral vascular accident) (HCC) 10/05/2016   TIA (transient ischemic attack) 06/30/2016     Current Outpatient Medications on File Prior to Visit  Medication Sig Dispense Refill   ALPHA LIPOIC ACID PO Take 1 tablet by mouth daily. With green tea     amLODipine (NORVASC) 10 MG tablet TAKE 1 TABLET (10 MG TOTAL) BY MOUTH DAILY. 90 tablet 3   Ascorbic Acid (VITAMIN C PO) Take 1 tablet by mouth daily.     aspirin 81 MG chewable tablet Chew 1 tablet (81 mg total) by mouth daily.     atorvastatin (LIPITOR) 40 MG tablet Take 1 tablet (40 mg total) by mouth at bedtime. 90 tablet 3   calcium-vitamin D (OSCAL  WITH D) 500-200 MG-UNIT tablet Take 1 tablet by mouth.     CHROMIUM PO Take 1 tablet by mouth daily.     dapagliflozin propanediol (FARXIGA) 10 MG TABS tablet Take 1 tablet (10 mg total) by mouth daily. 90 tablet 3   ezetimibe (ZETIA) 10 MG tablet Take 1 tablet (10 mg total) by mouth daily. Please keep schedule appointment with cardiologist 30 tablet 0   Fish Oil-Cholecalciferol (FISH OIL + D3 PO) Take 1 tablet by mouth daily.     glipiZIDE (GLUCOTROL) 10 MG tablet Take 0.5 tablets (5 mg total) by mouth 2 (two) times daily before a meal. 180 tablet 3   hydrochlorothiazide (HYDRODIURIL) 25 MG tablet Take 1 tablet  (25 mg total) by mouth daily. 90 tablet 3   insulin glargine (LANTUS) 100 UNIT/ML injection Inject 0.27 mLs (27 Units total) into the skin at bedtime. 30 mL 11   Insulin Syringe-Needle U-100 (TRUEPLUS INSULIN SYRINGE) 30G X 5/16" 0.5 ML MISC UAD 100 each 11   losartan (COZAAR) 100 MG tablet TAKE 1 TABLET (100 MG TOTAL) BY MOUTH DAILY. 90 tablet 3   MAGNESIUM PO Take 1 tablet by mouth daily.     metFORMIN (GLUCOPHAGE) 1000 MG tablet TAKE 1 TABLET (1,000 MG TOTAL) BY MOUTH 2 (TWO) TIMES DAILY. 180 tablet 3   metoprolol succinate (TOPROL-XL) 100 MG 24 hr tablet Take 1 tablet (100 mg total) by mouth daily. Take with or immediately following a meal. 90 tablet 1   metoprolol succinate (TOPROL-XL) 50 MG 24 hr tablet Take 1 tablet (50 mg total) by mouth daily. Take with or immediately following a meal, take in the evening. 90 tablet 1   Multiple Vitamin (MULTIVITAMIN PO) Take 1 tablet by mouth daily.     nitroGLYCERIN (NITROSTAT) 0.4 MG SL tablet Place 1 tablet (0.4 mg total) under the tongue every 5 (five) minutes x 3 doses as needed for chest pain. 25 tablet 1   ticagrelor (BRILINTA) 90 MG TABS tablet Take 1 tablet (90 mg total) by mouth 2 (two) times daily. 180 tablet 3   No current facility-administered medications on file prior to visit.    Allergies  Allergen Reactions   Trazodone And Nefazodone Other (See Comments)    Makes her feel jittery    Social History   Socioeconomic History   Marital status: Married    Spouse name: Not on file   Number of children: 1   Years of education: Not on file   Highest education level: GED or equivalent  Occupational History   Occupation: retired  Tobacco Use   Smoking status: Former    Packs/day: 1.00    Years: 27.00    Additional pack years: 0.00    Total pack years: 27.00    Types: Cigarettes    Quit date: 05/29/1994    Years since quitting: 28.4   Smokeless tobacco: Never  Vaping Use   Vaping Use: Never used  Substance and Sexual Activity    Alcohol use: No   Drug use: Never   Sexual activity: Yes    Birth control/protection: Post-menopausal, None  Other Topics Concern   Not on file  Social History Narrative   Not on file   Social Determinants of Health   Financial Resource Strain: Low Risk  (08/21/2022)   Overall Financial Resource Strain (CARDIA)    Difficulty of Paying Living Expenses: Not hard at all  Food Insecurity: No Food Insecurity (08/21/2022)   Hunger Vital Sign    Worried  About Running Out of Food in the Last Year: Never true    Ran Out of Food in the Last Year: Never true  Transportation Needs: No Transportation Needs (08/21/2022)   PRAPARE - Administrator, Civil Service (Medical): No    Lack of Transportation (Non-Medical): No  Physical Activity: Insufficiently Active (08/21/2022)   Exercise Vital Sign    Days of Exercise per Week: 3 days    Minutes of Exercise per Session: 10 min  Stress: No Stress Concern Present (08/21/2022)   [employer] of Occupational Health - Occupational Stress Questionnaire    Feeling of Stress : Only a little  Social Connections: Unknown (08/21/2022)   Social Connection and Isolation Panel [NHANES]    Frequency of Communication with Friends and Family: Once a week    Frequency of Social Gatherings with Friends and Family: Patient declined    Attends Religious Services: More than 4 times per year    Active Member of [employer] or Organizations: No    Attends Banker Meetings: Never    Marital Status: Married  Catering manager Violence: Not At Risk (08/21/2022)   Humiliation, Afraid, Rape, and Kick questionnaire    Fear of Current or Ex-Partner: No    Emotionally Abused: No    Physically Abused: No    Sexually Abused: No    Family History  Problem Relation Age of Onset   Pancreatic cancer Mother    Diabetes Mother    Heart attack Father    Diabetes Brother    Liver disease Brother        drinker   Hypertension Brother    Hypertension  Brother    HIV/AIDS Brother    Breast cancer Maternal Aunt    Rectal cancer Neg Hx    Colon cancer Neg Hx    Esophageal cancer Neg Hx     Past Surgical History:  Procedure Laterality Date   CHOLECYSTECTOMY     CORONARY ANGIOPLASTY WITH STENT PLACEMENT     CORONARY STENT INTERVENTION N/A 08/25/2021   Procedure: CORONARY STENT INTERVENTION;  Surgeon: Corky Crafts, MD;  Location: [facility] INVASIVE CV LAB;  Service: Cardiovascular;  Laterality: N/A;   CORONARY ULTRASOUND/IVUS N/A 08/25/2021   Procedure: Intravascular Ultrasound/IVUS;  Surgeon: Corky Crafts, MD;  Location: [facility] INVASIVE CV LAB;  Service: Cardiovascular;  Laterality: N/A;   LEFT HEART CATH AND CORONARY ANGIOGRAPHY N/A 08/25/2021   Procedure: LEFT HEART CATH AND CORONARY ANGIOGRAPHY;  Surgeon: Corky Crafts, MD;  Location: [facility] INVASIVE CV LAB;  Service: Cardiovascular;  Laterality: N/A;   TONSILLECTOMY      ROS: Review of Systems Negative except as stated above  PHYSICAL EXAM: BP 135/76 (BP Location: Left Arm, Patient Position: Sitting, Cuff Size: Large)   Pulse 69   Temp 98.2 F (36.8 C) (Oral)   Ht 5\' 3"  (1.6 m)   Wt 181 lb (82.1 kg)   SpO2 99%   BMI 32.06 kg/m   Wt Readings from Last 3 Encounters:  11/20/22 181 lb (82.1 kg)  10/05/22 183 lb 12.8 oz (83.4 kg)  07/10/22 184 lb (83.5 kg)    Physical Exam  General appearance - alert, well appearing, older Hispanic female and in no distress Mental status - normal mood, behavior, speech, dress, motor activity, and thought processes Neck - supple, no significant adenopathy Chest - clear to auscultation, no wheezes, rales or rhonchi, symmetric air entry Heart - normal rate, regular rhythm, normal S1, S2, no murmurs, rubs, clicks  or gallops Extremities - peripheral pulses normal, no pedal edema, no clubbing or cyanosis      Latest Ref Rng & Units 08/26/2021    2:29 AM 08/25/2021    5:23 AM 08/24/2021    4:24 AM  CMP  Glucose 70 - 99 mg/dL 161   096  045   BUN 8 - 23 mg/dL 17  15  13    Creatinine 0.44 - 1.00 mg/dL 4.09  8.11  9.14   Sodium 135 - 145 mmol/L 134  135  135   Potassium 3.5 - 5.1 mmol/L 3.7  3.7  3.6   Chloride 98 - 111 mmol/L 100  102  102   CO2 22 - 32 mmol/L 26  25  23    Calcium 8.9 - 10.3 mg/dL 9.2  9.4  9.4    Lipid Panel     Component Value Date/Time   CHOL 130 12/19/2021 1040   TRIG 111 12/19/2021 1040   HDL 43 12/19/2021 1040   CHOLHDL 3.0 12/19/2021 1040   CHOLHDL 5.1 10/06/2016 0333   VLDL 31 10/06/2016 0333   LDLCALC 67 12/19/2021 1040    CBC    Component Value Date/Time   WBC 6.8 08/26/2021 0229   RBC 4.02 08/26/2021 0229   HGB 12.2 08/26/2021 0229   HGB 12.4 09/20/2020 0854   HCT 35.7 (L) 08/26/2021 0229   HCT 38.0 09/20/2020 0854   PLT 307 08/26/2021 0229   PLT 301 09/20/2020 0854   MCV 88.8 08/26/2021 0229   MCV 92 09/20/2020 0854   MCH 30.3 08/26/2021 0229   MCHC 34.2 08/26/2021 0229   RDW 12.8 08/26/2021 0229   RDW 13.3 09/20/2020 0854   LYMPHSABS 2.4 08/23/2021 0343   MONOABS 0.8 08/23/2021 0343   EOSABS 0.3 08/23/2021 0343   BASOSABS 0.0 08/23/2021 0343    ASSESSMENT AND PLAN:  1. Type 2 diabetes mellitus with other circulatory complication, with long-term current use of insulin (HCC) A1c close to goal. Dietary counseling given.  She loves to snack on chips.  Recommend healthier snacks like unsalted nuts or fruits. Continue current dose of metformin, glipizide, Farxiga and Lantus insulin 27 units daily. - POCT glucose (manual entry) - POCT glycosylated hemoglobin (Hb A1C)  2. Hypertension associated with diabetes (HCC) Close to goal. She will continue current medications including amlodipine 10 mg daily, HCTZ 25 mg daily, Cozaar 100 mg daily, metoprolol XL 100 mg in the morning and 50 mg in the evening   3. Hyperlipidemia associated with type 2 diabetes mellitus (HCC) Continue Zetia 10 mg daily and atorvastatin 40 mg daily.  4.  OSA on CPAP Endorses using and  benefiting from CPAP. 5.  Dry cough -I suspect her cough is most likely due to allergies rather than from the CPAP machine.  She endorses cleaning the machine regularly and using fresh distilled water in the device.  Recommend using Claritin and Flonase nasal spray over-the-counter.  6.  Need for breast cancer screening. Order placed for mammogram.  Patient was given the opportunity to ask questions.  Patient verbalized understanding of the plan and was able to repeat key elements of the plan.   This documentation was completed using Paediatric nurse.  Any transcriptional errors are unintentional.  Orders Placed This Encounter  Procedures   POCT glucose (manual entry)   POCT glycosylated hemoglobin (Hb A1C)     Requested Prescriptions   Pending Prescriptions Disp Refills   amLODipine (NORVASC) 10 MG tablet 90 tablet 3  Sig: TAKE 1 TABLET (10 MG TOTAL) BY MOUTH DAILY.   atorvastatin (LIPITOR) 40 MG tablet 90 tablet 3    Sig: Take 1 tablet (40 mg total) by mouth at bedtime.   glipiZIDE (GLUCOTROL) 10 MG tablet 180 tablet 3    Sig: Take 0.5 tablets (5 mg total) by mouth 2 (two) times daily before a meal.   hydrochlorothiazide (HYDRODIURIL) 25 MG tablet 90 tablet 3    Sig: Take 1 tablet (25 mg total) by mouth daily.   insulin glargine (LANTUS) 100 UNIT/ML injection 30 mL 11    Sig: Inject 0.27 mLs (27 Units total) into the skin at bedtime.   metFORMIN (GLUCOPHAGE) 1000 MG tablet 180 tablet 3    Sig: TAKE 1 TABLET (1,000 MG TOTAL) BY MOUTH 2 (TWO) TIMES DAILY.    No follow-ups on file.  Jonah Blue, MD, FACP

## 2022-11-21 ENCOUNTER — Encounter (HOSPITAL_BASED_OUTPATIENT_CLINIC_OR_DEPARTMENT_OTHER): Payer: Self-pay

## 2022-11-21 LAB — MICROALBUMIN / CREATININE URINE RATIO
Creatinine, Urine: 87.8 mg/dL
Microalb/Creat Ratio: 15 mg/g creat (ref 0–29)
Microalbumin, Urine: 13.3 ug/mL

## 2022-11-27 DIAGNOSIS — Z9861 Coronary angioplasty status: Secondary | ICD-10-CM | POA: Diagnosis not present

## 2022-11-27 DIAGNOSIS — I2510 Atherosclerotic heart disease of native coronary artery without angina pectoris: Secondary | ICD-10-CM | POA: Diagnosis not present

## 2022-11-27 DIAGNOSIS — E782 Mixed hyperlipidemia: Secondary | ICD-10-CM | POA: Diagnosis not present

## 2022-11-28 LAB — LIPOPROTEIN A (LPA): Lipoprotein (a): 30.3 nmol/L (ref <75.0)

## 2022-12-13 DIAGNOSIS — G4733 Obstructive sleep apnea (adult) (pediatric): Secondary | ICD-10-CM | POA: Diagnosis not present

## 2023-01-01 ENCOUNTER — Ambulatory Visit
Admission: RE | Admit: 2023-01-01 | Discharge: 2023-01-01 | Disposition: A | Discharge status: Home / Self Care | Payer: Medicare PPO | Source: Ambulatory Visit | Attending: Internal Medicine | Admitting: Internal Medicine

## 2023-01-01 DIAGNOSIS — Z1231 Encounter for screening mammogram for malignant neoplasm of breast: Secondary | ICD-10-CM

## 2023-02-12 ENCOUNTER — Encounter: Payer: Self-pay | Admitting: Internal Medicine

## 2023-02-26 DIAGNOSIS — H2513 Age-related nuclear cataract, bilateral: Secondary | ICD-10-CM | POA: Diagnosis not present

## 2023-02-26 DIAGNOSIS — E119 Type 2 diabetes mellitus without complications: Secondary | ICD-10-CM | POA: Diagnosis not present

## 2023-02-26 LAB — HM DIABETES EYE EXAM

## 2023-02-27 ENCOUNTER — Other Ambulatory Visit (HOSPITAL_BASED_OUTPATIENT_CLINIC_OR_DEPARTMENT_OTHER): Payer: Self-pay | Admitting: Cardiology

## 2023-03-02 ENCOUNTER — Other Ambulatory Visit: Payer: Self-pay | Admitting: Internal Medicine

## 2023-03-02 DIAGNOSIS — E1159 Type 2 diabetes mellitus with other circulatory complications: Secondary | ICD-10-CM

## 2023-03-05 ENCOUNTER — Other Ambulatory Visit: Payer: Self-pay

## 2023-03-19 ENCOUNTER — Encounter: Payer: Self-pay | Admitting: Internal Medicine

## 2023-03-19 ENCOUNTER — Ambulatory Visit: Payer: Medicare PPO | Attending: Internal Medicine | Admitting: Internal Medicine

## 2023-03-19 VITALS — BP 127/72 | HR 78 | Temp 98.1°F | Ht 63.0 in | Wt 184.0 lb

## 2023-03-19 DIAGNOSIS — E669 Obesity, unspecified: Secondary | ICD-10-CM

## 2023-03-19 DIAGNOSIS — I152 Hypertension secondary to endocrine disorders: Secondary | ICD-10-CM | POA: Diagnosis not present

## 2023-03-19 DIAGNOSIS — Z7984 Long term (current) use of oral hypoglycemic drugs: Secondary | ICD-10-CM | POA: Diagnosis not present

## 2023-03-19 DIAGNOSIS — Z794 Long term (current) use of insulin: Secondary | ICD-10-CM | POA: Diagnosis not present

## 2023-03-19 DIAGNOSIS — E785 Hyperlipidemia, unspecified: Secondary | ICD-10-CM

## 2023-03-19 DIAGNOSIS — D509 Iron deficiency anemia, unspecified: Secondary | ICD-10-CM

## 2023-03-19 DIAGNOSIS — E119 Type 2 diabetes mellitus without complications: Secondary | ICD-10-CM | POA: Diagnosis not present

## 2023-03-19 DIAGNOSIS — E1169 Type 2 diabetes mellitus with other specified complication: Secondary | ICD-10-CM

## 2023-03-19 DIAGNOSIS — E1159 Type 2 diabetes mellitus with other circulatory complications: Secondary | ICD-10-CM | POA: Diagnosis not present

## 2023-03-19 DIAGNOSIS — I2510 Atherosclerotic heart disease of native coronary artery without angina pectoris: Secondary | ICD-10-CM

## 2023-03-19 LAB — POCT GLYCOSYLATED HEMOGLOBIN (HGB A1C): HbA1c, POC (controlled diabetic range): 7.6 % — AB (ref 0.0–7.0)

## 2023-03-19 LAB — GLUCOSE, POCT (MANUAL RESULT ENTRY): POC Glucose: 140 mg/dL — AB (ref 70–99)

## 2023-03-19 MED ORDER — GLIPIZIDE 10 MG PO TABS
ORAL_TABLET | ORAL | 3 refills | Status: DC
Start: 2023-03-19 — End: 2024-02-22

## 2023-03-19 NOTE — Progress Notes (Signed)
Patient ID: Breanna Obrien, female    DOB: 22-Sep-1955  MRN: [id number]  CC: Diabetes (DM f/u. /No questions / concerns/Already received flu vax.)   Subjective: Breanna Obrien is a 67 y.o. female who presents for chronic ds management. 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   DM/Obesity: Results for orders placed or performed in visit on 03/19/23  POCT glycosylated hemoglobin (Hb A1C)  Result Value Ref Range   Hemoglobin A1C     HbA1c POC (<> result, manual entry)     HbA1c, POC (prediabetic range)     HbA1c, POC (controlled diabetic range) 7.6 (A) 0.0 - 7.0 %  POCT glucose (manual entry)  Result Value Ref Range   POC Glucose 140 (A) 70 - 99 mg/dl  O5D increased from last visit when it was 7. Reports she is not surprised by the increased A1C - eating late.  She did cut back on eating chips. Walking 20-30 mins a day -checks BS 3-4x/day:  did not bring log.  BS before BF 110-150 depending on what she eats the night before.  After meals BS 170s Reports compliance with meds:  Lantus 27 units daily, Farxiga 10 mg daily metformin 1 gram BID, Glucotrol 5 mg BID Had DM eye exam 02/21/2023 - no DM retinopathy   HYPERTENSION/CAD/HL Currently taking: see medication list. -HCTZ 25 mg daily, metoprolol XL 100 mg in the a.m. and 50 mg in the p.m., Norvasc 10 mg daily, Cozaar 100 mg daily, Lipitor 40 mg daily, Zetia 10 mg daily, Brilinta and aspirin.  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 11/12/2020   Abnormal mammogram 01/15/2017   CAD S/P percutaneous coronary angioplasty 01/15/2017   Hx of Bell's palsy 01/15/2017   Insomnia due to medical condition 10/24/2016   Depression 10/24/2016   Type 2 diabetes mellitus with complication, without long-term current use of insulin (HCC) 10/06/2016   Essential hypertension 10/06/2016   Ataxia    Hyperlipidemia    Bilateral carotid artery stenosis    CVA (cerebral vascular accident) (HCC) 10/05/2016   TIA (transient ischemic attack) 06/30/2016     Current Outpatient Medications on File Prior to Visit  Medication Sig Dispense Refill   ALPHA LIPOIC ACID PO Take 1 tablet by mouth daily. With green tea     amLODipine (NORVASC) 10 MG tablet TAKE 1 TABLET (10 MG TOTAL) BY MOUTH DAILY. 90 tablet 3   Ascorbic Acid (VITAMIN C PO) Take 1 tablet by mouth daily.     aspirin 81 MG chewable tablet Chew 1 tablet (81 mg total) by mouth daily.     atorvastatin (LIPITOR) 40 MG tablet Take 1 tablet (40 mg total) by mouth at bedtime. 90 tablet 3   calcium-vitamin D (OSCAL WITH D) 500-200 MG-UNIT tablet Take 1 tablet by mouth.     CHROMIUM PO Take 1 tablet by mouth daily.     dapagliflozin propanediol (FARXIGA) 10 MG TABS tablet Take 1 tablet (10 mg total)  by mouth daily. 90 tablet 3   ezetimibe (ZETIA) 10 MG tablet TAKE 1 TABLET EVERY DAY 90 tablet 1   Fish Oil-Cholecalciferol (FISH OIL + D3 PO) Take 1 tablet by mouth daily.     hydrochlorothiazide (HYDRODIURIL) 25 MG tablet Take 1 tablet (25 mg total) by mouth daily. 90 tablet 3   insulin glargine (LANTUS) 100 UNIT/ML injection Inject 0.27 mLs (27 Units total) into the skin at bedtime. 30 mL 11   Insulin Syringe-Needle U-100 (TRUEPLUS INSULIN SYRINGE) 30G X 5/16" 0.5 ML MISC UAD 100 each 11   losartan (COZAAR) 100 MG tablet TAKE 1 TABLET EVERY DAY 90 tablet 3   MAGNESIUM PO Take 1 tablet by mouth daily.     metFORMIN (GLUCOPHAGE) 1000 MG tablet TAKE 1  TABLET (1,000 MG TOTAL) BY MOUTH 2 (TWO) TIMES DAILY. 180 tablet 3   metoprolol succinate (TOPROL-XL) 100 MG 24 hr tablet Take 1 tablet (100 mg total) by mouth daily. Take with or immediately following a meal. 90 tablet 1   metoprolol succinate (TOPROL-XL) 50 MG 24 hr tablet Take 1 tablet (50 mg total) by mouth daily. Take with or immediately following a meal, take in the evening. 90 tablet 1   Multiple Vitamin (MULTIVITAMIN PO) Take 1 tablet by mouth daily.     nitroGLYCERIN (NITROSTAT) 0.4 MG SL tablet Place 1 tablet (0.4 mg total) under the tongue every 5 (five) minutes x 3 doses as needed for chest pain. 25 tablet 1   ticagrelor (BRILINTA) 90 MG TABS tablet Take 1 tablet (90 mg total) by mouth 2 (two) times daily. 180 tablet 3   No current facility-administered medications on file prior to visit.    Allergies  Allergen Reactions   Trazodone And Nefazodone Other (See Comments)    Makes her feel jittery    Social History   Socioeconomic History   Marital status: Married    Spouse name: Not on file   Number of children: 1   Years of education: Not on file   Highest education level: GED or equivalent  Occupational History   Occupation: retired  Tobacco Use   Smoking status: Former    Current packs/day: 0.00    Average packs/day: 1 pack/day for 27.0 years (27.0 ttl pk-yrs)    Types: Cigarettes    Start date: 05/30/1967    Quit date: 05/29/1994    Years since quitting: 28.8   Smokeless tobacco: Never  Vaping Use   Vaping status: Never Used  Substance and Sexual Activity   Alcohol use: No   Drug use: Never   Sexual activity: Yes    Birth control/protection: Post-menopausal, None  Other Topics Concern   Not on file  Social History Narrative   Not on file   Social Determinants of Health   Financial Resource Strain: Medium Risk (03/15/2023)   Overall Financial Resource Strain (CARDIA)    Difficulty of Paying Living Expenses: Somewhat hard  Food Insecurity: No Food Insecurity  (03/15/2023)   Hunger Vital Sign    Worried About Running Out of Food in the Last Year: Never true    Ran Out of Food in the Last Year: Never true  Transportation Needs: No Transportation Needs (03/15/2023)   PRAPARE - Administrator, Civil Service (Medical): No    Lack of Transportation (Non-Medical): No  Physical Activity: Sufficiently Active (03/15/2023)   Exercise Vital Sign    Days of Exercise per Week: 5 days    Minutes of Exercise per  Session: 30 min  Stress: No Stress Concern Present (03/15/2023)   [employer] of Occupational Health - Occupational Stress Questionnaire    Feeling of Stress : Only a little  Social Connections: Moderately Isolated (03/15/2023)   Social Connection and Isolation Panel [NHANES]    Frequency of Communication with Friends and Family: Never    Frequency of Social Gatherings with Friends and Family: Never    Attends Religious Services: More than 4 times per year    Active Member of [employer] or Organizations: No    Attends Banker Meetings: Never    Marital Status: Married  Catering manager Violence: Not At Risk (08/21/2022)   Humiliation, Afraid, Rape, and Kick questionnaire    Fear of Current or Ex-Partner: No    Emotionally Abused: No    Physically Abused: No    Sexually Abused: No    Family History  Problem Relation Age of Onset   Pancreatic cancer Mother    Diabetes Mother    Heart attack Father    Diabetes Brother    Liver disease Brother        drinker   Hypertension Brother    Hypertension Brother    HIV/AIDS Brother    Breast cancer Maternal Aunt    Rectal cancer Neg Hx    Colon cancer Neg Hx    Esophageal cancer Neg Hx     Past Surgical History:  Procedure Laterality Date   CHOLECYSTECTOMY     CORONARY ANGIOPLASTY WITH STENT PLACEMENT     CORONARY STENT INTERVENTION N/A 08/25/2021   Procedure: CORONARY STENT INTERVENTION;  Surgeon: Corky Crafts, MD;  Location: [facility] INVASIVE CV LAB;  Service:  Cardiovascular;  Laterality: N/A;   CORONARY ULTRASOUND/IVUS N/A 08/25/2021   Procedure: Intravascular Ultrasound/IVUS;  Surgeon: Corky Crafts, MD;  [facility] CV LAB;  Service: Cardiovascular;  Laterality: N/A;   LEFT HEART CATH AND CORONARY ANGIOGRAPHY N/A 08/25/2021   Procedure: LEFT HEART CATH AND CORONARY ANGIOGRAPHY;  Surgeon: Corky Crafts, MD;  Location: [facility] INVASIVE CV LAB;  Service: Cardiovascular;  Laterality: N/A;   TONSILLECTOMY      ROS: Review of Systems Negative except as stated above  PHYSICAL EXAM: BP 127/72 (BP Location: Left Arm, Patient Position: Sitting, Cuff Size: Normal)   Pulse 78   Temp 98.1 F (36.7 C) (Oral)   Ht 5\' 3"  (1.6 m)   Wt 184 lb (83.5 kg)   SpO2 99%   BMI 32.59 kg/m   Wt Readings from Last 3 Encounters:  03/19/23 184 lb (83.5 kg)  11/20/22 181 lb (82.1 kg)  10/05/22 183 lb 12.8 oz (83.4 kg)    Physical Exam  General appearance - alert, well appearing, older female and in no distress Mental status - normal mood, behavior, speech, dress, motor activity, and thought processes Neck - supple, no significant adenopathy Chest - clear to auscultation, no wheezes, rales or rhonchi, symmetric air entry Heart - normal rate, regular rhythm, normal S1, S2, no murmurs, rubs, clicks or gallops Extremities - peripheral pulses normal, no pedal edema, no clubbing or cyanosis Diabetic Foot Exam - Simple   Simple Foot Form Diabetic Foot exam was performed with the following findings: Yes 03/19/2023 10:13 AM  Visual Inspection See comments: Yes Sensation Testing Intact to touch and monofilament testing bilaterally: Yes Pulse Check Posterior Tibialis and Dorsalis pulse intact bilaterally: Yes Comments Skin on the dorsal and plantar surface of both feet dry.  Small callus on the medial aspect of  the right foot.         Latest Ref Rng & Units 08/26/2021    2:29 AM 08/25/2021    5:23 AM 08/24/2021    4:24 AM  CMP  Glucose 70  - 99 mg/dL 098  119  147   BUN 8 - 23 mg/dL 17  15  13    Creatinine 0.44 - 1.00 mg/dL 8.29  5.62  1.30   Sodium 135 - 145 mmol/L 134  135  135   Potassium 3.5 - 5.1 mmol/L 3.7  3.7  3.6   Chloride 98 - 111 mmol/L 100  102  102   CO2 22 - 32 mmol/L 26  25  23    Calcium 8.9 - 10.3 mg/dL 9.2  9.4  9.4    Lipid Panel     Component Value Date/Time   CHOL 130 12/19/2021 1040   TRIG 111 12/19/2021 1040   HDL 43 12/19/2021 1040   CHOLHDL 3.0 12/19/2021 1040   CHOLHDL 5.1 10/06/2016 0333   VLDL 31 10/06/2016 0333   LDLCALC 67 12/19/2021 1040    CBC    Component Value Date/Time   WBC 6.8 08/26/2021 0229   RBC 4.02 08/26/2021 0229   HGB 12.2 08/26/2021 0229   HGB 12.4 09/20/2020 0854   HCT 35.7 (L) 08/26/2021 0229   HCT 38.0 09/20/2020 0854   PLT 307 08/26/2021 0229   PLT 301 09/20/2020 0854   MCV 88.8 08/26/2021 0229   MCV 92 09/20/2020 0854   MCH 30.3 08/26/2021 0229   MCHC 34.2 08/26/2021 0229   RDW 12.8 08/26/2021 0229   RDW 13.3 09/20/2020 0854   LYMPHSABS 2.4 08/23/2021 0343   MONOABS 0.8 08/23/2021 0343   EOSABS 0.3 08/23/2021 0343   BASOSABS 0.0 08/23/2021 0343    ASSESSMENT AND PLAN: 1. Type 2 diabetes mellitus with obesity (HCC) A1c has increased and not at goal. Patient plans to stop eating dinner late.  She we will continue to walk regularly for exercise. Increase glipizide to 10 mg in the morning and 5 mg in the evening.  Continue Farxiga 10 mg daily and Lantus insulin 27 units daily. -Request sent to her optometrist to get results of her recent eye exam. - POCT glycosylated hemoglobin (Hb A1C) - POCT glucose (manual entry) - CBC - Comprehensive metabolic panel - glipiZIDE (GLUCOTROL) 10 MG tablet; 10 mg PO Q a.m and 5 mg Q p.m  Dispense: 135 tablet; Refill: 3  2. Insulin long-term use (HCC) See #1 above  3. Diabetes mellitus treated with oral medication (HCC) See #1 above  4. Hypertension associated with diabetes (HCC) At goal.  Continue metoprolol  100 mg in the morning, 50 mg in the evening, Cozaar 100 mg daily, HCTZ 25 mg daily and amlodipine 10 mg daily.  5. Hyperlipidemia associated with type 2 diabetes mellitus (HCC) Due for lipid profile.  Continue atorvastatin 40 mg daily and Zetia 10 mg daily.  6. Coronary artery disease involving native coronary artery of native heart without angina pectoris Continue aspirin, Brilinta, Zetia, atorvastatin, metoprolol 100 mg in the morning and 50 mg in the evening - Lipid panel  Addendum: Patient with new microcytic anemia on CBC.  We will add iron studies.  Patient was given the opportunity to ask questions.  Patient verbalized understanding of the plan and was able to repeat key elements of the plan.   This documentation was completed using Paediatric nurse.  Any transcriptional errors are unintentional.  Orders Placed This  Encounter  Procedures   CBC   Comprehensive metabolic panel   Lipid panel   POCT glycosylated hemoglobin (Hb A1C)   POCT glucose (manual entry)     Requested Prescriptions   Signed Prescriptions Disp Refills   glipiZIDE (GLUCOTROL) 10 MG tablet 135 tablet 3    Sig: 10 mg PO Q a.m and 5 mg Q p.m    Return in about 4 months (around 07/20/2023).  Jonah Blue, MD, FACP

## 2023-03-19 NOTE — Patient Instructions (Signed)
Your A1c is not at goal. Try eating your last meal earlier in the evening. Increase glipizide to 10 mg in the morning and 5 mg in the evening.

## 2023-03-20 ENCOUNTER — Encounter: Payer: Self-pay | Admitting: Internal Medicine

## 2023-03-20 LAB — CBC
Hematocrit: 33.1 % — ABNORMAL LOW (ref 34.0–46.6)
Hemoglobin: 9.7 g/dL — ABNORMAL LOW (ref 11.1–15.9)
MCH: 23 pg — ABNORMAL LOW (ref 26.6–33.0)
MCHC: 29.3 g/dL — ABNORMAL LOW (ref 31.5–35.7)
MCV: 79 fL (ref 79–97)
Platelets: 437 10*3/uL (ref 150–450)
RBC: 4.21 x10E6/uL (ref 3.77–5.28)
RDW: 18.6 % — ABNORMAL HIGH (ref 11.7–15.4)
WBC: 8.5 10*3/uL (ref 3.4–10.8)

## 2023-03-20 LAB — LIPID PANEL
Chol/HDL Ratio: 2.6 ratio (ref 0.0–4.4)
Cholesterol, Total: 88 mg/dL — ABNORMAL LOW (ref 100–199)
HDL: 34 mg/dL — ABNORMAL LOW (ref >39)
LDL Chol Calc (NIH): 33 mg/dL (ref 0–99)
Triglycerides: 115 mg/dL (ref 0–149)
VLDL Cholesterol Cal: 21 mg/dL (ref 5–40)

## 2023-03-20 LAB — COMPREHENSIVE METABOLIC PANEL
ALT: 20 [IU]/L (ref 0–32)
AST: 24 [IU]/L (ref 0–40)
Albumin: 4.5 g/dL (ref 3.9–4.9)
Alkaline Phosphatase: 110 [IU]/L (ref 44–121)
BUN/Creatinine Ratio: 16 (ref 12–28)
BUN: 17 mg/dL (ref 8–27)
Bilirubin Total: 0.4 mg/dL (ref 0.0–1.2)
CO2: 22 mmol/L (ref 20–29)
Calcium: 9.7 mg/dL (ref 8.7–10.3)
Chloride: 97 mmol/L (ref 96–106)
Creatinine, Ser: 1.07 mg/dL — ABNORMAL HIGH (ref 0.57–1.00)
Globulin, Total: 3.2 g/dL (ref 1.5–4.5)
Glucose: 134 mg/dL — ABNORMAL HIGH (ref 70–99)
Potassium: 4.6 mmol/L (ref 3.5–5.2)
Sodium: 135 mmol/L (ref 134–144)
Total Protein: 7.7 g/dL (ref 6.0–8.5)
eGFR: 57 mL/min/{1.73_m2} — ABNORMAL LOW (ref >59)

## 2023-03-20 NOTE — Addendum Note (Signed)
Addended by: Jonah Blue B on: 03/20/2023 01:46 PM   Modules accepted: Orders

## 2023-03-21 ENCOUNTER — Telehealth (INDEPENDENT_AMBULATORY_CARE_PROVIDER_SITE_OTHER): Payer: Self-pay | Admitting: Internal Medicine

## 2023-03-21 ENCOUNTER — Encounter: Payer: Self-pay | Admitting: Internal Medicine

## 2023-03-21 NOTE — Telephone Encounter (Signed)
Copied from CRM [id number]. Topic: General - Inquiry >> Mar 21, 2023 10:46 AM De Blanch wrote: Reason for CRM: Lurena Joiner from [facility] is calling to f/u on a fax sent to the office on 10/18. Stated it is a standard written order to receive [employer]. Lurena Joiner stated she will fax again today, and when it is filled out, please return with the most recent chart notes. Please advise

## 2023-03-22 ENCOUNTER — Encounter: Payer: Self-pay | Admitting: Internal Medicine

## 2023-03-22 ENCOUNTER — Other Ambulatory Visit: Payer: Self-pay | Admitting: Internal Medicine

## 2023-03-22 ENCOUNTER — Telehealth: Payer: Self-pay | Admitting: Internal Medicine

## 2023-03-22 DIAGNOSIS — D509 Iron deficiency anemia, unspecified: Secondary | ICD-10-CM

## 2023-03-22 LAB — IRON,TIBC AND FERRITIN PANEL
Ferritin: 13 ng/mL — ABNORMAL LOW (ref 15–150)
Iron Saturation: 5 % — CL (ref 15–55)
Iron: 22 ug/dL — ABNORMAL LOW (ref 27–139)
Total Iron Binding Capacity: 435 ug/dL (ref 250–450)
UIBC: 413 ug/dL — ABNORMAL HIGH (ref 118–369)

## 2023-03-22 LAB — SPECIMEN STATUS REPORT

## 2023-03-22 NOTE — Telephone Encounter (Signed)
Phone call placed to patient this morning to go over lab results.  I got her voicemail.  I did not leave a message.  We will send her a MyChart message with her lab results.

## 2023-03-23 ENCOUNTER — Other Ambulatory Visit: Payer: Self-pay

## 2023-03-23 ENCOUNTER — Other Ambulatory Visit: Payer: Self-pay | Admitting: Pharmacist

## 2023-03-23 DIAGNOSIS — Z794 Long term (current) use of insulin: Secondary | ICD-10-CM

## 2023-03-23 MED ORDER — DAPAGLIFLOZIN PROPANEDIOL 10 MG PO TABS
10.0000 mg | ORAL_TABLET | Freq: Every day | ORAL | 3 refills | Status: DC
Start: 2023-03-23 — End: 2023-09-10

## 2023-03-23 NOTE — Telephone Encounter (Signed)
Form successfully faxed back to [facility] on 03/23/2023.

## 2023-03-29 ENCOUNTER — Other Ambulatory Visit: Payer: Self-pay

## 2023-03-30 ENCOUNTER — Other Ambulatory Visit: Payer: Self-pay

## 2023-04-06 ENCOUNTER — Telehealth: Payer: Self-pay | Admitting: Internal Medicine

## 2023-04-06 NOTE — Telephone Encounter (Signed)
Copied from CRM [id number]. Topic: General - Other >> Apr 06, 2023 11:19 AM Macon Large wrote: Reason for CRM: Lurena Joiner with [facility] called for an update on the request for most recent chart notes for the [facility]. Lurena Joiner stated that the request was sent on 03/23/23 but they have not received any notes back. Cb# [phone]  fax# [phone]

## 2023-04-06 NOTE — Telephone Encounter (Signed)
Faxed to [phone]

## 2023-04-11 ENCOUNTER — Other Ambulatory Visit: Payer: Self-pay | Admitting: Pharmacist

## 2023-04-11 NOTE — Progress Notes (Signed)
Pharmacy Quality Measure Review  This patient is appearing on a report for being at risk of failing the adherence measure for cholesterol (statin) and hypertension (ACEi/ARB) medications this calendar year.   Medication: atorvastatin Last fill date: 11/21/22 for 90 day supply  Contacted pharmacy to facilitate refills.  Medication: losartan Last fill date: 03/02/2023 for 90 day supply  Insurance report was not up to date. No action needed at this time.   Butch Penny, PharmD, P[place]y [place], CPP Clinical Pharmacist [facility] [phone]

## 2023-04-12 ENCOUNTER — Other Ambulatory Visit: Payer: Self-pay

## 2023-04-12 ENCOUNTER — Ambulatory Visit (HOSPITAL_BASED_OUTPATIENT_CLINIC_OR_DEPARTMENT_OTHER): Payer: Medicare PPO | Admitting: Cardiology

## 2023-04-12 ENCOUNTER — Encounter (HOSPITAL_BASED_OUTPATIENT_CLINIC_OR_DEPARTMENT_OTHER): Payer: Self-pay | Admitting: Cardiology

## 2023-04-12 VITALS — BP 128/68 | HR 77 | Ht 63.0 in | Wt 190.1 lb

## 2023-04-12 DIAGNOSIS — Z8673 Personal history of transient ischemic attack (TIA), and cerebral infarction without residual deficits: Secondary | ICD-10-CM | POA: Diagnosis not present

## 2023-04-12 DIAGNOSIS — Z9861 Coronary angioplasty status: Secondary | ICD-10-CM

## 2023-04-12 DIAGNOSIS — I10 Essential (primary) hypertension: Secondary | ICD-10-CM | POA: Diagnosis not present

## 2023-04-12 DIAGNOSIS — Z7189 Other specified counseling: Secondary | ICD-10-CM

## 2023-04-12 DIAGNOSIS — G4733 Obstructive sleep apnea (adult) (pediatric): Secondary | ICD-10-CM

## 2023-04-12 DIAGNOSIS — I479 Paroxysmal tachycardia, unspecified: Secondary | ICD-10-CM | POA: Diagnosis not present

## 2023-04-12 DIAGNOSIS — E782 Mixed hyperlipidemia: Secondary | ICD-10-CM

## 2023-04-12 DIAGNOSIS — E1159 Type 2 diabetes mellitus with other circulatory complications: Secondary | ICD-10-CM | POA: Diagnosis not present

## 2023-04-12 DIAGNOSIS — I252 Old myocardial infarction: Secondary | ICD-10-CM | POA: Diagnosis not present

## 2023-04-12 DIAGNOSIS — Z794 Long term (current) use of insulin: Secondary | ICD-10-CM

## 2023-04-12 DIAGNOSIS — I2510 Atherosclerotic heart disease of native coronary artery without angina pectoris: Secondary | ICD-10-CM | POA: Diagnosis not present

## 2023-04-12 NOTE — Progress Notes (Signed)
Cardiology Office Note:  .   Date:  04/12/2023  ID:  Breanna Obrien, DOB 1955/12/19, MRN [id number] PCP: Marcine Matar, MD  [facility] Providers Cardiologist:  Jodelle Red, MD {  History of Present Illness: .   Breanna Obrien is a 67 y.o. female with a hx of CAD s/p DES to LAD x 2 ( 2008 in [place]) and NSTEMI s/p mid RCA DES (2023), CVA (09/2016), Bell's palsy, bilateral carotid artery stenosis, hypertension, diabetes type II, who is here for follow up today. I met her 12/19/21.   CV history: She had presented to the ED 08/23/21 and was found to have NSTEMI. Left heart cath revealed 90% stenosed mid RCA that was treated with DES x1. She had monitor for palpitations in 2023 which showed intermittent sinus tachycardia up to 145 bpm but otherwise no significant arrhythmias and infrequent ectopy (1.8% PACs).   Has history of CAD s/p DES to LAD x 2 ( 2008 in [place]) and NSTEMI s/p mid RCA (07/2021), CVA (09/2016).  Has not tolerated mask for CPAP.   Today: Notices in the evenings, her heart rate gradually increases throughout the day. Can be 100 bpm while she is resting in bed. Can be associated with elevated blood pressures as well. Can be as low as 68 bpm the morning.  ROS: Denies chest pain, shortness of breath at rest or with normal exertion. No PND, orthopnea, LE edema or unexpected weight gain. No syncope or palpitations. ROS otherwise negative except as noted.   Studies Reviewed: Marland Kitchen    EKG:       Physical Exam:   VS:  BP 128/68   Pulse 77   Ht 5\' 3"  (1.6 m)   Wt 190 lb 1.6 oz (86.2 kg)   SpO2 97%   BMI 33.67 kg/m    Wt Readings from Last 3 Encounters:  04/12/23 190 lb 1.6 oz (86.2 kg)  03/19/23 184 lb (83.5 kg)  11/20/22 181 lb (82.1 kg)    GEN: Well nourished, well developed in no acute distress HEENT: Normal, moist mucous membranes NECK: No JVD CARDIAC: regular rhythm, normal S1 and S2, no rubs or gallops. No murmur. VASCULAR: Radial and DP pulses 2+  bilaterally. No carotid bruits RESPIRATORY:  Clear to auscultation without rales, wheezing or rhonchi  ABDOMEN: Soft, non-tender, non-distended MUSCULOSKELETAL:  Ambulates independently SKIN: Warm and dry, no edema NEUROLOGIC:  Alert and oriented x 3. Bells palsy L side PSYCHIATRIC:  Normal affect    ASSESSMENT AND PLAN: .    CAD s/p PCI History of NSTEMI History of CVA Mixed hyperlipidemia -continue aspirin and ticagrelor. Has stent in stent -continue atorvastatin, ezetimibe. Would aim for aggressive control with LDL <55. Most recent LDL 33. -continue metoprolol -lpa normal at 30.3   Hypertension -initially elevated today, improved on recheck -checks BP at home, runs 120s/70 at home. -continue amlodipine, HCTZ, metoprolol, losartan   Type II diabetes -on metformin, insulin, glipizide -on dapagliflozin  Intermittent tachycardia -notices mostly at night, sitting up watching TV or before she falls asleep -she will avoid PM caffeine and see if this helps.  -if persists or worsens, repeat monitor -continue metoprolol  OSA: -doesn't tolerate CPAP mask well, tries to use as much as she can tolerate  CV risk counseling and prevention -recommend heart healthy/Mediterranean diet, with whole grains, fruits, vegetable, fish, lean meats, nuts, and olive oil. Limit salt. -recommend moderate walking, 3-5 times/week for 30-50 minutes each session. Aim for at least 150 minutes.week. Goal should  be pace of 3 miles/hours, or walking 1.5 miles in 30 minutes -recommend avoidance of tobacco products. Avoid excess alcohol.  Dispo: 6 mos  Signed, Jodelle Red, MD   Jodelle Red, MD, PhD, [facility]  [facility]  [place]  Heart & Vascular at [facility] [address], [address] [place], [place] [address] [phone]

## 2023-04-12 NOTE — Patient Instructions (Signed)
Medication Instructions:  Your physician recommends that you continue on your current medications as directed. Please refer to the Current Medication list given to you today.  *If you need a refill on your cardiac medications before your next appointment, please call your pharmacy*  Follow-Up: At [facility], you and your health needs are our priority.  As part of our continuing mission to provide you with exceptional heart care, we have created designated Provider Care Teams.  These Care Teams include your primary Cardiologist (physician) and Advanced Practice Providers (APPs -  Physician Assistants and Nurse Practitioners) who all work together to provide you with the care you need, when you need it.  We recommend signing up for the patient portal called "MyChart".  Sign up information is provided on this After Visit Summary.  MyChart is used to connect with patients for Virtual Visits (Telemedicine).  Patients are able to view lab/test results, encounter notes, upcoming appointments, etc.  Non-urgent messages can be sent to your provider as well.   To learn more about what you can do with MyChart, go to [email or web address].    Your next appointment:   6 month(s)  Provider:   Bridgette Christopher, MD    

## 2023-04-18 DIAGNOSIS — E1169 Type 2 diabetes mellitus with other specified complication: Secondary | ICD-10-CM | POA: Diagnosis not present

## 2023-05-16 ENCOUNTER — Encounter: Payer: Self-pay | Admitting: Internal Medicine

## 2023-05-16 NOTE — Telephone Encounter (Signed)
 Care team updated and letter sent for eye exam notes.

## 2023-05-18 DIAGNOSIS — E1169 Type 2 diabetes mellitus with other specified complication: Secondary | ICD-10-CM | POA: Diagnosis not present

## 2023-05-19 ENCOUNTER — Encounter: Payer: Self-pay | Admitting: Internal Medicine

## 2023-05-19 DIAGNOSIS — E113293 Type 2 diabetes mellitus with mild nonproliferative diabetic retinopathy without macular edema, bilateral: Secondary | ICD-10-CM | POA: Insufficient documentation

## 2023-05-24 ENCOUNTER — Telehealth (HOSPITAL_BASED_OUTPATIENT_CLINIC_OR_DEPARTMENT_OTHER): Payer: Self-pay | Admitting: *Deleted

## 2023-05-24 MED ORDER — TICAGRELOR 90 MG PO TABS: 90.0000 mg | ORAL_TABLET | Freq: Two times a day (BID) | ORAL | 3 refills | Status: DC

## 2023-05-24 NOTE — Telephone Encounter (Signed)
Received request from [facility] for refill new Rx for Brilinta Printed and faxed, confirmation received

## 2023-05-31 ENCOUNTER — Other Ambulatory Visit: Payer: Self-pay | Admitting: Pharmacist

## 2023-05-31 ENCOUNTER — Other Ambulatory Visit: Payer: Self-pay

## 2023-05-31 MED ORDER — TICAGRELOR 90 MG PO TABS: 90.0000 mg | ORAL_TABLET | Freq: Two times a day (BID) | ORAL | 3 refills | Status: AC

## 2023-06-13 ENCOUNTER — Telehealth (HOSPITAL_BASED_OUTPATIENT_CLINIC_OR_DEPARTMENT_OTHER): Payer: Self-pay | Admitting: Cardiology

## 2023-06-13 MED ORDER — METOPROLOL SUCCINATE ER 100 MG PO TB24: 100.0000 mg | ORAL_TABLET | Freq: Every day | ORAL | 1 refills | Status: DC

## 2023-06-13 MED ORDER — METOPROLOL SUCCINATE ER 50 MG PO TB24: 50.0000 mg | ORAL_TABLET | Freq: Every day | ORAL | 1 refills | Status: DC

## 2023-06-13 NOTE — Telephone Encounter (Signed)
*  STAT* If patient is at the pharmacy, call can be transferred to refill team.   1. Which medications need to be refilled? (please list name of each medication and dose if known)   metoprolol  succinate (TOPROL -XL) 100 MG 24 hr tablet  metoprolol  succinate (TOPROL -XL) 50 MG 24 hr tablet   2. Would you like to learn more about the convenience, safety, & potential cost savings by using the [facility]?   3. Are you open to using the [facility] (Type [facility]. ).   4. Which pharmacy/location (including street and city if local pharmacy) is medication to be sent to?  [facility] - [place], [place] - [address]   5. Do they need a 30 day or 90 day supply?   90 day  Patient stated she is out of these medications.

## 2023-06-13 NOTE — Telephone Encounter (Signed)
 Medications Metoprolol  50 mg & 100 mg every day sent to pts pharmacy.

## 2023-06-17 DIAGNOSIS — E1169 Type 2 diabetes mellitus with other specified complication: Secondary | ICD-10-CM | POA: Diagnosis not present

## 2023-07-17 DIAGNOSIS — E1169 Type 2 diabetes mellitus with other specified complication: Secondary | ICD-10-CM | POA: Diagnosis not present

## 2023-07-20 DIAGNOSIS — I10 Essential (primary) hypertension: Secondary | ICD-10-CM | POA: Diagnosis not present

## 2023-07-20 DIAGNOSIS — Z794 Long term (current) use of insulin: Secondary | ICD-10-CM | POA: Diagnosis not present

## 2023-07-23 ENCOUNTER — Encounter: Payer: Self-pay | Admitting: Internal Medicine

## 2023-07-23 ENCOUNTER — Ambulatory Visit: Payer: Medicare PPO | Attending: Internal Medicine | Admitting: Internal Medicine

## 2023-07-23 VITALS — BP 126/75 | HR 73 | Temp 97.8°F | Ht 63.0 in | Wt 179.0 lb

## 2023-07-23 DIAGNOSIS — D509 Iron deficiency anemia, unspecified: Secondary | ICD-10-CM

## 2023-07-23 DIAGNOSIS — Z7984 Long term (current) use of oral hypoglycemic drugs: Secondary | ICD-10-CM

## 2023-07-23 DIAGNOSIS — I479 Paroxysmal tachycardia, unspecified: Secondary | ICD-10-CM | POA: Diagnosis not present

## 2023-07-23 DIAGNOSIS — E669 Obesity, unspecified: Secondary | ICD-10-CM | POA: Diagnosis not present

## 2023-07-23 DIAGNOSIS — E1169 Type 2 diabetes mellitus with other specified complication: Secondary | ICD-10-CM

## 2023-07-23 DIAGNOSIS — Z794 Long term (current) use of insulin: Secondary | ICD-10-CM

## 2023-07-23 DIAGNOSIS — J302 Other seasonal allergic rhinitis: Secondary | ICD-10-CM | POA: Diagnosis not present

## 2023-07-23 DIAGNOSIS — I10 Essential (primary) hypertension: Secondary | ICD-10-CM

## 2023-07-23 DIAGNOSIS — I2510 Atherosclerotic heart disease of native coronary artery without angina pectoris: Secondary | ICD-10-CM

## 2023-07-23 DIAGNOSIS — E119 Type 2 diabetes mellitus without complications: Secondary | ICD-10-CM

## 2023-07-23 DIAGNOSIS — I152 Hypertension secondary to endocrine disorders: Secondary | ICD-10-CM

## 2023-07-23 LAB — POCT GLYCOSYLATED HEMOGLOBIN (HGB A1C): HbA1c, POC (controlled diabetic range): 7 % (ref 0.0–7.0)

## 2023-07-23 LAB — GLUCOSE, POCT (MANUAL RESULT ENTRY): POC Glucose: 120 mg/dL — AB (ref 70–99)

## 2023-07-23 NOTE — Progress Notes (Signed)
 Patient ID: Breanna Obrien, female    DOB: 1955-08-09  MRN: [id number]  CC: Diabetes (DM f/u. /No questions / concerns/Already received flu vax)   Subjective: Breanna Obrien is a 68 y.o. female who presents for chronic ds management. 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   Discussed the use of AI scribe software for clinical note transcription with the patient, who gave verbal consent to proceed.  History of Present Illness   The patient, with a history of diabetes, heart disease, and hypertension, presents for a follow-up visit.   DM: Results for orders placed or performed in visit on 07/23/23  POCT glucose (manual entry)   Collection Time: 07/23/23 11:26 AM  Result Value Ref Range   POC Glucose 120 (A) 70 - 99 mg/dl  POCT glycosylated hemoglobin (Hb A1C)   Collection Time: 07/23/23 11:52 AM  Result Value Ref Range   Hemoglobin A1C     HbA1c POC (<> result, manual entry)     HbA1c, POC (prediabetic range)     HbA1c, POC (controlled diabetic range) 7.0 0.0 - 7.0 %  A1c has improved from last visit.  She reports improved diabetes management with the use of a continuous glucose monitor, which has made it easier to monitor her blood sugar levels. She notes that the monitor often alerts her to low blood sugar levels during the night, but she does not experience symptoms typically associated with hypoglycemia.  She lays there for a while and rechecks and finds that the blood sugar goes back up without her not eating anything. -Reports compliance with taking metformin 1 g twice a day, glipizide 10 mg in the morning and 5 mg in the evening and glargine insulin 27 units daily. She walks 4 to 5 days a week about 1 mile.  HTN/CAD: No chest pains or shortness of breath or palpitations.  When  she saw her cardiologist back in November, she had reported some palpitations at nighttime.  She was told to avoid caffeinated beverages and to continue metoprolol. Reports compliance with taking HCTZ 25 mg daily, metoprolol XL 100 mg in the a.m. and 50 mg in the p.m., Norvasc 10 mg daily, Cozaar 100 mg daily, Lipitor 40 mg daily, Zetia 10 mg daily, Brilinta and aspirin.  Has upcoming appointment with her cardiologist Dr. Cristal Deer in several weeks.  Diagnosed with iron deficiency anemia on last visit 4 months ago.  Hemoglobin went from 12.2 to 9.7 with iron studies consistent with iron deficiency.  Advised to start taking iron supplement which she takes daily.  I had recommended referral to GI but patient wanted to hold off on that.  She denies any blood in the stools or change in bowel habits.  No abdominal pain.  In addition to these primary concerns, the patient also mentions struggling with allergies, experiencing significant nasal drainage and sneezing. She has been managing these symptoms with Claritin, Benadryl, and occasional use of Flonase.        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 11/12/2020   Abnormal mammogram 01/15/2017   CAD S/P percutaneous coronary angioplasty 01/15/2017   Hx of Bell's palsy 01/15/2017   Insomnia due to medical condition 10/24/2016   Depression 10/24/2016   Type 2 diabetes mellitus with complication, without long-term current use of insulin (HCC) 10/06/2016   Essential hypertension 10/06/2016   Ataxia    Hyperlipidemia     Bilateral carotid artery stenosis    CVA (cerebral vascular accident) (HCC) 10/05/2016   TIA (transient ischemic attack) 06/30/2016     Current Outpatient Medications on File Prior to Visit  Medication Sig Dispense Refill   ALPHA LIPOIC ACID PO Take 1 tablet by mouth daily. With green tea     amLODipine (NORVASC) 10 MG tablet TAKE 1 TABLET (10 MG TOTAL) BY MOUTH DAILY. 90 tablet 3   Ascorbic Acid (VITAMIN C PO) Take 1 tablet by mouth daily.     aspirin 81 MG chewable tablet Chew 1 tablet (81 mg total) by mouth daily.     atorvastatin (LIPITOR) 40 MG tablet Take 1 tablet (40 mg total) by mouth at bedtime. 90 tablet 3   calcium-vitamin D (OSCAL WITH D) 500-200 MG-UNIT tablet Take 1 tablet by mouth.     CHROMIUM PO Take 1 tablet by mouth daily.     dapagliflozin propanediol (FARXIGA) 10 MG TABS tablet Take 1 tablet (10 mg total) by mouth daily. 90 tablet 3   ezetimibe (ZETIA) 10 MG tablet TAKE 1 TABLET EVERY DAY 90 tablet 1   Fish Oil-Cholecalciferol (FISH OIL + D3 PO) Take 1 tablet by mouth daily.     glipiZIDE (GLUCOTROL) 10 MG tablet 10 mg PO Q a.m and 5 mg Q p.m 135 tablet 3   hydrochlorothiazide (HYDRODIURIL) 25 MG tablet Take 1 tablet (25 mg total) by mouth daily. 90 tablet 3   insulin glargine (LANTUS) 100 UNIT/ML injection Inject 0.27 mLs (27 Units total) into the skin at bedtime. 30 mL 11   Insulin Syringe-Needle U-100 (TRUEPLUS INSULIN SYRINGE) 30G X 5/16" 0.5 ML MISC UAD 100 each 11   losartan (COZAAR) 100 MG tablet TAKE 1 TABLET EVERY DAY 90 tablet 3   MAGNESIUM PO Take 1 tablet by mouth daily.     metFORMIN (GLUCOPHAGE) 1000 MG tablet TAKE 1 TABLET (1,000 MG TOTAL) BY MOUTH 2 (TWO) TIMES DAILY. 180 tablet 3   metoprolol succinate (TOPROL-XL) 100 MG 24 hr tablet Take 1 tablet (100 mg total) by mouth daily. Take with or immediately following a meal. 90 tablet 1   metoprolol succinate (TOPROL-XL) 50 MG 24 hr tablet Take 1 tablet (50 mg total) by mouth daily. Take with or  immediately following a meal, take in the evening. 90 tablet 1   Multiple Vitamin (MULTIVITAMIN PO) Take 1 tablet by mouth daily.     nitroGLYCERIN (NITROSTAT) 0.4 MG SL tablet Place 1 tablet (0.4 mg total) under the tongue every 5 (five) minutes x 3 doses as needed for chest pain. 25 tablet 1   ticagrelor (BRILINTA) 90 MG TABS tablet Take 1 tablet (90 mg total) by mouth 2 (two) times daily. 180 tablet 3   No current facility-administered medications on file prior to visit.    Allergies  Allergen Reactions   Trazodone And Nefazodone Other (See Comments)    Makes her feel jittery    Social History   Socioeconomic History   Marital status:  Married    Spouse name: Not on file   Number of children: 1   Years of education: Not on file   Highest education level: GED or equivalent  Occupational History   Occupation: retired  Tobacco Use   Smoking status: Former    Current packs/day: 0.00    Average packs/day: 1 pack/day for 27.0 years (27.0 ttl pk-yrs)    Types: Cigarettes    Start date: 05/30/1967    Quit date: 05/29/1994    Years since quitting: 29.1   Smokeless tobacco: Never  Vaping Use   Vaping status: Never Used  Substance and Sexual Activity   Alcohol use: No   Drug use: Never   Sexual activity: Yes    Birth control/protection: Post-menopausal, None  Other Topics Concern   Not on file  Social History Narrative   Not on file   Social Drivers of Health   Financial Resource Strain: Low Risk  (07/19/2023)   Overall Financial Resource Strain (CARDIA)    Difficulty of Paying Living Expenses: Not hard at all  Food Insecurity: No Food Insecurity (07/19/2023)   Hunger Vital Sign    Worried About Running Out of Food in the Last Year: Never true    Ran Out of Food in the Last Year: Never true  Transportation Needs: No Transportation Needs (07/19/2023)   PRAPARE - Administrator, Civil Service (Medical): No    Lack of Transportation (Non-Medical): No  Physical  Activity: Insufficiently Active (07/19/2023)   Exercise Vital Sign    Days of Exercise per Week: 4 days    Minutes of Exercise per Session: 30 min  Stress: No Stress Concern Present (07/19/2023)   [employer] of Occupational Health - Occupational Stress Questionnaire    Feeling of Stress : Only a little  Social Connections: Moderately Isolated (07/19/2023)   Social Connection and Isolation Panel [NHANES]    Frequency of Communication with Friends and Family: Once a week    Frequency of Social Gatherings with Friends and Family: Never    Attends Religious Services: More than 4 times per year    Active Member of [employer] or Organizations: No    Attends Banker Meetings: Never    Marital Status: Married  Catering manager Violence: Not At Risk (08/21/2022)   Humiliation, Afraid, Rape, and Kick questionnaire    Fear of Current or Ex-Partner: No    Emotionally Abused: No    Physically Abused: No    Sexually Abused: No    Family History  Problem Relation Age of Onset   Pancreatic cancer Mother    Diabetes Mother    Heart attack Father    Diabetes Brother    Liver disease Brother        drinker   Hypertension Brother    Hypertension Brother    HIV/AIDS Brother    Breast cancer Maternal Aunt    Rectal cancer Neg Hx    Colon cancer Neg Hx    Esophageal cancer Neg Hx     Past Surgical History:  Procedure Laterality Date   CHOLECYSTECTOMY     CORONARY ANGIOPLASTY WITH STENT PLACEMENT     CORONARY STENT INTERVENTION N/A 08/25/2021   Procedure: CORONARY STENT INTERVENTION;  Surgeon: Corky Crafts, MD;  Location: [facility] INVASIVE CV LAB;  Service: Cardiovascular;  Laterality: N/A;   CORONARY ULTRASOUND/IVUS N/A 08/25/2021   Procedure: Intravascular Ultrasound/IVUS;  Surgeon: Corky Crafts, MD;  Location: [facility] INVASIVE CV LAB;  Service: Cardiovascular;  Laterality: N/A;   LEFT HEART CATH AND CORONARY ANGIOGRAPHY N/A 08/25/2021   Procedure: LEFT HEART CATH AND  CORONARY ANGIOGRAPHY;  Surgeon: Corky Crafts, MD;  Location: [facility] INVASIVE CV LAB;  Service: Cardiovascular;  Laterality: N/A;   TONSILLECTOMY      ROS: Review of Systems Negative except as stated above  PHYSICAL EXAM: BP 126/75   Pulse 73   Temp 97.8 F (36.6 C) (Oral)   Ht 5\' 3"  (1.6 m)   Wt 179 lb (81.2 kg)   SpO2 98%   BMI 31.71 kg/m   Wt Readings from Last 3 Encounters:  07/23/23 179 lb (81.2 kg)  04/12/23 190 lb 1.6 oz (86.2 kg)  03/19/23 184 lb (83.5 kg)    Physical Exam  General appearance - alert, well appearing, and in no distress Mental status - normal mood, behavior, speech, dress, motor activity, and thought processes Neck - supple, no significant adenopathy Nose: No enlargement of nasal turbinates. Chest - clear to auscultation, no wheezes, rales or rhonchi, symmetric air entry Heart - normal rate, regular rhythm, normal S1, S2, no murmurs, rubs, clicks or gallops Extremities - peripheral pulses normal, no pedal edema, no clubbing or cyanosis      Latest Ref Rng & Units 03/19/2023   10:17 AM 08/26/2021    2:29 AM 08/25/2021    5:23 AM  CMP  Glucose 70 - 99 mg/dL 829  562  130   BUN 8 - 27 mg/dL 17  17  15    Creatinine 0.57 - 1.00 mg/dL 8.65  7.84  6.96   Sodium 134 - 144 mmol/L 135  134  135   Potassium 3.5 - 5.2 mmol/L 4.6  3.7  3.7   Chloride 96 - 106 mmol/L 97  100  102   CO2 20 - 29 mmol/L 22  26  25    Calcium 8.7 - 10.3 mg/dL 9.7  9.2  9.4   Total Protein 6.0 - 8.5 g/dL 7.7     Total Bilirubin 0.0 - 1.2 mg/dL 0.4     Alkaline Phos 44 - 121 IU/L 110     AST 0 - 40 IU/L 24     ALT 0 - 32 IU/L 20      Lipid Panel     Component Value Date/Time   CHOL 88 (L) 03/19/2023 1017   TRIG 115 03/19/2023 1017   HDL 34 (L) 03/19/2023 1017   CHOLHDL 2.6 03/19/2023 1017   CHOLHDL 5.1 10/06/2016 0333   VLDL 31 10/06/2016 0333   LDLCALC 33 03/19/2023 1017    CBC    Component Value Date/Time   WBC 8.5 03/19/2023 1017   WBC 6.8 08/26/2021 0229    RBC 4.21 03/19/2023 1017   RBC 4.02 08/26/2021 0229   HGB 9.7 (L) 03/19/2023 1017   HCT 33.1 (L) 03/19/2023 1017   PLT 437 03/19/2023 1017   MCV 79 03/19/2023 1017   MCH 23.0 (L) 03/19/2023 1017   MCH 30.3 08/26/2021 0229   MCHC 29.3 (L) 03/19/2023 1017   MCHC 34.2 08/26/2021 0229   RDW 18.6 (H) 03/19/2023 1017   LYMPHSABS 2.4 08/23/2021 0343   MONOABS 0.8 08/23/2021 0343   EOSABS 0.3 08/23/2021 0343   BASOSABS 0.0 08/23/2021 0343   Lab Results  Component Value Date   IRON 22 (L) 03/19/2023   TIBC 435 03/19/2023   FERRITIN 13 (L) 03/19/2023    ASSESSMENT AND PLAN: 1. Type 2 diabetes mellitus with obesity (HCC) (Primary) A1c is close  to goal and has improved from previous.  She will continue glargine insulin 27 units daily, metformin 1 g twice a day, glipizide 10 mg in the a.m. and 5 mg in the p.m. -Advised patient that the next time her CGM alerts her to low blood sugars in the middle of the night, she should check manually to make sure that the reading is correct and if so eat something to bring the blood sugars back up.  If indeed these are true readings and occurring often, she should let me know as we would need to adjust the dose of her insulin or decrease the glipizide to 10 mg once a day. - POCT glycosylated hemoglobin (Hb A1C) - POCT glucose (manual entry)  2. Insulin long-term use (HCC) 3. Diabetes mellitus treated with oral medication (HCC) See #1 above.  4. Hypertension associated with diabetes (HCC) At goal.  Continue amlodipine 10 mg daily, HCTZ 25 mg daily, metoprolol 100 mg in the morning/50 mg at night and Cozaar.  5. Coronary artery disease involving native coronary artery of native heart without angina pectoris Stable.  Continue aspirin, Brilinta, atorvastatin and Zetia.  6. Seasonal allergies Recommend that she use the Flonase daily for 1 week and then after that as needed.  Continue Claritin  7. Iron deficiency anemia, unspecified iron deficiency  anemia type New undiscovered 4 months ago.  She is taking the iron supplement daily.  We will recheck levels today.  Advised that we should refer her to gastroenterology to evaluate further.  She is up-to-date with colonoscopy.   - CBC - Iron, TIBC and Ferritin Panel  8. Paroxysmal tachycardia (HCC) On metoprolol.     Patient was given the opportunity to ask questions.  Patient verbalized understanding of the plan and was able to repeat key elements of the plan.   This documentation was completed using Paediatric nurse.  Any transcriptional errors are unintentional.  Orders Placed This Encounter  Procedures   CBC   Iron, TIBC and Ferritin Panel   POCT glycosylated hemoglobin (Hb A1C)   POCT glucose (manual entry)     Requested Prescriptions    No prescriptions requested or ordered in this encounter    Return in about 4 months (around 11/20/2023) for Medicare Wellness Visit in  2  wks with CMA.  Jonah Blue, MD, FACP

## 2023-07-24 LAB — IRON,TIBC AND FERRITIN PANEL
Ferritin: 47 ng/mL (ref 15–150)
Iron Saturation: 10 % — ABNORMAL LOW (ref 15–55)
Iron: 38 ug/dL (ref 27–139)
Total Iron Binding Capacity: 376 ug/dL (ref 250–450)
UIBC: 338 ug/dL (ref 118–369)

## 2023-07-24 LAB — CBC
Hematocrit: 38.3 % (ref 34.0–46.6)
Hemoglobin: 12.7 g/dL (ref 11.1–15.9)
MCH: 29 pg (ref 26.6–33.0)
MCHC: 33.2 g/dL (ref 31.5–35.7)
MCV: 87 fL (ref 79–97)
Platelets: 376 10*3/uL (ref 150–450)
RBC: 4.38 x10E6/uL (ref 3.77–5.28)
RDW: 14.1 % (ref 11.7–15.4)
WBC: 8.3 10*3/uL (ref 3.4–10.8)

## 2023-07-25 ENCOUNTER — Encounter: Payer: Self-pay | Admitting: Internal Medicine

## 2023-07-25 DIAGNOSIS — Z809 Family history of malignant neoplasm, unspecified: Secondary | ICD-10-CM | POA: Diagnosis not present

## 2023-07-25 DIAGNOSIS — Z7984 Long term (current) use of oral hypoglycemic drugs: Secondary | ICD-10-CM | POA: Diagnosis not present

## 2023-07-25 DIAGNOSIS — N393 Stress incontinence (female) (male): Secondary | ICD-10-CM | POA: Diagnosis not present

## 2023-07-25 DIAGNOSIS — E1136 Type 2 diabetes mellitus with diabetic cataract: Secondary | ICD-10-CM | POA: Diagnosis not present

## 2023-07-25 DIAGNOSIS — E669 Obesity, unspecified: Secondary | ICD-10-CM | POA: Diagnosis not present

## 2023-07-25 DIAGNOSIS — E1122 Type 2 diabetes mellitus with diabetic chronic kidney disease: Secondary | ICD-10-CM | POA: Diagnosis not present

## 2023-07-25 DIAGNOSIS — J309 Allergic rhinitis, unspecified: Secondary | ICD-10-CM | POA: Diagnosis not present

## 2023-07-25 DIAGNOSIS — E1151 Type 2 diabetes mellitus with diabetic peripheral angiopathy without gangrene: Secondary | ICD-10-CM | POA: Diagnosis not present

## 2023-07-25 DIAGNOSIS — Z8673 Personal history of transient ischemic attack (TIA), and cerebral infarction without residual deficits: Secondary | ICD-10-CM | POA: Diagnosis not present

## 2023-07-25 DIAGNOSIS — I25119 Atherosclerotic heart disease of native coronary artery with unspecified angina pectoris: Secondary | ICD-10-CM | POA: Diagnosis not present

## 2023-07-25 DIAGNOSIS — G4733 Obstructive sleep apnea (adult) (pediatric): Secondary | ICD-10-CM | POA: Diagnosis not present

## 2023-07-25 DIAGNOSIS — I252 Old myocardial infarction: Secondary | ICD-10-CM | POA: Diagnosis not present

## 2023-07-25 DIAGNOSIS — Z823 Family history of stroke: Secondary | ICD-10-CM | POA: Diagnosis not present

## 2023-07-25 DIAGNOSIS — E785 Hyperlipidemia, unspecified: Secondary | ICD-10-CM | POA: Diagnosis not present

## 2023-07-25 DIAGNOSIS — Z794 Long term (current) use of insulin: Secondary | ICD-10-CM | POA: Diagnosis not present

## 2023-07-25 DIAGNOSIS — Z7982 Long term (current) use of aspirin: Secondary | ICD-10-CM | POA: Diagnosis not present

## 2023-07-25 DIAGNOSIS — N1831 Chronic kidney disease, stage 3a: Secondary | ICD-10-CM | POA: Diagnosis not present

## 2023-07-25 DIAGNOSIS — I479 Paroxysmal tachycardia, unspecified: Secondary | ICD-10-CM | POA: Diagnosis not present

## 2023-08-16 DIAGNOSIS — E1169 Type 2 diabetes mellitus with other specified complication: Secondary | ICD-10-CM | POA: Diagnosis not present

## 2023-08-25 DIAGNOSIS — Z794 Long term (current) use of insulin: Secondary | ICD-10-CM | POA: Diagnosis not present

## 2023-08-25 DIAGNOSIS — I10 Essential (primary) hypertension: Secondary | ICD-10-CM | POA: Diagnosis not present

## 2023-08-27 DIAGNOSIS — I10 Essential (primary) hypertension: Secondary | ICD-10-CM | POA: Diagnosis not present

## 2023-08-27 DIAGNOSIS — Z794 Long term (current) use of insulin: Secondary | ICD-10-CM | POA: Diagnosis not present

## 2023-09-10 ENCOUNTER — Encounter: Payer: Self-pay | Admitting: Internal Medicine

## 2023-09-10 DIAGNOSIS — E1159 Type 2 diabetes mellitus with other circulatory complications: Secondary | ICD-10-CM

## 2023-09-10 MED ORDER — DAPAGLIFLOZIN PROPANEDIOL 10 MG PO TABS
10.0000 mg | ORAL_TABLET | Freq: Every day | ORAL | 1 refills | Status: DC
Start: 2023-09-10 — End: 2023-09-12

## 2023-09-12 ENCOUNTER — Other Ambulatory Visit: Payer: Self-pay | Admitting: Internal Medicine

## 2023-09-12 ENCOUNTER — Other Ambulatory Visit: Payer: Self-pay | Admitting: Pharmacist

## 2023-09-12 DIAGNOSIS — Z794 Long term (current) use of insulin: Secondary | ICD-10-CM

## 2023-09-12 MED ORDER — DAPAGLIFLOZIN PROPANEDIOL 10 MG PO TABS
10.0000 mg | ORAL_TABLET | Freq: Every day | ORAL | 1 refills | Status: DC
Start: 2023-09-12 — End: 2023-09-12

## 2023-09-12 MED ORDER — FARXIGA 10 MG PO TABS
10.0000 mg | ORAL_TABLET | Freq: Every day | ORAL | 1 refills | Status: DC
Start: 2023-09-12 — End: 2024-04-16

## 2023-09-15 DIAGNOSIS — E1169 Type 2 diabetes mellitus with other specified complication: Secondary | ICD-10-CM | POA: Diagnosis not present

## 2023-09-24 ENCOUNTER — Other Ambulatory Visit: Payer: Self-pay

## 2023-09-24 DIAGNOSIS — Z794 Long term (current) use of insulin: Secondary | ICD-10-CM | POA: Diagnosis not present

## 2023-09-24 DIAGNOSIS — I10 Essential (primary) hypertension: Secondary | ICD-10-CM | POA: Diagnosis not present

## 2023-09-26 DIAGNOSIS — I10 Essential (primary) hypertension: Secondary | ICD-10-CM | POA: Diagnosis not present

## 2023-09-26 DIAGNOSIS — Z794 Long term (current) use of insulin: Secondary | ICD-10-CM | POA: Diagnosis not present

## 2023-10-15 DIAGNOSIS — E1169 Type 2 diabetes mellitus with other specified complication: Secondary | ICD-10-CM | POA: Diagnosis not present

## 2023-10-17 ENCOUNTER — Other Ambulatory Visit (HOSPITAL_BASED_OUTPATIENT_CLINIC_OR_DEPARTMENT_OTHER): Payer: Self-pay | Admitting: Cardiology

## 2023-10-23 ENCOUNTER — Ambulatory Visit (HOSPITAL_BASED_OUTPATIENT_CLINIC_OR_DEPARTMENT_OTHER): Payer: Medicare PPO | Admitting: Cardiology

## 2023-10-23 ENCOUNTER — Encounter (HOSPITAL_BASED_OUTPATIENT_CLINIC_OR_DEPARTMENT_OTHER): Payer: Self-pay | Admitting: Cardiology

## 2023-10-23 VITALS — BP 136/74 | HR 71 | Ht 63.0 in | Wt 181.3 lb

## 2023-10-23 DIAGNOSIS — Z9861 Coronary angioplasty status: Secondary | ICD-10-CM | POA: Diagnosis not present

## 2023-10-23 DIAGNOSIS — E782 Mixed hyperlipidemia: Secondary | ICD-10-CM | POA: Diagnosis not present

## 2023-10-23 DIAGNOSIS — I252 Old myocardial infarction: Secondary | ICD-10-CM | POA: Diagnosis not present

## 2023-10-23 DIAGNOSIS — Z794 Long term (current) use of insulin: Secondary | ICD-10-CM

## 2023-10-23 DIAGNOSIS — I10 Essential (primary) hypertension: Secondary | ICD-10-CM | POA: Diagnosis not present

## 2023-10-23 DIAGNOSIS — Z7189 Other specified counseling: Secondary | ICD-10-CM

## 2023-10-23 DIAGNOSIS — E1159 Type 2 diabetes mellitus with other circulatory complications: Secondary | ICD-10-CM | POA: Diagnosis not present

## 2023-10-23 DIAGNOSIS — G4733 Obstructive sleep apnea (adult) (pediatric): Secondary | ICD-10-CM | POA: Diagnosis not present

## 2023-10-23 DIAGNOSIS — I2510 Atherosclerotic heart disease of native coronary artery without angina pectoris: Secondary | ICD-10-CM | POA: Diagnosis not present

## 2023-10-23 DIAGNOSIS — Z8673 Personal history of transient ischemic attack (TIA), and cerebral infarction without residual deficits: Secondary | ICD-10-CM

## 2023-10-23 NOTE — Patient Instructions (Signed)
 Medication Instructions:  Your physician recommends that you continue on your current medications as directed. Please refer to the Current Medication list given to you today.   Follow-Up: Please follow up in 12 months with Dr. Veryl Gottron, Slater Duncan, NP or Neomi Banks, NP

## 2023-10-23 NOTE — Progress Notes (Signed)
 Cardiology Office Note:  .   Date:  10/23/2023  ID:  Breanna Obrien, DOB 08/04/1955, MRN [id number] PCP: Lawrance Presume, MD   HeartCare Providers Cardiologist:  Sheryle Donning, MD {  History of Present Illness: .   Breanna Obrien is a 68 y.o. female with a hx of CAD s/p DES to LAD x 2 (2008 in [place] ) and NSTEMI s/p mid RCA DES (2023), CVA (09/2016), Bell's palsy, bilateral carotid artery stenosis, hypertension, diabetes type II, who is here for follow up today. I met her 12/19/21.   CV history: She had presented to the ED 08/23/21 and was found to have NSTEMI. Left heart cath revealed 90% stenosed mid RCA that was treated with DES x1. She had monitor for palpitations in 2023 which showed intermittent sinus tachycardia up to 145 bpm but otherwise no significant arrhythmias and infrequent ectopy (1.8% PACs).   Has history of CAD s/p DES to LAD x 2 ( 2008 in [place] ) and NSTEMI s/p mid RCA (07/2021), CVA (09/2016).  Has not tolerated mask for CPAP.   Today: Walking 1.25 miles 5 days/week. Working to increase this slowly. No chest pain, shortness of breath, or claudication.  Gets cramps in her calves and thighs at night when she is sleeping. Potassium is fine. Better with massaging the legs. Never happens with activity. Has mild residual nerve issues on her R side from prior CVA. Reviewed her prior ABIs, mildly reduced on R side, but she gets cramps bilaterally. Recommended she discuss with Dr. Lincoln Renshaw to see if there are any nerve etiologies/treatments she thinks might be helpful. She will also try compression socks at night to see if that helps the muscle cramps.  Palpitations/tachycardia has largely resolved. Has only had episodes of faster heart rates recently when her sugar was low. Blood pressure well controlled.   ROS: Denies chest pain, shortness of breath at rest or with normal exertion. No PND, orthopnea, LE edema or unexpected weight gain. No syncope. ROS otherwise  negative except as noted.   Studies Reviewed: Aaron Aas    EKG:  EKG Interpretation Date/Time:  Tuesday Oct 23 2023 08:12:58 EDT Ventricular Rate:  71 PR Interval:  194 QRS Duration:  100 QT Interval:  400 QTC Calculation: 434 R Axis:   2  Text Interpretation: Normal sinus rhythm Normal ECG When compared with ECG of 26-Aug-2021 05:16, No significant change was found Confirmed by Sheryle Donning [phone]) on 10/23/2023 8:43:41 AM    Physical Exam:   VS:  BP 136/74 (BP Location: Left Arm, Patient Position: Sitting, Cuff Size: Large)   Pulse 71   Ht 5\' 3"  (1.6 m)   Wt 181 lb 4.8 oz (82.2 kg)   SpO2 93%   BMI 32.12 kg/m    Wt Readings from Last 3 Encounters:  10/23/23 181 lb 4.8 oz (82.2 kg)  07/23/23 179 lb (81.2 kg)  04/12/23 190 lb 1.6 oz (86.2 kg)    GEN: Well nourished, well developed in no acute distress HEENT: Normal, moist mucous membranes NECK: No JVD CARDIAC: regular rhythm, normal S1 and S2, no rubs or gallops. No murmur. VASCULAR: Radial and DP pulses 2+ bilaterally. No carotid bruits RESPIRATORY:  Clear to auscultation without rales, wheezing or rhonchi  ABDOMEN: Soft, non-tender, non-distended MUSCULOSKELETAL:  Ambulates independently SKIN: Warm and dry, no edema NEUROLOGIC:  Alert and oriented x 3. Bells palsy L side PSYCHIATRIC:  Normal affect    ASSESSMENT AND PLAN: .    CAD s/p PCI History of NSTEMI History  of CVA Mixed hyperlipidemia -continue aspirin  and ticagrelor . Has stent in stent, would continue DAPT indefinitely -continue atorvastatin , ezetimibe . Would aim for aggressive control with LDL <55. Most recent LDL 33, due for recheck 02/2024, planned with Dr. Lincoln Renshaw -continue metoprolol  succinate 100 mg in the AM and 50 mg in the PM -lpa normal at 30.3 -no symptoms -reviewed red flag warning signs that need immediate medical attention   Hypertension -checks BP at home, runs 120s/70 at home. -continue amlodipine , HCTZ, metoprolol , losartan    Type  II diabetes -on metformin , insulin , glipizide  -on dapagliflozin  -last A1c 7.0  OSA: -doesn't tolerate CPAP nasal pillows well, too claustrophobic for mask, but still tries to manage. We discussed [employer], and she is interested in this. Will send message to Dr. Charl Concha office as she is overdue for follow up, to discuss if she is a good candidate for this.  CV risk counseling and prevention -recommend heart healthy/Mediterranean diet, with whole grains, fruits, vegetable, fish, lean meats, nuts, and olive oil. Limit salt. -recommend moderate walking, 3-5 times/week for 30-50 minutes each session. Aim for at least 150 minutes.week. Goal should be pace of 3 miles/hours, or walking 1.5 miles in 30 minutes -recommend avoidance of tobacco products. Avoid excess alcohol.  Dispo: 12 mos or sooner as needed  Signed, Sheryle Donning, MD   Sheryle Donning, MD, PhD, [facility]  [facility]  [place]  [facility] at [facility] at [facility] [address] [place], [place] [address] [phone]

## 2023-10-24 DIAGNOSIS — I10 Essential (primary) hypertension: Secondary | ICD-10-CM | POA: Diagnosis not present

## 2023-10-24 DIAGNOSIS — Z794 Long term (current) use of insulin: Secondary | ICD-10-CM | POA: Diagnosis not present

## 2023-10-27 DIAGNOSIS — Z794 Long term (current) use of insulin: Secondary | ICD-10-CM | POA: Diagnosis not present

## 2023-10-27 DIAGNOSIS — I10 Essential (primary) hypertension: Secondary | ICD-10-CM | POA: Diagnosis not present

## 2023-11-14 DIAGNOSIS — E1169 Type 2 diabetes mellitus with other specified complication: Secondary | ICD-10-CM | POA: Diagnosis not present

## 2023-11-19 ENCOUNTER — Other Ambulatory Visit: Payer: Self-pay

## 2023-11-19 ENCOUNTER — Other Ambulatory Visit (HOSPITAL_BASED_OUTPATIENT_CLINIC_OR_DEPARTMENT_OTHER): Payer: Self-pay | Admitting: Cardiology

## 2023-11-20 ENCOUNTER — Ambulatory Visit: Payer: Medicare PPO | Admitting: Internal Medicine

## 2023-11-20 ENCOUNTER — Ambulatory Visit

## 2023-11-23 DIAGNOSIS — I10 Essential (primary) hypertension: Secondary | ICD-10-CM | POA: Diagnosis not present

## 2023-11-23 DIAGNOSIS — Z794 Long term (current) use of insulin: Secondary | ICD-10-CM | POA: Diagnosis not present

## 2023-11-26 DIAGNOSIS — Z794 Long term (current) use of insulin: Secondary | ICD-10-CM | POA: Diagnosis not present

## 2023-11-26 DIAGNOSIS — I10 Essential (primary) hypertension: Secondary | ICD-10-CM | POA: Diagnosis not present

## 2023-11-27 ENCOUNTER — Encounter: Payer: Self-pay | Admitting: Internal Medicine

## 2023-11-28 ENCOUNTER — Other Ambulatory Visit: Payer: Self-pay | Admitting: Internal Medicine

## 2023-11-28 DIAGNOSIS — E1159 Type 2 diabetes mellitus with other circulatory complications: Secondary | ICD-10-CM

## 2023-11-28 MED ORDER — HYDROCHLOROTHIAZIDE 25 MG PO TABS
25.0000 mg | ORAL_TABLET | Freq: Every day | ORAL | 3 refills | Status: AC
Start: 2023-11-28 — End: ?

## 2023-11-29 ENCOUNTER — Other Ambulatory Visit: Payer: Self-pay | Admitting: Internal Medicine

## 2023-11-29 DIAGNOSIS — Z1231 Encounter for screening mammogram for malignant neoplasm of breast: Secondary | ICD-10-CM

## 2023-12-08 ENCOUNTER — Encounter: Payer: Self-pay | Admitting: Internal Medicine

## 2023-12-09 ENCOUNTER — Other Ambulatory Visit: Payer: Self-pay | Admitting: Internal Medicine

## 2023-12-09 DIAGNOSIS — Z794 Long term (current) use of insulin: Secondary | ICD-10-CM

## 2023-12-09 MED ORDER — METFORMIN HCL 1000 MG PO TABS
ORAL_TABLET | Freq: Two times a day (BID) | ORAL | 3 refills | Status: AC
Start: 2023-12-09 — End: 2024-12-08

## 2023-12-14 DIAGNOSIS — E1169 Type 2 diabetes mellitus with other specified complication: Secondary | ICD-10-CM | POA: Diagnosis not present

## 2023-12-23 DIAGNOSIS — I10 Essential (primary) hypertension: Secondary | ICD-10-CM | POA: Diagnosis not present

## 2023-12-23 DIAGNOSIS — E1165 Type 2 diabetes mellitus with hyperglycemia: Secondary | ICD-10-CM | POA: Diagnosis not present

## 2023-12-25 ENCOUNTER — Ambulatory Visit: Admitting: Internal Medicine

## 2023-12-27 DIAGNOSIS — E1165 Type 2 diabetes mellitus with hyperglycemia: Secondary | ICD-10-CM | POA: Diagnosis not present

## 2023-12-27 DIAGNOSIS — I10 Essential (primary) hypertension: Secondary | ICD-10-CM | POA: Diagnosis not present

## 2024-01-09 ENCOUNTER — Ambulatory Visit
Admission: RE | Admit: 2024-01-09 | Discharge: 2024-01-09 | Disposition: A | Discharge status: Home / Self Care | Source: Ambulatory Visit

## 2024-01-09 DIAGNOSIS — Z1231 Encounter for screening mammogram for malignant neoplasm of breast: Secondary | ICD-10-CM | POA: Diagnosis not present

## 2024-01-10 ENCOUNTER — Ambulatory Visit: Payer: Self-pay | Admitting: Internal Medicine

## 2024-01-13 DIAGNOSIS — E1169 Type 2 diabetes mellitus with other specified complication: Secondary | ICD-10-CM | POA: Diagnosis not present

## 2024-01-16 ENCOUNTER — Other Ambulatory Visit: Payer: Self-pay | Admitting: Internal Medicine

## 2024-01-22 DIAGNOSIS — E1165 Type 2 diabetes mellitus with hyperglycemia: Secondary | ICD-10-CM | POA: Diagnosis not present

## 2024-01-22 DIAGNOSIS — I10 Essential (primary) hypertension: Secondary | ICD-10-CM | POA: Diagnosis not present

## 2024-01-27 DIAGNOSIS — E1165 Type 2 diabetes mellitus with hyperglycemia: Secondary | ICD-10-CM | POA: Diagnosis not present

## 2024-01-27 DIAGNOSIS — I10 Essential (primary) hypertension: Secondary | ICD-10-CM | POA: Diagnosis not present

## 2024-01-29 ENCOUNTER — Other Ambulatory Visit: Payer: Self-pay

## 2024-02-01 ENCOUNTER — Ambulatory Visit: Admitting: Internal Medicine

## 2024-02-15 DIAGNOSIS — E1169 Type 2 diabetes mellitus with other specified complication: Secondary | ICD-10-CM | POA: Diagnosis not present

## 2024-02-21 ENCOUNTER — Telehealth: Payer: Self-pay | Admitting: Internal Medicine

## 2024-02-21 DIAGNOSIS — E1165 Type 2 diabetes mellitus with hyperglycemia: Secondary | ICD-10-CM | POA: Diagnosis not present

## 2024-02-21 DIAGNOSIS — I10 Essential (primary) hypertension: Secondary | ICD-10-CM | POA: Diagnosis not present

## 2024-02-21 NOTE — Telephone Encounter (Signed)
 Confirmed appt for 9/26

## 2024-02-22 ENCOUNTER — Encounter: Payer: Self-pay | Admitting: Internal Medicine

## 2024-02-22 ENCOUNTER — Ambulatory Visit: Attending: Internal Medicine | Admitting: Internal Medicine

## 2024-02-22 VITALS — BP 125/75 | HR 73 | Temp 97.9°F | Ht 63.0 in | Wt 172.0 lb

## 2024-02-22 DIAGNOSIS — Z7984 Long term (current) use of oral hypoglycemic drugs: Secondary | ICD-10-CM | POA: Diagnosis not present

## 2024-02-22 DIAGNOSIS — Z87891 Personal history of nicotine dependence: Secondary | ICD-10-CM

## 2024-02-22 DIAGNOSIS — D509 Iron deficiency anemia, unspecified: Secondary | ICD-10-CM

## 2024-02-22 DIAGNOSIS — Z794 Long term (current) use of insulin: Secondary | ICD-10-CM | POA: Diagnosis not present

## 2024-02-22 DIAGNOSIS — F4320 Adjustment disorder, unspecified: Secondary | ICD-10-CM | POA: Diagnosis not present

## 2024-02-22 DIAGNOSIS — E1159 Type 2 diabetes mellitus with other circulatory complications: Secondary | ICD-10-CM | POA: Diagnosis not present

## 2024-02-22 DIAGNOSIS — I152 Hypertension secondary to endocrine disorders: Secondary | ICD-10-CM

## 2024-02-22 DIAGNOSIS — E119 Type 2 diabetes mellitus without complications: Secondary | ICD-10-CM

## 2024-02-22 DIAGNOSIS — E1169 Type 2 diabetes mellitus with other specified complication: Secondary | ICD-10-CM | POA: Diagnosis not present

## 2024-02-22 DIAGNOSIS — Z79899 Other long term (current) drug therapy: Secondary | ICD-10-CM

## 2024-02-22 DIAGNOSIS — E785 Hyperlipidemia, unspecified: Secondary | ICD-10-CM

## 2024-02-22 DIAGNOSIS — Z634 Disappearance and death of family member: Secondary | ICD-10-CM

## 2024-02-22 DIAGNOSIS — I2510 Atherosclerotic heart disease of native coronary artery without angina pectoris: Secondary | ICD-10-CM

## 2024-02-22 LAB — POCT GLYCOSYLATED HEMOGLOBIN (HGB A1C): HbA1c, POC (controlled diabetic range): 6.9 % (ref 0.0–7.0)

## 2024-02-22 LAB — GLUCOSE, POCT (MANUAL RESULT ENTRY): POC Glucose: 230 mg/dL — AB (ref 70–99)

## 2024-02-22 MED ORDER — GLIPIZIDE 10 MG PO TABS
ORAL_TABLET | ORAL | 3 refills | Status: AC
Start: 2024-02-22 — End: ?

## 2024-02-22 NOTE — Patient Instructions (Signed)
  VISIT SUMMARY: Today, we reviewed your diabetes, hypertension, coronary artery disease, hyperlipidemia, and anemia. We discussed your recent blood sugar and blood pressure readings, and your current medications. We also talked about your eating habits and the recent loss of your husband. You are considering moving to be closer to family, and your son will be visiting next week to help you decide on your future living arrangements.  YOUR PLAN: -TYPE 2 DIABETES MELLITUS: Type 2 diabetes is a condition where your body does not use insulin  properly, leading to high blood sugar levels. Your A1c has improved to 6.9, and your blood glucose levels are generally well-controlled. Continue taking your current diabetes medications: glargine insulin  27 units, metformin  1000 mg twice daily, glipizide  10 mg in the morning and 5 mg in the evening, and Farxiga  10 mg daily. Keep monitoring your blood glucose levels with your continuous glucose monitor and make sure to take your medications as prescribed.  -HYPERTENSION: Hypertension is high blood pressure, which can lead to serious health problems if not managed. Your blood pressure readings are generally well-controlled. Continue taking your current antihypertensive medications: hydrochlorothiazide , metoprolol  100 mg in the morning and 50 mg in the evening, amlodipine  10 mg daily, and Cozaar  100 mg daily. Recheck your blood pressure at home regularly.  -CORONARY ARTERY DISEASE: Coronary artery disease is a condition where the blood vessels supplying your heart are narrowed or blocked. You have not had any recent chest pain, shortness of breath, or leg swelling. Continue taking your current medications: atorvastatin  40 mg daily, Zetia  10 mg daily, Brilinta , and aspirin . Follow up with your cardiologist as scheduled.  -HYPERLIPIDEMIA: Hyperlipidemia is having high levels of fats in your blood, which can increase your risk of heart disease. Continue taking your current  medications: atorvastatin  40 mg daily and Zetia  10 mg daily.  -ANEMIA: Anemia is a condition where you do not have enough healthy red blood cells to carry adequate oxygen to your body's tissues. We will order blood tests today to reassess your anemia status. Continue taking your iron supplements until we review your blood test results. I will send you a message with further instructions after reviewing the results.  INSTRUCTIONS: Please follow up with your cardiologist as scheduled. Recheck your blood pressure at home regularly. Continue monitoring your blood glucose levels with your continuous glucose monitor. We will send you a message with further instructions after reviewing your blood test results for anemia.                      Contains text generated by [employer]bridge.                                 Contains text generated by Abridge.

## 2024-02-22 NOTE — Progress Notes (Signed)
 Patient ID: Breanna Obrien, female    DOB: 03/19/1956  MRN: [id number]  CC: Diabetes (DM f/u. Med refill. /No questions / concerns/No to flu vax)   Subjective: Breanna Obrien is a 68 y.o. female who presents for chronic ds management. 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   Discussed the use of AI scribe software for clinical note transcription with the patient, who gave verbal consent to proceed.  History of Present Illness Breanna Obrien is a 68 year old female with diabetes and hypertension who presents for a follow-up visit.  She recently lost her husband and is considering moving to be closer to family. Her son is visiting next week, and she plans to spend some time in [place]  to decide on her future living arrangements.  DM: Her A1c decreased from 7.0 to 6.9. She is on glargine insulin  27 units, metformin  1000 mg twice daily, and glipizide  10 mg in the morning and 5 mg in the evening. She uses a continuous glucose monitor, noting a blood sugar of 204 mg/dL this morning, attributed to missing her metformin  and blood pressure medications the previous night. CGM shows BS TIR 72% of the time, high 26 % and low 2% of time in past 30 days. She has been struggling with her eating habits due to the recent passing of her husband, often eating irregularly and skipping meals. She is trying to get back on track with her diet.  HTN: managed with multiple medications including hydrochlorothiazide , metoprolol  100 mg in the morning and 50 mg in the evening, amlodipine  10 mg daily, and Cozaar  100 mg daily. Her blood pressure was 129/59 mmHg at home this morning. No chest pain, shortness of breath, or leg swelling.  IDA: Hb had nl on last visit and iron levels had  improved.she was advised via MyChart to continue iron for an additional 4 to 6 weeks and then stop.  However she continues to take iron supplements daily.  HM: She plans to get her flu shot at her outside pharmacy so that it is covered by insurance.  Willing to reschedule her Medicare wellness visit.    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 11/12/2020   Abnormal mammogram 01/15/2017   CAD S/P percutaneous coronary angioplasty 01/15/2017   Hx of Bell's palsy 01/15/2017   Insomnia due to medical condition 10/24/2016   Depression 10/24/2016   Type 2 diabetes mellitus with complication, without long-term current use of insulin  (HCC) 10/06/2016   Essential hypertension 10/06/2016   Ataxia    Hyperlipidemia    Bilateral carotid artery stenosis    CVA (cerebral vascular accident) (HCC) 10/05/2016   TIA (transient ischemic attack) 06/30/2016     Current Outpatient Medications on File Prior to Visit  Medication Sig Dispense Refill   Ascorbic Acid (VITAMIN C PO) Take 1 tablet by mouth daily.     aspirin   81 MG chewable tablet Chew 1 tablet (81 mg total) by mouth daily.     atorvastatin  (LIPITOR) 40 MG tablet Take 1 tablet (40 mg total) by mouth at bedtime. 90 tablet 3   calcium -vitamin D (OSCAL WITH D) 500-200 MG-UNIT tablet Take 1 tablet by mouth.     CHROMIUM PO Take 1 tablet by mouth daily.     ezetimibe  (ZETIA ) 10 MG tablet TAKE 1 TABLET EVERY DAY 90 tablet 3   FARXIGA  10 MG TABS tablet Take 1 tablet (10 mg total) by mouth daily. 90 tablet 1   Fish  Oil-Cholecalciferol (FISH OIL + D3 PO) Take 1 tablet by mouth daily.     glipiZIDE  (GLUCOTROL ) 10 MG tablet 10 mg PO Q a.m and 5 mg Q p.m 135 tablet 3   hydrochlorothiazide  (HYDRODIURIL ) 25 MG tablet Take 1 tablet (25 mg total) by mouth daily. 90 tablet 3   insulin  glargine (LANTUS ) 100 UNIT/ML injection Inject 0.27 mLs (27 Units total) into the skin at bedtime. 30 mL 11   Insulin  Syringe-Needle U-100 (TRUEPLUS INSULIN  SYRINGE) 30G X 5/16 0.5 ML MISC UAD 100 each 11   losartan  (COZAAR ) 100 MG tablet TAKE 1 TABLET EVERY DAY 90 tablet 3   MAGNESIUM  PO Take 1 tablet by mouth daily.     metFORMIN  (GLUCOPHAGE ) 1000 MG tablet TAKE 1 TABLET (1,000 MG TOTAL) BY MOUTH 2 (TWO) TIMES DAILY. 180 tablet 3   metoprolol  succinate (TOPROL -XL) 100 MG 24 hr tablet TAKE 1 TABLET BY MOUTH ONCE DAILY, TAKE WITH OR IMMEDIATELY FOLLOWING A MEAL 90 tablet 3   metoprolol  succinate (TOPROL -XL) 50 MG 24 hr tablet Take 1 tablet (50 mg total) by mouth daily. Take with or immediately following a meal, take in the evening. (Patient taking differently: Take 50 mg by mouth at bedtime. Take with or immediately following a meal, take in the evening.) 90 tablet 1   Multiple Vitamin (MULTIVITAMIN PO) Take 1 tablet by mouth daily.     nitroGLYCERIN  (NITROSTAT ) 0.4 MG SL tablet Place 1 tablet (0.4 mg total) under the tongue every 5 (five) minutes x 3 doses as needed for chest pain. 25 tablet 1   ticagrelor  (BRILINTA ) 90 MG TABS tablet Take 1 tablet (90 mg total) by mouth 2 (two) times daily. 180 tablet 3   amLODipine  (NORVASC ) 10 MG tablet TAKE 1 TABLET (10 MG TOTAL) BY MOUTH DAILY. 90 tablet 3   No current facility-administered medications on file prior to visit.    Allergies  Allergen Reactions   Trazodone  And Nefazodone Other (See Comments)    Makes her feel jittery    Social History   Socioeconomic History   Marital status: Married    Spouse name: Not on file   Number of children: 1   Years of education: Not on file    Highest education level: GED or equivalent  Occupational History   Occupation: retired  Tobacco Use   Smoking status: Former    Current packs/day: 0.00    Average packs/day: 1 pack/day for 27.0 years (27.0 ttl pk-yrs)    Types: Cigarettes    Start date: 05/30/1967    Quit date: 05/29/1994    Years since quitting: 29.7   Smokeless tobacco: Never  Vaping Use   Vaping status: Never Used  Substance and Sexual Activity   Alcohol use: No   Drug use: Never   Sexual activity: Yes    Birth control/protection: Post-menopausal, None  Other Topics Concern   Not on file  Social History Narrative   Not on file   Social Drivers of Health   Financial Resource Strain: Medium Risk (11/16/2023)   Overall Financial Resource Strain (CARDIA)    Difficulty of Paying Living Expenses: Somewhat hard  Food Insecurity: No Food Insecurity (11/16/2023)   Hunger Vital Sign    Worried About Running Out of Food in the Last Year: Never true    Ran Out of Food in the Last Year: Never true  Transportation Needs: No Transportation Needs (11/16/2023)   PRAPARE - Administrator, Civil Service (Medical): No    Lack of Transportation (Non-Medical): No  Physical Activity: Sufficiently Active (11/16/2023)   Exercise Vital Sign    Days of Exercise per Week: 5 days    Minutes of Exercise per Session: 30 min  Stress: No Stress Concern Present (11/16/2023)   [employer] of Occupational Health - Occupational Stress Questionnaire    Feeling of Stress: Only a little  Social Connections: Moderately Isolated (11/16/2023)   Social Connection and Isolation Panel    Frequency of Communication with Friends and Family: Once a week    Frequency of Social Gatherings with Friends and Family: Never    Attends Religious Services: More than 4 times per year    Active Member of [employer] or Organizations: No    Attends Banker Meetings: Not on file    Marital Status: Married  Catering manager Violence: Not At  Risk (08/21/2022)   Humiliation, Afraid, Rape, and Kick questionnaire    Fear of Current or Ex-Partner: No    Emotionally Abused: No    Physically Abused: No    Sexually Abused: No    Family History  Problem Relation Age of Onset   Pancreatic cancer Mother    Diabetes Mother    Heart attack Father    Diabetes Brother    Liver disease Brother        drinker   Hypertension Brother    Hypertension Brother    HIV/AIDS Brother    Breast cancer Maternal Aunt    Rectal cancer Neg Hx    Colon cancer Neg Hx    Esophageal cancer Neg Hx     Past Surgical History:  Procedure Laterality Date   CHOLECYSTECTOMY     CORONARY ANGIOPLASTY WITH STENT PLACEMENT     CORONARY STENT INTERVENTION N/A 08/25/2021   Procedure: CORONARY STENT INTERVENTION;  Surgeon: Dann Candyce RAMAN, MD;  Location: [facility] INVASIVE CV LAB;  Service: Cardiovascular;  Laterality: N/A;   CORONARY ULTRASOUND/IVUS N/A 08/25/2021   Procedure: Intravascular Ultrasound/IVUS;  Surgeon: Dann Candyce RAMAN, MD;  Location: [facility] INVASIVE CV LAB;  Service: Cardiovascular;  Laterality: N/A;   LEFT HEART CATH AND CORONARY ANGIOGRAPHY N/A 08/25/2021   Procedure: LEFT HEART CATH AND CORONARY ANGIOGRAPHY;  Surgeon: Dann Candyce RAMAN, MD;  Location: [facility] INVASIVE CV LAB;  Service: Cardiovascular;  Laterality: N/A;   TONSILLECTOMY      ROS: Review of Systems Negative except as stated above  PHYSICAL EXAM: BP 138/74 (BP Location: Left Arm, Patient Position: Sitting, Cuff Size: Normal)   Pulse 73   Temp 97.9 F (36.6 C) (Oral)   Ht 5' 3 (1.6 m)   Wt 172 lb (78 kg)   SpO2 99%   BMI 30.47 kg/m   Wt Readings from Last 3 Encounters:  02/22/24 172 lb (78 kg)  10/23/23 181 lb 4.8 oz (82.2 kg)  07/23/23 179 lb (81.2 kg)    Physical Exam  General appearance -  alert, well appearing, older Hispanic female and in no distress Mental status - normal mood, behavior, speech, dress, motor activity, and thought processes Neck - supple, no  significant adenopathy Chest - clear to auscultation, no wheezes, rales or rhonchi, symmetric air entry Heart - normal rate, regular rhythm, normal S1, S2, no murmurs, rubs, clicks or gallops Extremities - peripheral pulses normal, no pedal edema, no clubbing or cyanosis      Latest Ref Rng & Units 03/19/2023   10:17 AM 08/26/2021    2:29 AM 08/25/2021    5:23 AM  CMP  Glucose 70 - 99 mg/dL 865  874  878   BUN 8 - 27 mg/dL 17  17  15    Creatinine 0.57 - 1.00 mg/dL 8.92  8.88  9.07   Sodium 134 - 144 mmol/L 135  134  135   Potassium 3.5 - 5.2 mmol/L 4.6  3.7  3.7   Chloride 96 - 106 mmol/L 97  100  102   CO2 20 - 29 mmol/L 22  26  25    Calcium  8.7 - 10.3 mg/dL 9.7  9.2  9.4   Total Protein 6.0 - 8.5 g/dL 7.7     Total Bilirubin 0.0 - 1.2 mg/dL 0.4     Alkaline Phos 44 - 121 IU/L 110     AST 0 - 40 IU/L 24     ALT 0 - 32 IU/L 20      Lipid Panel     Component Value Date/Time   CHOL 88 (L) 03/19/2023 1017   TRIG 115 03/19/2023 1017   HDL 34 (L) 03/19/2023 1017   CHOLHDL 2.6 03/19/2023 1017   CHOLHDL 5.1 10/06/2016 0333   VLDL 31 10/06/2016 0333   LDLCALC 33 03/19/2023 1017    CBC    Component Value Date/Time   WBC 8.3 07/23/2023 1219   WBC 6.8 08/26/2021 0229   RBC 4.38 07/23/2023 1219   RBC 4.02 08/26/2021 0229   HGB 12.7 07/23/2023 1219   HCT 38.3 07/23/2023 1219   PLT 376 07/23/2023 1219   MCV 87 07/23/2023 1219   MCH 29.0 07/23/2023 1219   MCH 30.3 08/26/2021 0229   MCHC 33.2 07/23/2023 1219   MCHC 34.2 08/26/2021 0229   RDW 14.1 07/23/2023 1219   LYMPHSABS 2.4 08/23/2021 0343   MONOABS 0.8 08/23/2021 0343   EOSABS 0.3 08/23/2021 0343   BASOSABS 0.0 08/23/2021 0343   Lab Results  Component Value Date   IRON 38 07/23/2023   TIBC 376 07/23/2023   FERRITIN 47 07/23/2023    ASSESSMENT AND PLAN: 1. Controlled type 2 diabetes mellitus with other circulatory complication, with long-term current use of insulin  (Primary) At goal. Continue glargine insulin  27  units daily, metformin  1 g twice a day, Farxiga  10 mg daily and glipizide  10 mg a.m./5 mg p.m. Patient plans to get back on track with her eating habits - POCT glucose (manual entry) - POCT glycosylated hemoglobin (Hb A1C) - glipiZIDE  (GLUCOTROL ) 10 MG tablet; 10 mg PO Q a.m and 5 mg Q p.m  Dispense: 135 tablet; Refill: 3 - Microalbumin / creatinine urine ratio; Future - CBC - Comprehensive metabolic panel with GFR  2. Diabetes mellitus treated with oral medication (HCC) See #1 above  3. Hypertension associated with diabetes (HCC) At goal. Continue amlodipine  10 mg daily, HCTZ 25 mg daily, metoprolol  100 mg in the morning/50 mg at night and Cozaar .   4. Hyperlipidemia associated with type 2 diabetes mellitus (HCC) Last LDL  was at goal.  Continue atorvastatin  40 mg daily and Zetia  10 mg daily - Lipid panel  5. Coronary artery disease involving native coronary artery of native heart without angina pectoris Stable. Continue aspirin , Brilinta , atorvastatin  and Zetia .   6. Iron deficiency anemia, unspecified iron deficiency anemia type Recheck CBC and iron studies today further recommendation with her to continue iron supplement. - Iron, TIBC and Ferritin Panel  7. Bereavement reaction I extended my condolences to Ms. Holtzclaw and support her visiting with her son for support at this difficult time.   Patient was given the opportunity to ask questions.  Patient verbalized understanding of the plan and was able to repeat key elements of the plan.   This documentation was completed using Paediatric nurse.  Any transcriptional errors are unintentional.  Orders Placed This Encounter  Procedures   POCT glucose (manual entry)   POCT glycosylated hemoglobin (Hb A1C)     Requested Prescriptions   Pending Prescriptions Disp Refills   glipiZIDE  (GLUCOTROL ) 10 MG tablet 135 tablet 3    Sig: 10 mg PO Q a.m and 5 mg Q p.m    No follow-ups on file.  Barnie Louder,  MD, FACP

## 2024-02-23 ENCOUNTER — Ambulatory Visit: Payer: Self-pay | Admitting: Internal Medicine

## 2024-02-23 LAB — CBC
Hematocrit: 38.7 % (ref 34.0–46.6)
Hemoglobin: 12.5 g/dL (ref 11.1–15.9)
MCH: 30.6 pg (ref 26.6–33.0)
MCHC: 32.3 g/dL (ref 31.5–35.7)
MCV: 95 fL (ref 79–97)
Platelets: 342 x10E3/uL (ref 150–450)
RBC: 4.08 x10E6/uL (ref 3.77–5.28)
RDW: 13.2 % (ref 11.7–15.4)
WBC: 7.9 x10E3/uL (ref 3.4–10.8)

## 2024-02-23 LAB — COMPREHENSIVE METABOLIC PANEL WITH GFR
ALT: 24 IU/L (ref 0–32)
AST: 24 IU/L (ref 0–40)
Albumin: 4.5 g/dL (ref 3.9–4.9)
Alkaline Phosphatase: 103 IU/L (ref 49–135)
BUN/Creatinine Ratio: 19 (ref 12–28)
BUN: 18 mg/dL (ref 8–27)
Bilirubin Total: 0.3 mg/dL (ref 0.0–1.2)
CO2: 23 mmol/L (ref 20–29)
Calcium: 10 mg/dL (ref 8.7–10.3)
Chloride: 96 mmol/L (ref 96–106)
Creatinine, Ser: 0.96 mg/dL (ref 0.57–1.00)
Globulin, Total: 3 g/dL (ref 1.5–4.5)
Glucose: 208 mg/dL — ABNORMAL HIGH (ref 70–99)
Potassium: 4.8 mmol/L (ref 3.5–5.2)
Sodium: 136 mmol/L (ref 134–144)
Total Protein: 7.5 g/dL (ref 6.0–8.5)
eGFR: 65 mL/min/1.73 (ref >59)

## 2024-02-23 LAB — LIPID PANEL
Chol/HDL Ratio: 2.4 ratio (ref 0.0–4.4)
Cholesterol, Total: 104 mg/dL (ref 100–199)
HDL: 43 mg/dL (ref >39)
LDL Chol Calc (NIH): 43 mg/dL (ref 0–99)
Triglycerides: 96 mg/dL (ref 0–149)
VLDL Cholesterol Cal: 18 mg/dL (ref 5–40)

## 2024-02-23 LAB — IRON,TIBC AND FERRITIN PANEL
Ferritin: 48 ng/mL (ref 15–150)
Iron Saturation: 15 % (ref 15–55)
Iron: 55 ug/dL (ref 27–139)
Total Iron Binding Capacity: 368 ug/dL (ref 250–450)
UIBC: 313 ug/dL (ref 118–369)

## 2024-02-25 ENCOUNTER — Other Ambulatory Visit: Payer: Self-pay | Admitting: Internal Medicine

## 2024-02-25 ENCOUNTER — Encounter: Payer: Self-pay | Admitting: Internal Medicine

## 2024-02-25 DIAGNOSIS — E1159 Type 2 diabetes mellitus with other circulatory complications: Secondary | ICD-10-CM

## 2024-02-25 DIAGNOSIS — E1169 Type 2 diabetes mellitus with other specified complication: Secondary | ICD-10-CM

## 2024-02-28 ENCOUNTER — Other Ambulatory Visit (HOSPITAL_BASED_OUTPATIENT_CLINIC_OR_DEPARTMENT_OTHER): Payer: Self-pay | Admitting: Cardiology

## 2024-03-03 ENCOUNTER — Other Ambulatory Visit: Payer: Self-pay

## 2024-03-10 ENCOUNTER — Other Ambulatory Visit: Payer: Self-pay | Admitting: Internal Medicine

## 2024-03-11 ENCOUNTER — Telehealth: Payer: Self-pay | Admitting: Internal Medicine

## 2024-03-11 ENCOUNTER — Other Ambulatory Visit: Payer: Self-pay | Admitting: Internal Medicine

## 2024-03-11 DIAGNOSIS — E1159 Type 2 diabetes mellitus with other circulatory complications: Secondary | ICD-10-CM

## 2024-03-11 NOTE — Telephone Encounter (Signed)
 Copied from CRM [id number]. Topic: General - Other >> Mar 11, 2024  4:46 PM Wess RAMAN wrote:  Reason for CRM: Patient missed call from [place], [place]. She stated she does not need medical records. She was just mailing a letter letting the provider know her husband passed away.  Call dropped. Attempted to call back twice, no answer.  Callback #: [phone]

## 2024-03-11 NOTE — Telephone Encounter (Signed)
 Noted, called patient, did not answer. Left a voicemail for patient to call back.   When patient calls back, Please inform patient they need to come into the office to complete a Release of Information form, Once the form completed the medical records department can send the requested documents.

## 2024-03-11 NOTE — Telephone Encounter (Signed)
 Copied from CRM [id number]. Topic: Medical Record Request - Provider/Facility Request >> Mar 11, 2024 11:07 AM Carlatta H wrote:  Reason for CRM: Needs up to date chart notes//Fax to [phone]

## 2024-03-13 NOTE — Telephone Encounter (Signed)
 OV notes and signed form faxed back to [facility] on 03/13/2024. Fax #: [phone].

## 2024-03-13 NOTE — Telephone Encounter (Signed)
 Copied from CRM [id number]. Topic: General - Call Back - No Documentation >> Mar 13, 2024  3:48 PM Olam RAMAN wrote:  Reason for CRM: CB [phone] Carlie from [facility]. rcvd glucose order, needs most recent chart to be sent to fax: [phone]

## 2024-03-14 ENCOUNTER — Ambulatory Visit: Admitting: Internal Medicine

## 2024-03-16 DIAGNOSIS — E1169 Type 2 diabetes mellitus with other specified complication: Secondary | ICD-10-CM | POA: Diagnosis not present

## 2024-03-19 ENCOUNTER — Telehealth: Payer: Self-pay | Admitting: Internal Medicine

## 2024-03-19 NOTE — Telephone Encounter (Signed)
 Let pt know I received request from [facility] requesting information for her to get the Freestyle Libre Plus CGM. However pt had told me she does not want the [place] any more and we had sent in form for Dexcom instead. Did she get the DexcoM?

## 2024-03-19 NOTE — Telephone Encounter (Signed)
 Called & spoke to the patient. Verified name & DOB. Patient stated that she recently spoke to [facility] and declined the [facility] and is still declining the [employer]. [facility] informed patient that they received the Dexcom CGM prescription yesterday 03/18/2024 and will be able to deliver to the patient next month. Patient is awaiting their delivery.

## 2024-03-20 ENCOUNTER — Ambulatory Visit: Admitting: Internal Medicine

## 2024-03-25 ENCOUNTER — Ambulatory Visit

## 2024-04-03 ENCOUNTER — Other Ambulatory Visit: Payer: Self-pay

## 2024-04-15 DIAGNOSIS — E1159 Type 2 diabetes mellitus with other circulatory complications: Secondary | ICD-10-CM | POA: Diagnosis not present

## 2024-04-16 ENCOUNTER — Other Ambulatory Visit: Payer: Self-pay | Admitting: Pharmacist

## 2024-04-16 ENCOUNTER — Encounter: Payer: Self-pay | Admitting: Internal Medicine

## 2024-04-16 ENCOUNTER — Other Ambulatory Visit: Payer: Self-pay

## 2024-04-16 DIAGNOSIS — E1159 Type 2 diabetes mellitus with other circulatory complications: Secondary | ICD-10-CM

## 2024-04-16 MED ORDER — FARXIGA 10 MG PO TABS: 10.0000 mg | ORAL_TABLET | Freq: Every day | ORAL | 1 refills | Status: AC

## 2024-04-29 ENCOUNTER — Other Ambulatory Visit: Payer: Self-pay

## 2024-05-12 ENCOUNTER — Other Ambulatory Visit: Payer: Self-pay

## 2024-06-17 LAB — OPHTHALMOLOGY REPORT-SCANNED

## 2024-06-23 ENCOUNTER — Ambulatory Visit: Admitting: Internal Medicine

## 2024-07-17 ENCOUNTER — Ambulatory Visit: Admitting: Internal Medicine
